# Patient Record
Sex: Female | Born: 1985
Health system: Southern US, Community
[De-identification: ages and names within clinical notes are randomized; demographics above are authoritative.]

## PROBLEM LIST (undated history)

## (undated) ENCOUNTER — Inpatient Hospital Stay (HOSPITAL_COMMUNITY): Payer: Self-pay

## (undated) DIAGNOSIS — J189 Pneumonia, unspecified organism: Secondary | ICD-10-CM

## (undated) DIAGNOSIS — G43909 Migraine, unspecified, not intractable, without status migrainosus: Secondary | ICD-10-CM

## (undated) DIAGNOSIS — B2790 Infectious mononucleosis, unspecified without complication: Secondary | ICD-10-CM

## (undated) DIAGNOSIS — Z8489 Family history of other specified conditions: Secondary | ICD-10-CM

## (undated) DIAGNOSIS — R42 Dizziness and giddiness: Secondary | ICD-10-CM

## (undated) DIAGNOSIS — M4307 Spondylolysis, lumbosacral region: Secondary | ICD-10-CM

## (undated) DIAGNOSIS — Z8619 Personal history of other infectious and parasitic diseases: Secondary | ICD-10-CM

## (undated) DIAGNOSIS — M3500 Sicca syndrome, unspecified: Secondary | ICD-10-CM

## (undated) DIAGNOSIS — K122 Cellulitis and abscess of mouth: Secondary | ICD-10-CM

## (undated) DIAGNOSIS — Z8659 Personal history of other mental and behavioral disorders: Secondary | ICD-10-CM

## (undated) DIAGNOSIS — E041 Nontoxic single thyroid nodule: Principal | ICD-10-CM

## (undated) DIAGNOSIS — S32009A Unspecified fracture of unspecified lumbar vertebra, initial encounter for closed fracture: Secondary | ICD-10-CM

## (undated) HISTORY — DX: Dizziness and giddiness: R42

## (undated) HISTORY — DX: Nontoxic single thyroid nodule: E04.1

## (undated) HISTORY — DX: Migraine, unspecified, not intractable, without status migrainosus: G43.909

## (undated) HISTORY — DX: Sicca syndrome, unspecified: M35.00

## (undated) HISTORY — DX: Personal history of other mental and behavioral disorders: Z86.59

## (undated) HISTORY — DX: Spondylolysis, lumbosacral region: M43.07

## (undated) HISTORY — DX: Personal history of other infectious and parasitic diseases: Z86.19

---
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
## 2006-01-13 ENCOUNTER — Encounter: Admission: RE | Admit: 2006-01-13 | Discharge: 2006-01-13 | Payer: Self-pay | Admitting: Sports Medicine

## 2006-03-30 HISTORY — PX: WISDOM TOOTH EXTRACTION: SHX21

## 2006-10-10 ENCOUNTER — Emergency Department (HOSPITAL_COMMUNITY): Admission: EM | Admit: 2006-10-10 | Discharge: 2006-10-10 | Payer: Self-pay | Admitting: Emergency Medicine

## 2008-04-07 ENCOUNTER — Emergency Department (HOSPITAL_COMMUNITY): Admission: EM | Admit: 2008-04-07 | Discharge: 2008-04-07 | Payer: Self-pay | Admitting: Emergency Medicine

## 2010-04-20 ENCOUNTER — Encounter: Payer: Self-pay | Admitting: Sports Medicine

## 2010-07-14 LAB — URINE MICROSCOPIC-ADD ON

## 2010-07-14 LAB — URINALYSIS, ROUTINE W REFLEX MICROSCOPIC
Bilirubin Urine: NEGATIVE
Glucose, UA: NEGATIVE mg/dL
Ketones, ur: >80 mg/dL — AB
Nitrite: POSITIVE — AB
Protein, ur: NEGATIVE mg/dL
Specific Gravity, Urine: 1.018 (ref 1.005–1.030)
Urobilinogen, UA: 0.2 mg/dL (ref 0.0–1.0)
pH: 5.5 (ref 5.0–8.0)

## 2010-07-14 LAB — URINE CULTURE: Colony Count: >=100000

## 2010-07-14 LAB — PREGNANCY, URINE: Preg Test, Ur: NEGATIVE

## 2011-01-13 LAB — DIFFERENTIAL
Basophils Absolute: 0
Basophils Relative: 0
Eosinophils Absolute: 0
Eosinophils Relative: 0
Lymphocytes Relative: 18
Lymphs Abs: 1.1
Monocytes Absolute: 0.6
Monocytes Relative: 9
Neutro Abs: 4.4
Neutrophils Relative %: 72

## 2011-01-13 LAB — CBC
HCT: 39.3
Hemoglobin: 13.8
MCHC: 35.3
MCV: 91.8
Platelets: 196
RBC: 4.28
RDW: 12.1
WBC: 6.1

## 2011-12-09 ENCOUNTER — Ambulatory Visit (INDEPENDENT_AMBULATORY_CARE_PROVIDER_SITE_OTHER): Payer: Self-pay | Admitting: Family Medicine

## 2011-12-09 ENCOUNTER — Encounter: Payer: Self-pay | Admitting: Family Medicine

## 2011-12-09 VITALS — BP 140/95 | HR 98 | Ht 68.0 in | Wt 135.0 lb

## 2011-12-09 DIAGNOSIS — F329 Major depressive disorder, single episode, unspecified: Secondary | ICD-10-CM | POA: Insufficient documentation

## 2011-12-09 DIAGNOSIS — R5383 Other fatigue: Secondary | ICD-10-CM

## 2011-12-09 DIAGNOSIS — F419 Anxiety disorder, unspecified: Secondary | ICD-10-CM

## 2011-12-09 DIAGNOSIS — F411 Generalized anxiety disorder: Secondary | ICD-10-CM

## 2011-12-09 NOTE — Progress Notes (Signed)
  Subjective:    Patient ID: Samantha Norman, female    DOB: 19-Aug-1985, 26 y.o.   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  In the past 2 weeks, has been increasingly anxious.  She attributes most of it with family conflict.  She moved back near her family in [place] in May after being away for school.  Also in May, broke up with a long term boyfriend- states being single is new for her.  In the past week, has been eating very little- she estimates 500 calories per day due to poor appetite.  Still drinking, good urine output.  Reports no trouble sleeping, energy enough to continue with school responsibilities but becomes very fatigued when she gets home.  Also endorses feelings of feeling short of breath at times.  Attributed this to poor conditioning and did intense aerobic exercise routine for a while which she had no trouble with performing, but still feels tired at times when doing simple activities such as walking up a flight of stairs.  Denies  Insomnia.  School performance has not been affected.     Review of Systems Denies SI, history of substance abuse, eating disorder, physical abuse.  I have reviewed patient's  PMH, FH, and Social history and Medications as related to this visit. No significant family history of diagnosed substance abuse or psychiatric illness    Objective:   Physical Exam GEN: Alert & Oriented, No acute distress, thin CV:  Regular Rate & Rhythm, no murmur Respiratory:  Normal work of breathing, CTAB Abd:  + BS, soft, no tenderness to palpation Ext: no pre-tibial edema Psych: anxious appearing, easily blushes, decreased eye contact.  Linear thought processes.   PHQ-9: 7,  Highest for  for poor appetite and feeling down MDQ: 1, irritability GAD-7:  7: highest for feeling nervous  or on edge      Assessment & Plan:

## 2011-12-09 NOTE — Assessment & Plan Note (Addendum)
GAD without panic attacks. Standardized questionnaire (GAD, PHQ) appears to be less severe than how she presents today.   Will check CBC and TSH to evaluate symptoms of dyspnea and fatigue, I expect to be normal.    Discussed combination of therapy and medications as beneficial and gave her options for how she would like to proceed.  She opts for therapy as she feels adding coping skills to deal with the stressors in her life would be helpful.  I agreed and also encouraged this.  We discussed setting a goal for continuing daily exercise and to bring lunch and eat some every day.  Invited her to return if she has further questions or decided would like medication therapy.

## 2011-12-09 NOTE — Patient Instructions (Addendum)
Call Dr. Pascal Lux to schedule an appointment for therapy.  Use daily exercise as stress relief  Your goal is to bring a PBJ daily to work and take at least a few bites for mid day energy  Please return to discuss any concerns!

## 2011-12-10 LAB — POCT HEMOGLOBIN: Hemoglobin: 15 g/dL (ref 12.2–16.2)

## 2011-12-10 LAB — TSH: TSH: 1.418 u[IU]/mL (ref 0.350–4.500)

## 2011-12-10 NOTE — Addendum Note (Signed)
Addended by: [place], Lovena Kluck on: 12/10/2011 05:16 PM   Modules accepted: Orders

## 2011-12-10 NOTE — Addendum Note (Signed)
Addended by: Macy Mis on: 12/10/2011 11:11 AM   Modules accepted: Orders

## 2011-12-11 ENCOUNTER — Telehealth: Payer: Self-pay | Admitting: Psychology

## 2011-12-11 NOTE — Telephone Encounter (Signed)
Samantha Norman called to request an appointment.  She needs afternoons.  First available that matched hers and my schedule was:  10/24 at 4:00.  Scheduled for then.  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  She is not concerned about anyone that might see her coming into my office.  Will discuss further at her appointment.

## 2011-12-22 ENCOUNTER — Ambulatory Visit (INDEPENDENT_AMBULATORY_CARE_PROVIDER_SITE_OTHER): Payer: PRIVATE HEALTH INSURANCE | Admitting: Psychology

## 2011-12-22 DIAGNOSIS — F4322 Adjustment disorder with anxiety: Secondary | ICD-10-CM

## 2011-12-22 NOTE — Patient Instructions (Addendum)
The main thing we discussed today (other than background) was limits and boundaries with regards to relationships.  Your homework is to make a list of ground rules for healthy relationships.  I would specifically consider the relationship with your mother as you are creating this list.

## 2011-12-23 ENCOUNTER — Encounter: Payer: Self-pay | Admitting: Psychology

## 2011-12-23 NOTE — Progress Notes (Signed)
Samantha Norman presented for an initial psychological assessment.  A client information sheet detailing the Behavioral Medicine Service was provided.  The patient voiced an understanding of what was detailed on this sheet including the issue of confidentiality and the limits thereof.  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    Presenting Problem: Samantha Norman presents with anxiety and "stress" mostly around family issues.  She is distressed to the point of her school/work function being affected.  She has a decreased appetite and increased fatigue.  She does not note sleep disturbance.  The thing that bothers her most might be difficulty with attention and concentration.    Relevant Medical History: Reviewed medical record.  She has no reported medical problems.  Relevant Psychiatric / Psychological History: Denied previous treatment of any kind.  Family History: Parents were married and had Samantha Norman (25) and her younger brother Samantha Norman, 8).  Samantha Norman is a Samantha Norman at [employer].  Parents divorced when Samantha Norman was 68.  She was a "daddy's girl" who had relationship issues with her mother, especially in her adolescent years.  The divorce did not help these issues.  Dad remarried.  Currently works in [place] but his wife lives in [place].  He splits his time.  He does not engage with Samantha Norman unless prompted by her.  He is not current on her life and does not seem to understand or care that this matters to [place].  Yoshi's mom lives in [place] and works from home.  She wants to talk to [place] everyday and would like to move with [place] if / when she changes location after graduation.    Did not assess presence of mental health issues in the family.  History of Abuse: Did not assess formally.  No reported history.  Education /  Occupation: Graduated HS in [employer].  [employer] for [employer].  In pharmacy school due to graduate with her doctorate this May. Wants a pharm residency.  Thinking about this is stressful.  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    Substance Use: Denies use of substances with the exception of social drinking one to two times a month.  When function decreased, started to drink coffee in the morning.  This boosts her mood.  Will also drink in the afternoon if she is having trouble concentrating.  If already feeling anxious, tends to make things worse.    Other: Ended six year relationship in May with Samantha Norman.  Started dating him shortly after parents' separation.  Focusing on him was a good way to not focus on her parents.  He was controlling and often put her down.  She feels good about ending the relationship.  She knows it was the right thing to do.  Her growth and development through clinical rotations was what helped give her the confidence.  Being single and not having him as a "barrier" to her dysfunctional family dynamics has proved tough for her.

## 2011-12-23 NOTE — Assessment & Plan Note (Signed)
Samantha Norman is neatly groomed and appropriately dressed.  She is cooperative and attentive.  Speech is normal in tone, rate and rhythm.  Mood is reported as "stressed" with an appropriate affect.  She blushes regularly and occasionally has trouble with eye contact but otherwise does not display significant signs of anxiety.  Thought process is logical and goal directed.  No evidence of suicidal or homicidal ideation.  Able to maintain train of thought and concentrate on the questions.  Judgment and insight are average to above.    I don't think she meets criteria for major depression or generalized anxiety disorder.  I think the closest thing in an adjustment disorder with anxious features (and maybe some depressive symptoms as well).  The big precipitants appear to be the break-up with her boyfriend (that she thinks really helped cushion her from her family dynamics) and the move to [place] where she has more contact with her mother.    Based on what she told me, the family dynamics are significantly dysfunctional with her and her brother potentially operating as the most mature and healthy individuals.  I suspect there will be a certain amount of grief from the growing realization that she is not likely to get what she needs from her parents.  There may also be some relief from understanding that it is not her fault.  This understanding may take time.  I suspect it will be important for her to do a piece of work and then take a break.  Mother-daughter issues tend to be chronic and she will need support in managing them over time.  For the time being, we decided to focus on a better understanding of what to expect in healthy, well-fucntioning relationships.    See patient instructions for further details.

## 2012-01-04 ENCOUNTER — Ambulatory Visit (INDEPENDENT_AMBULATORY_CARE_PROVIDER_SITE_OTHER): Payer: PRIVATE HEALTH INSURANCE | Admitting: Psychology

## 2012-01-04 DIAGNOSIS — F4322 Adjustment disorder with anxiety: Secondary | ICD-10-CM

## 2012-01-05 NOTE — Assessment & Plan Note (Addendum)
Mood is reported as sad and anxious.  Affect appears consistent.  Sleep is disturbed with about 5 1/2 to 6 1/2  hours per night (bed around 10:00 or 11:00 and up around 4:30).  This is a decrease and she reports not missing the sleep.  Appetite remains decreased.  She will eat when with others but not by herself.  Did not have time to explore this further.  Does not sound like she is purposefully restricting.    Despite her report of sad and anxious mood, she had a very busy weekend full of social activities.  She also reports she did almost twice her normal work today and doesn't quite understand how she was able to do it so quickly.    Denied frank suicidal ideation but it sounds like there might be some passive thoughts that are disturbing to her.  She states safety is not an issue.  I am concerned about distress tolerance.  Worked a lot with catastrophic thinking and tolerance of emotional distress.  Education about the difference between thoughts and feelings and the coping statement:  Just because it feels bad doesn't make it so.  Her interactions with her mother feel bad and therefore she thinks she [place]) is bad (worthless).  Filled out portion of thought record and asked her to use it for coping.  Other coping we identified included accepting invitations for social events.  Despite not wanting to go out with friends this past weekend, she did and she felt better.    Her report today, especially the sleep and continued eating issues, make me think more about the role of medicine.  Will continue to monitor.

## 2012-01-05 NOTE — Progress Notes (Signed)
Samantha Norman presented for follow-up.  She reports a difficult week with two particular bad times on Thursday and Sunday.  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  She stated that it wasn't about the workload but more about feeling not worthy of continuing in the program.  She spoke with Dr. Raymondo Band about it and was reassured somewhat.  Yesterday (Sunday), she had an interview with [employer] that she was dreading.  She went to the interview and started feeling really anxious.  After, she was scheduled to have dinner with her mother.  At dinner, she discussed the process for getting her mother's name removed from her bank account.  Her mother became tearful and Samantha Norman reports it was "awful."  Her mother requested to go back to [facility] house to "see the dog" and the conversation continued.  Her mother said that she plans to move to [place] - something she told Samantha Norman's brother about two weeks ago.  Samantha Norman is confused by this information (and how / when it was delivered) in multiple ways.  Samantha Norman is feeling guilty about her dog.  She is at the work about 12 hours a day and she thinks her dog is not getting enough time and attention.  She expects her work schedule to be at least this time consuming for the next two months.  She is reluctant to hire dog-care help due to cost.  The dog does bring some comfort and joy.  She thinks she would feel very sad if she were to find the dog another home but does not know for sure if that is the case.

## 2012-01-12 ENCOUNTER — Ambulatory Visit (INDEPENDENT_AMBULATORY_CARE_PROVIDER_SITE_OTHER): Payer: PRIVATE HEALTH INSURANCE | Admitting: Psychology

## 2012-01-12 DIAGNOSIS — F4322 Adjustment disorder with anxiety: Secondary | ICD-10-CM

## 2012-01-12 NOTE — Patient Instructions (Addendum)
Please schedule a follow-up for:  October 23rd at 4:00. We talked about a couple of options regarding your relationship with your mom.  Withdrawing from the relationship is an option but may not be the best starting point.  Crafting a healthier relationship with her (if possible) would likely be better in the long-term. Please consider both the pros and cons of truth-telling with her.  This means that when you get that angst about what to share - you opt for the truth.  This is not the same as telling her everything.   Also continue with your thought record.  Nice job this past week.

## 2012-01-13 NOTE — Progress Notes (Signed)
Samantha Norman presents for follow-up.  She wants to discuss how she can navigate conversations with her mother.  She also wanted to state that she thinks she has learned how to better separate out her feelings of worthlessness related to her family from her school / professional life.  She thinks she was letting these bleed together and she is working on separating these things.    We did a brief review of thoughts and feelings and she shared her one scenario from her homework.  Her thought record detailed strong anxiety about a Engineer, manufacturing.  She identified the thoughts fueling the anxiety (I am not smart enough, he is going to pick this apart) and challenged them with more realistic thoughts.  This brought her anxiety down from about a 6 to very minimal.  Detailed more conversations with her mother and the dysfunction inherent in their relationship.  Discussed options for moving forward.

## 2012-01-13 NOTE — Assessment & Plan Note (Addendum)
Her assessment that she did her homework "wrong" was interesting especially because she did it EXACTLY like it is designed to be done.  Will be watchful for a pattern here.  The tool was effective and if used steadily, should be a nice coping resource for her to have long-term.  Continues to struggle with assuming responsibility for other people's feelings (i.e. Her mother).  Discussed at length and highlighted potential spots for limits.  She is starting to recognize them herself.    She currently is keeping several things from her mother that make the relationship more tricky.  Discussed options of disengaging from her all together (seemingly attractive in the short-term but not likely a good long-term strategy).  The other option is to give her mother an opportunity for a more authentic relationship.  We discussed truth telling coupled with boundary setting.  She seemed interested in this approach and understandably wary.  Also discussed the energy that this kind of work takes and the idea that she gets to decide whether now is the time to engage.    See patient instructions for further plan.

## 2012-01-20 ENCOUNTER — Ambulatory Visit (INDEPENDENT_AMBULATORY_CARE_PROVIDER_SITE_OTHER): Payer: PRIVATE HEALTH INSURANCE | Admitting: Psychology

## 2012-01-20 DIAGNOSIS — F329 Major depressive disorder, single episode, unspecified: Secondary | ICD-10-CM

## 2012-01-20 NOTE — Patient Instructions (Addendum)
Please schedule a follow-up for: October 29th at 4:00. The basic assumption we discussed today that you need to repeat until it starts to stick is:  I am good enough.  Your mind right now is telling you all kinds of untruths about your worth.  Your mind is powerful which means you can use it to get to a better place. You identified eating more as a way to ensure that your thinking gets better.  You agreed to eat at least two meals a day and aim for 1500 calories.  A protein shake might be a way to get in additional calories.  Use your thought record around eating to address faulty cognitions. Taking a break from relating to your mother right now seems like a healthy approach to manage how you are feeling.  I am so sorry she was unable to give you what you needed. Refrain from making any major decisions right now. BE GENTLE WITH YOURSELF.  Treat yourself the way you would treat a friend who is hurting that you love very, very much.

## 2012-01-21 ENCOUNTER — Encounter: Payer: Self-pay | Admitting: Psychology

## 2012-01-21 NOTE — Progress Notes (Signed)
Samantha Norman presents for follow-up.  She reports it has been a tough week.  In addition, she just left a supervisor's office.  She had been getting frustrated and "snapped" saying something she thinks was not professional.    She had dinner with her mother again and although she wasn't really intending to, she ending up doing some truth telling.  She reported that this did not go well.  She kept a thought record on the situation and her feelings and we reviewed this.    She reported she continues to lose weight.  She thinks she has lost about 15 pounds since September.  She is 125 pounds and is 5'7".  She has looked up her BMI and she is still within the normal range but close to being underweight.  She reports her weight loss is a combination of not feeling like eating and feeling like not eating is a way to feel in control.  She does not think she needs to lose weight.  She does believe that her lack of nutrition is contributing to her distorted thinking and emotional reactivity.    Sleep remains disturbed.  Feels like she hasn't slept well in a long time.  Sleep requirement is around 7-8 and she is getting significantly less.  She awakens frequently during the night and then is usually up around 4:30 for good.    Denies active suicidal ideation, intent or plan.  Passive thoughts are present.  No history of suicide attempts.

## 2012-01-21 NOTE — Assessment & Plan Note (Signed)
Original diagnosis was adjustment disorder.  Symptoms are more consistent with major depressive disorder at this point.  She has loss of appetite with significant weight loss, early morning awakenings, feelings of guilt and worthlessness, impaired concentration, sad mood and passive suicidal ideation.   This would be the first episode for her - no prior treatment history.  No substance abuse issues.  No clear signs of mania or hypomania.  She continues to do fine at work but from her report, she is likely functioning at a level lower than her baseline.  TSH normal in 11/2011.  At this point, medication might be helpful as an aid to treatment.  Will discuss with [name].  Eating issues are concerning because in addition of the loss of appetite, Victory recognizes power that comes from actively restricting.  Opted to give her as much power around this issue as possible.  See patient instructions for what she was willing to do with regards to eating.    Of note, we brainstormed how to handle the situation with her supervisor during our session.  She was able to enact the plan immediately.  She was concerned she would think about it too much if she did not resolve it.  It was a nice opportunity to work at the point of performance rather than a historical report.  Also of note, based on Karthika's report of her mother's behavior, I do not think her mother will be able to give Amnah what she needs (and deserves).  The communication and relationship dynamic sounds very dysfunctional and will likely prove hard for Neda to manage given her current state.  Jaquasia thought that "taking a break" from her bother was a reasonable and healthy approach to taking care of herself.  I agree.  See patient instructions for further plan.

## 2012-01-26 ENCOUNTER — Ambulatory Visit (INDEPENDENT_AMBULATORY_CARE_PROVIDER_SITE_OTHER): Payer: PRIVATE HEALTH INSURANCE | Admitting: Psychology

## 2012-01-26 DIAGNOSIS — F329 Major depressive disorder, single episode, unspecified: Secondary | ICD-10-CM

## 2012-01-26 NOTE — Progress Notes (Deleted)
Psychiatric Assessment Adult  Patient Identification:  Samantha Norman Date of Evaluation:  01/26/2012 Chief Complaint: *** History of Chief Complaint:  No chief complaint on file.   HPI Review of Systems Physical Exam  Depressive Symptoms: {DEPRESSION SYMPTOMS:20000}  (Hypo) Manic Symptoms:   Elevated Mood:  {BHH YES OR NO:22294} Irritable Mood:  {BHH YES OR NO:22294} Grandiosity:  {BHH YES OR NO:22294} Distractibility:  {BHH YES OR NO:22294} Labiality of Mood:  {BHH YES OR NO:22294} Delusions:  {BHH YES OR NO:22294} Hallucinations:  {BHH YES OR NO:22294} Impulsivity:  {BHH YES OR NO:22294} Sexually Inappropriate Behavior:  {BHH YES OR NO:22294} Financial Extravagance:  {BHH YES OR NO:22294} Flight of Ideas:  {BHH YES OR NO:22294}  Anxiety Symptoms: Excessive Worry:  {BHH YES OR NO:22294} Panic Symptoms:  {BHH YES OR NO:22294} Agoraphobia:  {BHH YES OR NO:22294} Obsessive Compulsive: {BHH YES OR NO:22294}  Symptoms: {Obsessive Compulsive Symptoms:22671} Specific Phobias:  {BHH YES OR NO:22294} Social Anxiety:  {BHH YES OR NO:22294}  Psychotic Symptoms:  Hallucinations: {BHH YES OR NO:22294} {Hallucinations:22672} Delusions:  {BHH YES OR NO:22294} Paranoia:  {BHH YES OR NO:22294}   Ideas of Reference:  {BHH YES OR NO:22294}  PTSD Symptoms: Ever had a traumatic exposure:  {BHH YES OR NO:22294} Had a traumatic exposure in the last month:  {BHH YES OR NO:22294} Re-experiencing: {BHH YES OR NO:22294} {Re-experiencing:22673} Hypervigilance:  {BHH YES OR NO:22294} Hyperarousal: {BHH YES OR NO:22294} {Hyperarousal:22674} Avoidance: {BHH YES OR NO:22294} {Avoidance:22675}  Traumatic Brain Injury: {BHH YES OR NO:22294} {Traumatic Brain Injury:22676}  Past Psychiatric History: Diagnosis: ***  Hospitalizations: ***  Outpatient Care: ***  Substance Abuse Care: ***  Self-Mutilation: ***  Suicidal Attempts: ***  Violent Behaviors: ***   Past Medical History:  No past  medical history on file. History of Loss of Consciousness:  {BHH YES OR NO:22294} Seizure History:  {BHH YES OR NO:22294} Cardiac History:  {BHH YES OR NO:22294} Allergies:  No Known Allergies Current Medications:  Current Outpatient Prescriptions  Medication Sig Dispense Refill  . norethindrone-ethinyl estradiol 1/35 (NECON 1/35, 28,) tablet Take 1 tablet by mouth daily.        Previous Psychotropic Medications:  Medication Dose   ***  ***                     Substance Abuse History in the last 12 months: Substance Age of 1st Use Last Use Amount Specific Type  Nicotine  ***  ***  ***  ***  Alcohol  ***  ***  ***  ***  Cannabis  ***  ***  ***  ***  Opiates  ***  ***  ***  ***  Cocaine  ***  ***  ***  ***  Methamphetamines  ***  ***  ***  ***  LSD  ***  ***  ***  ***  Ecstasy  ***   ***  ***  ***  Benzodiazepines  ***  ***  ***  ***  Caffeine  ***  ***  ***  ***  Inhalants  ***  ***  ***  ***  Others:                          Medical Consequences of Substance Abuse: ***  Legal Consequences of Substance Abuse: ***  Family Consequences of Substance Abuse: ***  Blackouts:  {BHH YES OR NO:22294} DT's:  {BHH YES OR NO:22294} Withdrawal Symptoms:  {BHH YES OR NO:22294} {Withdrawal Symptoms:22677}  Social History: Current Place  of Residence: *** Place of Birth: *** Family Members: *** Marital Status:  {Marital Status:22678} Children: ***  Sons: ***  Daughters: *** Relationships: *** Education:  {Education:22679} Educational Problems/Performance: *** Religious Beliefs/Practices: *** History of Abuse: {Desc; abuse:16542} Occupational Experiences; Military History:  {Military History:22680} Legal History: *** Hobbies/Interests: ***  Family History:  No family history on file.  Mental Status Examination/Evaluation: Objective:  Appearance: {Appearance:22683}  Eye Contact::  {BHH EYE CONTACT:22684}  Speech:  {Speech:22685}  Volume:  {Volume (PAA):22686}    Mood:  ***  Affect:  {Affect (PAA):22687}  Thought Process:  {Thought Process (PAA):22688}  Orientation:  {BHH ORIENTATION (PAA):22689}  Thought Content:  {Thought Content:22690}  Suicidal Thoughts:  {ST/HT (PAA):22692}  Homicidal Thoughts:  {ST/HT (PAA):22692}  Judgement:  {Judgement (PAA):22694}  Insight:  {Insight (PAA):22695}  Psychomotor Activity:  {Psychomotor (PAA):22696}  Akathisia:  {BHH YES OR NO:22294}  Handed:  {Handed:22697}  AIMS (if indicated):  ***  Assets:  {Assets (PAA):22698}    Laboratory/X-Ray Psychological Evaluation(s)   ***  ***   Assessment:  {axis diagnosis:3049000}  AXIS I {psych axis 1:31909}  AXIS II {psych axis 2:31910}  AXIS III No past medical history on file.   AXIS IV {psych axis iv:31915}  AXIS V {psych axis v score:31919}   Treatment Plan/Recommendations:  Plan of Care: ***  Laboratory:  {Laboratory:22682}  Psychotherapy: ***  Medications: ***  Routine PRN Medications:  {BHH YES OR RU:04540}  Consultations: ***  Safety Concerns:  ***  OtherSpero Geralds, PsyD 10/29/20135:06 PM

## 2012-01-26 NOTE — Patient Instructions (Addendum)
Please schedule a follow-up for 12:00.  There will not be a 12:00 slot in the computer - they will have to put you in at a different time.   You agree that eating more regularly will help your brain function more properly.  There are lots of reasons you are not eating well now.  Taking the thinking out of it might be a good stategy.  Consider setting an alarm as a reminder.  Use a thought record to challenge the unhealthy, inaccurate thoughts you have around eating right now. We talked a lot about medicine.  Depressive cognitions are part of the deal and you have a bunch of really good ones.  Medicine would likely help clear up your thinking so that you could get to feeling more yourself faster.  This would be a good thing.

## 2012-01-26 NOTE — Assessment & Plan Note (Signed)
Report of mood is variable - more positive today but would say more than half the days are low mood.  Thoughts are consistent with depression.  Passive suicidal ideation is present.  No active SI.  Eating and sleep remain disturbed.  Attempted to stay rooted in motivational interviewing.  Did recommend medication as a way to clear up thoughts that would hopefully aid eating behavior as this is likely contributing to the distorted thinking.  Also looked to see what she could recommit to with regards to food intake.  See patient instructions for further plan.

## 2012-01-26 NOTE — Progress Notes (Signed)
Samantha Norman presents for follow-up.  The majority of the meeting was spent on discussing symptomatology and the pros and cons of initiating medication.  She is ambivalent.  Discussed thoughts behind this.  Weight remains an issue.  She was unable to follow through on her identified plan from last week.  She doesn't feel like eating but in addition to that, she has several faulty cognitions around this issue.  Had another meeting with her mom (mom initiated).  [place] has determined that it is best to steer clear for now.

## 2012-01-27 ENCOUNTER — Other Ambulatory Visit: Payer: Self-pay | Admitting: Family Medicine

## 2012-01-27 MED ORDER — SERTRALINE HCL 50 MG PO TABS: 50.0000 mg | ORAL_TABLET | Freq: Every day | ORAL | Status: DC

## 2012-01-27 NOTE — Telephone Encounter (Signed)
I discussed with Dr Pascal Lux. On the basis of Dr Briscoe's assessment I am starting Zoloft with follow up already scheduled with Dr Gwendolyn Grant next week. If this decreases appetite, Mirtazapine may be better due to her current anorexia. Hand written Rx since it didn't print.

## 2012-02-01 ENCOUNTER — Ambulatory Visit (INDEPENDENT_AMBULATORY_CARE_PROVIDER_SITE_OTHER): Payer: PRIVATE HEALTH INSURANCE | Admitting: Family Medicine

## 2012-02-01 ENCOUNTER — Encounter: Payer: Self-pay | Admitting: Family Medicine

## 2012-02-01 VITALS — BP 129/90 | HR 85 | Ht 68.0 in | Wt 122.0 lb

## 2012-02-01 DIAGNOSIS — F329 Major depressive disorder, single episode, unspecified: Secondary | ICD-10-CM

## 2012-02-01 NOTE — Patient Instructions (Addendum)
It was good to meet you today, thanks for coming in and talking with me.  As we discussed, I would continue with the Zoloft for the next couple of weeks to see how you do as long as the nausea and anxiety do not significantly worsen.    If you are having anxiety issues or feeling overwhelmed and cannot reach [facility] or Dr. Pascal Lux, you can also call me.  My number is [phone]. Come back and see either me or Dr. Earnest Bailey in about 10 days.

## 2012-02-02 ENCOUNTER — Encounter: Payer: Self-pay | Admitting: Family Medicine

## 2012-02-02 ENCOUNTER — Ambulatory Visit (INDEPENDENT_AMBULATORY_CARE_PROVIDER_SITE_OTHER): Payer: PRIVATE HEALTH INSURANCE | Admitting: Psychology

## 2012-02-02 DIAGNOSIS — F329 Major depressive disorder, single episode, unspecified: Secondary | ICD-10-CM

## 2012-02-02 NOTE — Assessment & Plan Note (Addendum)
Sounds like significant mood lability remains.  She is composed with me today.  Evident cognitive errors including all-or-none thinking.  Function remains reasonable although her work function is not as high as it normally is.  She is passing rotations however.  The structure work provides is likely helpful.  I think she has the potential to deteriorate further if not working.  She agreed with this.  Mood is reported as "better."  No evidence of SI / HI.    See patient instructions for further plan.  Also gave her a bibliotherapy resource on DBT.  Asked her to read the first chapter which is about distress tolerance through distraction and engaging in pleasurable activities.  There is also a piece in there about radical acceptance.

## 2012-02-02 NOTE — Patient Instructions (Addendum)
Please schedule a follow-up for: November 12th at 4:00. We talked about sleep, eating, structure and coping.   With regards to sleep - you agreed to practice sleep hygiene principles by getting out of bed when you are unable to sleep and reading in the other room until you feel sleepy.   With regards to eating - we talked about power and how you feel as you are losing weight.  You agreed to try external cues for eating (times of day) rather than  "wanting to eat" until your thinking gets a little clearer around this issue.  I am concerned about the direction you are heading and where you might end up.   Structure is good for people that are struggling. Work on a Environmental health practitioner for December.  Creating a calendar with activities for each day (not the entire day) is a good starting point.  A mix of pleasure stuff (even if it doesn't sound pleasurable) and productive stuff can be helpful. Coping.  You have the tools you need in order to manage your emotional distress.  Relying on others to rescue you might feel good in the short-term but it robs you of the opportunity to rely on yourself.  Social support is good.  Knowing you can cope is really good.  Thought record.  Thought record.  Thought record.  Feel free to email me your thought record if you think accountability would be helpful.

## 2012-02-02 NOTE — Progress Notes (Signed)
  Subjective:    Patient ID: Samantha Norman, female    DOB: 1985-05-06, 26 y.o.   MRN: [id number]  HPI  1.  FU for depression and adjustment disorder:  26 yo F with multiple recent life stressors including ending relationship with long-term boyfriend (6 years) and recently cutting herself off from her family who presents today for follow-up after starting anti-depressant medications.  Samantha Norman has been followed by both Dr. Raymondo Band -- her supervising pharmacist -- and Dr. Pascal Lux for adjustment disorder which seems to have become her first episode of major depression.  Main symptoms have been decreased eating and decreased sleep.  States she has not felt hungry which is main reason for not eating.  Able to fall asleep but awakens early (around 3:30 AM with difficulty falling back asleep).  She was recommended to start on Zoloft on Wednesday of last week.  She states this caught her by surprise as she had wanted to try and work through things on her own without medications.  However she started the Zoloft that day.  Began experiencing increased anxiety and nausea since starting Zoloft.  Had "major episode" of crying and anxiety on Thursday and again on Saturday. Has been doing better yesterday and today from mood perspective, but has had increasing nausea without any overt vomiting. This also now limiting her po food intake.  Good fluid intake on daily basis.  Able to eat a protein bar today, but nothing else.    Not much support system.  She admits that Drs. Pascal Lux and [place] and now her main supports.  Feels she is a "burden" on them as well as a burden on her long-term friends.  Feelings of being burden and lack of any real support since loss of boyfriend and no longer seeing her family seems to be another major stressor for her.    Denies any manic/hypomanic symptoms or any history of these.  No suicidal ideations.    Review of Systems See HPI above for review of systems.       Objective:   Physical  Exam Gen:  Alert, cooperative patient who appears stated age in no acute distress.  Well-dressed, not disheveled appearing.  Vital signs reviewed. Psych:  Depressed appearing at times, but also able to smile.  Linear and coherent thought process.  Not anxious appearing.          Assessment & Plan:

## 2012-02-02 NOTE — Assessment & Plan Note (Addendum)
Long discussion about where to go from here.   She reiterated that she wished she had been able to try getting through this episode without medications, but after much discussion and thinking, she is willing to try continuing the Sertraline for now. Less excited about switching to something to help with sleep/weight gain like Mirtazapine -- she feels that she did not have trouble sleeping or eating prior to this episode and that her depressive symptoms have triggered the lack of sleep/appetite and not vice versa.  Also feels that once she works through these symptoms her eating/sleeping with revert to what is normal for her.   Does admit to realization that decreased food/energy intake can limit cognitive decision making.   She seems to have had some worsening anxiety with initiation of Zoloft but this seems to be resolving.  She would like to continue at 25 mg Zoloft (dose cut in half after speaking with Dr. Raymondo Band last week) unless anxiety returns or nausea limits usage.   Also will work on increasing food intake with supplementations such as protein shakes/bars.   She has follow up with Dr. Pascal Lux tomorrow. FU with me or Dr. Earnest Bailey if she is more comfortable with female in about 10 days, which would be when she should start to notice effects of Sertraline and if it's really helping.  Provided her my cellphone number as lack of support seems to be another major stressor on her and she feels she is burdening Drs. Raymondo Band and Pascal Lux.

## 2012-02-02 NOTE — Progress Notes (Signed)
Samantha Norman presents for follow-up.  She had a really tough Thursday where she got a Pass on a rotation and had thoughts about quitting school.  Both Dr. Raymondo Band and I were out of the office and this allowed her to feel even more overwhelmed.  She eventually calmed down later that night.  She remains ambivalent about taking medication but says she is committed to it.    Continues to struggle with PO intake with multiple factors involved.  She is having nausea that she thinks is related to the medication.  She continues with ambivalence over how big an issue this is.  She says she is "shocked" that she has lost that much weight.  She does not want to return to her previous weight but thinks that the lack of intake is compromising her ability to think clearly.  Sleep remains an issue.  Last night she was up at 1:30 and ready to go.  Stayed in bed and eventually fell back asleep late morning.    She wonders whether she should take the month of November off of rotations to try to get to a better place.  Discussed.

## 2012-02-09 ENCOUNTER — Ambulatory Visit (INDEPENDENT_AMBULATORY_CARE_PROVIDER_SITE_OTHER): Payer: PRIVATE HEALTH INSURANCE | Admitting: Psychology

## 2012-02-09 DIAGNOSIS — F329 Major depressive disorder, single episode, unspecified: Secondary | ICD-10-CM

## 2012-02-09 NOTE — Progress Notes (Signed)
Samantha Norman presents for follow-up.  She reports she is doing better with her eating but continues to have pains and nausea when she eats.    Sleep remains disrupted.  She reports she is ready to fall asleep around 6:30 and did go to bed twice this last week.  She tossed and turned but got closer to 8 hours of sleep.  If she pushes through this 6:30 fatigue and goes to bed at a more normal time (10:00), she is wide awake.  She is using some sleep hygiene strategies.  Focused on relationship issues with all three members of her family.  Worked through a text her dad sent her, an email her mom sent her and some conversations with her brother.

## 2012-02-09 NOTE — Patient Instructions (Addendum)
Please schedule a follow-up for:  November 18th at 4:00. Please discuss both stomach and sleep issues with Dr. Earnest Bailey.  I think your stomach pain / nausea could be coming from anxiety / stress (about eating and other things) as well as the result of not eating for so long and restarting.  She may have some thoughts as to how to best manage. Having a plan for Thanksgiving will be important.  I rarely give hard and fast advice.  Reconnecting with your mom ON Thanksgiving sounds like a really bad idea. Continue with sleep hygiene and THOUGHT RECORD - even after the fact can be useful. Boundaries with brother - we talked about what could be helpful to hear from him and what is decidedly not.  You may be interested to hear what your mother is saying to him.  Just because you are, doesn't mean it is healthy.

## 2012-02-09 NOTE — Assessment & Plan Note (Signed)
Overall, it seems like she is having more emotional control.  She thinks the eating has helped clear up her thinking (less foggy) but she thinks the thoughts remain negative.  Feelings of guilt and confusion about boundaries remain.  The thought record is an excellent tool for checking these thoughts and training her brain to not let negative, hurtful messages take root.  Discussed barriers to using this.  Therapy is helpful in helping to manage this as well but applying the skill outside of the therapy environment is ultimately most useful.    Taking 50 mg of Zoloft.  Tolerance remains an issue.  Safety is not currently an issue.  Efficacy is questionable.  Hard to say if the medicine is helping with what appears to be better emotional control or the increased caloric intake.    She got through half-the chapter in the DBT book and will bring next visit. Would be nice to review some skills but she has much to say in the therapy appointment.  See patient instructions for further information.

## 2012-02-12 ENCOUNTER — Encounter: Payer: Self-pay | Admitting: Family Medicine

## 2012-02-12 ENCOUNTER — Ambulatory Visit (INDEPENDENT_AMBULATORY_CARE_PROVIDER_SITE_OTHER): Payer: PRIVATE HEALTH INSURANCE | Admitting: Family Medicine

## 2012-02-12 VITALS — BP 126/88 | HR 76 | Ht 68.0 in | Wt 123.6 lb

## 2012-02-12 DIAGNOSIS — F329 Major depressive disorder, single episode, unspecified: Secondary | ICD-10-CM

## 2012-02-12 MED ORDER — TRAZODONE HCL 50 MG PO TABS: 25.0000 mg | ORAL_TABLET | Freq: Every day | ORAL | Status: DC

## 2012-02-12 MED ORDER — SERTRALINE HCL 100 MG PO TABS: 100.0000 mg | ORAL_TABLET | Freq: Every day | ORAL | Status: DC

## 2012-02-12 NOTE — Assessment & Plan Note (Signed)
On sertraline for 2 weeks, no significant change in symptoms.  Will increase to 100 mg daily.  Also trazodone 25-50 mg qhs prn sleep.  Patient will continue to follow-up with psychologist- next appt in 3 days.  Follow-up for medication management in 2-3 weeks.

## 2012-02-12 NOTE — Progress Notes (Signed)
  Subjective:    Patient ID: Samantha Norman, female    DOB: Apr 26, 1985, 26 y.o.   MRN: [id number]  HPI 26 yo with depression and anxiety here for follow-up.  Was started on sertraline about 2 weeks ago.  Initially has nausea but now resolved.  Continues to have some nausea but only after eating- does not seem to be related to medicine.  Has been in ongoing therapy- feels it has been helpful, but overall feels symptoms may be worsening.  Reports weight loss, loss of appetite, apathy, denies sadness.   Difficulty with sleep onset as well as duration due to worries.  Continued difficulty with nausea and abdominal discomfort with eating   Able to drink coffee without problems.  Patient acknowledges symptoms may not be physiologic.  Has also spoken casually  with nutritionist.  Review of Systems See HPI    Objective:   Physical Exam GENl NAD Psych: anxious appearing.       Assessment & Plan:

## 2012-02-12 NOTE — Patient Instructions (Addendum)
Increase sertraline from 50 mg daily to 100 mg  Use trazodone one half to one tablet nightly for sleep  Follow-up in 2-3 weeks

## 2012-02-15 ENCOUNTER — Ambulatory Visit (INDEPENDENT_AMBULATORY_CARE_PROVIDER_SITE_OTHER): Payer: PRIVATE HEALTH INSURANCE | Admitting: Psychology

## 2012-02-15 DIAGNOSIS — F329 Major depressive disorder, single episode, unspecified: Secondary | ICD-10-CM

## 2012-02-16 NOTE — Assessment & Plan Note (Addendum)
She reports a blunted affect - not reacting emotionally to things that she normally would.  Objectively, she displays a normal range that includes smiling and laughing.  She is not tearful.   Speech is a little accelerated (she says secondary to caffeine).  Normal otherwise.  Thoughts today are very reasonable.  Her assessment of how she handled the experience with her mother seems very healthy and well-boundaried.  I am not sure what the sleep issues are about.  She denies difficulty prior to this mood episode.  We have reviewed sleep hygiene and she is taking a medication to promote sleep.  Will stay the course, try to minimize further difficulty and hope for improvement with pharmacotherapy.  Caffeine may be playing a role but I don't have a strong flavor for this.  With regards to mood, it is hard to say.  Her description sounds a little like the "flatness" some people report on antidepressants.  On the other hand, it does not seem to be interfering with her function and it is a step away from the mood lability that was having a negative impact.  Additionally, she has just been on 50 mg of Zoloft - this apathy may be a function of her depression which has yet to be adequately treated.  Discussed options.  She thinks staying the coruse in this regard is reasonable as well.  Will follow in one week or as needed.  Reviewed thought record and specifically how to address apathy.  Digging deeper to get at more core thoughts or deciding whether thoughts are helpful or not were two things discussed.

## 2012-02-16 NOTE — Progress Notes (Signed)
Samantha Norman presents for follow-up.  She recently had an increase in Zoloft to 100 mg.  Also has taken Trazodone 50 mg on a few occasions.  Sleep remains poor despite Ambien.  Her bed times seem somewhat erratic.  Middle of the night awakening (e.g. 1:45) is still very typical.  Drinking more coffee to compensate.    She has reports on interactions with her mother and step-mother.  She feels relieved / slightly happy with the outcome and believes that she handled both situations well.  She has an especially positive view of the boundaries she set with her mother.  Also reviewed her thought record around a work incident where she didn't feel like being at work or doing work and wanted to go home.  She ended up staying.    She notes "not caring" much about anything.  She hasn't gotten excited or upset about things that she normally would.  This bothers her largely because "it isn't [her]" and because she is worried about what impact the "lack of caring" will have on her education.  So far, she has yet to receive any negative feedback and continues to perform well.  She is planning on spending her month off of school doing projects around the [facility].

## 2012-02-23 ENCOUNTER — Ambulatory Visit (INDEPENDENT_AMBULATORY_CARE_PROVIDER_SITE_OTHER): Payer: PRIVATE HEALTH INSURANCE | Admitting: Psychology

## 2012-02-23 DIAGNOSIS — F329 Major depressive disorder, single episode, unspecified: Secondary | ICD-10-CM

## 2012-02-24 NOTE — Assessment & Plan Note (Signed)
Report of mood is euthymic.  Affect is consistent.  Thoughts are clear and goal directed although she reports feeling "drugged" which she thinks might be related to fatigue and lack of nutrition.  She thinks that overall, her mood has improved and objectively, she seems more stable right now.  This is a very tough time of year, especially for people with family issues.  Coupled with this, it is her first year without a significant other.  Many changes and from my point of view, she seems to be weathering them reasonably well.    The biggest issue presently remains the eating.  Discussed helpful (structure) and not helpful (peers trying to get her to eat) things she has noticed.  I encouraged her to use her food record to remind herself the reasons she might not be eating (fear of gaining weight, control/power, I don't deserve to eat) and to challenge them with a healthier statement such as:  I need to treat my body with kindness and respect if I want it to work well for me.  Having control over my eating means making good choices.  I can be powerful in ways that are not damaging to my mind and body.    Will follow next week.  She plans to follow up with Dr. Earnest Bailey as well.

## 2012-02-24 NOTE — Progress Notes (Signed)
Samantha Norman presented for follow-up.  She reports no other contact with her mother but a situation with her father in which she was struggling.  He texted her information that she thought was best conveyed by a phone call or in person.  Discussed.  Reports no significantly bothersome depressed mood over the past week.  Some irritability.  Reports no bothersome periods of anxiety, worry or fear.  Has noticed feelings of happiness and enjoyment.  Continues to be troubled by the absence of emotion in response to complicated family issues.  For instance - why doesn't she feel more sad or guilty about x,y or z.  It doesn't feel like her to her although she acknowledges that it is nice not to have these strong, negative emotions.    Eating and sleeping remain issues.  Her sleep pattern continues to be erratic.  She reports falling asleep one night at 8:00 and getting up at 11:30 and then going back to sleep until 6:00 a.m.  Other nights she is getting only 3-4 hours of sleep.  She stopped drinking coffee all together.  Discussed eating issues at length.  She reports she does better around other people because their structured eating times and the social pressure to eat means that she does indeed eat.  Issues present include the control / power she feels (she talked about how this relates to her mother as well) and to a lesser degree, this sense that she doesn't deserve to feed her body.  She thinks that the headache she has been having recently might be related to a cumulative effect of lack of enough food and sleep issues.

## 2012-03-01 ENCOUNTER — Encounter: Payer: Self-pay | Admitting: Family Medicine

## 2012-03-01 ENCOUNTER — Ambulatory Visit (INDEPENDENT_AMBULATORY_CARE_PROVIDER_SITE_OTHER): Payer: PRIVATE HEALTH INSURANCE | Admitting: Family Medicine

## 2012-03-01 VITALS — BP 120/82 | HR 65 | Temp 98.1°F | Ht 68.5 in | Wt 123.0 lb

## 2012-03-01 DIAGNOSIS — F329 Major depressive disorder, single episode, unspecified: Secondary | ICD-10-CM

## 2012-03-01 MED ORDER — NORETHINDRONE-ETH ESTRADIOL 1-35 MG-MCG PO TABS: 1.0000 | ORAL_TABLET | Freq: Every day | ORAL | Status: DC

## 2012-03-01 MED ORDER — SERTRALINE HCL 100 MG PO TABS: 100.0000 mg | ORAL_TABLET | Freq: Every day | ORAL | Status: DC

## 2012-03-01 MED ORDER — TRAZODONE HCL 50 MG PO TABS: 50.0000 mg | ORAL_TABLET | Freq: Every day | ORAL | Status: DC

## 2012-03-01 NOTE — Patient Instructions (Addendum)
Im glad you're doing better  Follow-up in 4-6 weeks and we'll reassess need to increase sertraline.  If you feel like we should address this sooner or have new concerns, please schedule appointment sooner.

## 2012-03-01 NOTE — Progress Notes (Signed)
  Subjective:    Patient ID: Samantha Norman, female    DOB: April 08, 1985, 26 y.o.   MRN: [id number]  HPI 26 yo here for follow-up of depression  At last appointment increaed sertraline from 50 to 100 mg and started trazodone for insomnia.  Reports tolerating sertraline well, no further GI upset.  Doing well on trazodone 50 mg- helps her sleep and feel rested but not groggy in the morning.  In the past week has been having headaches.  Not sure if related to medicine.  Has quit coffee 4-5 days prior to headaches starting.   Overall eating improved with resolution of pain.    Review of Systems See HPI    Objective:   Physical Exam GEN: NAD, well groomed Psych: alert, oriented, affect, flat but somewhat brighter.       Assessment & Plan:

## 2012-03-01 NOTE — Assessment & Plan Note (Signed)
Will continue same dose of sertraline and trazodone. Affects improved.  It is possible trazodone is causing headaches but also may be due to changes in diet as well.  Will continue to track symptoms, as she feel sthat trazodone overall is helping quality of life.  Will follow-up in 4-6 weeks to reassess need to increase sertraline, trazodone effects.  Invited her to follow-up sooner if would like to discuss changes sooner.  Encouraged by her improved eating habits,  Will continue to monitor weight.  Discussed duration of treatment goal at least 6-12 months, will reassess closer to that time.

## 2012-03-02 ENCOUNTER — Other Ambulatory Visit: Payer: Self-pay | Admitting: Family Medicine

## 2012-03-02 MED ORDER — NORETHINDRONE-ETH ESTRADIOL 1-35 MG-MCG PO TABS: 1.0000 | ORAL_TABLET | Freq: Every day | ORAL | Status: DC

## 2012-03-03 ENCOUNTER — Ambulatory Visit (INDEPENDENT_AMBULATORY_CARE_PROVIDER_SITE_OTHER): Payer: PRIVATE HEALTH INSURANCE | Admitting: Psychology

## 2012-03-03 DIAGNOSIS — F329 Major depressive disorder, single episode, unspecified: Secondary | ICD-10-CM
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
## 2012-03-03 NOTE — Assessment & Plan Note (Signed)
Mood is reported as sad.  Not sure what this is about.  Might be hormonal fluctuations although denies PMS symptoms other than irritability in the past.  Her affect had a normal range.  I am impressed with the progress she has made regarding her family.  It seems to me that her thoughts and feelings regarding these relationships are healthy and this is saying something given the dynamics.  Discussed warning signs that she was not managing this as well any longer.  Her sharing her experiences with me today shed some light on several things including, possibly, her eating issues.  Will continue to process this as it will surely affect relationships moving forward.    Follow-up on 12/18 at 4:00.  She heads to [place] next week for a [employer].

## 2012-03-16 ENCOUNTER — Ambulatory Visit (INDEPENDENT_AMBULATORY_CARE_PROVIDER_SITE_OTHER): Payer: PRIVATE HEALTH INSURANCE | Admitting: Psychology

## 2012-03-16 DIAGNOSIS — F329 Major depressive disorder, single episode, unspecified: Secondary | ICD-10-CM

## 2012-03-17 NOTE — Progress Notes (Signed)
Samantha Norman presents for follow-up.  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  She has had some tearful spells and some isolated periods of 12 hour sleep.  This is a switch from the insomnia she was experiencing earlier.  There does not seem to be any clear pattern.  Work function remains good.  Saw her mom and brother and she managed the situation.

## 2012-03-17 NOTE — Assessment & Plan Note (Signed)
Mood is not depressed.  Affect is within normal limits.  Some anxiety that is situationally specific.  Function remains good.    Discussed internalizing voices that are helpful to her (Dr. Macky Lower and mine) especially over the holidays when this support is not readily available.  Also discussed use of thought record.    In terms of transient crying spells and mild to moderate but brief shifts in mood / affect, talked about noticing without judging or over interpreting.  Variation is a normal part of the emotional experience.  RTC on December 27th at 10:00.

## 2012-03-28 ENCOUNTER — Ambulatory Visit (INDEPENDENT_AMBULATORY_CARE_PROVIDER_SITE_OTHER): Payer: PRIVATE HEALTH INSURANCE | Admitting: Psychology

## 2012-03-28 DIAGNOSIS — F329 Major depressive disorder, single episode, unspecified: Secondary | ICD-10-CM

## 2012-03-28 NOTE — Assessment & Plan Note (Signed)
Mood is reported as stable - euthymic.  Affect is consistent today.  She is anxious but feels this is appropriate to the situation and in keeping with her personality.  Historically, high anxiety has disrupted sleep and appetite.  She feels good about the work she has done regarding the family relationships.  She has identified romantic relationships as being a major issue for her.  They are not a priority for her right now.  Discussed how therapy might be useful in the future.  Also discussed ways to manage men's interest in her in the meantime.  When she sets appropriate (kind) limits, she still ends up feeling ashamed.  Worked on this today.  I think her approach to this represents mature, rational thinking.    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  Discussed ways for her to incorporate something else that might also help manage stress.  She has done yoga in the past but didn't like it because she is not very flexible and she got frustrated.  Discussed the concept of acceptance and specifically how it relates to meditation.  Perfect ground for her to work through some of the critical voices in her head.  Also discussed other possibilities - pottery, cooking.  She is going to look into it and report back.

## 2012-03-28 NOTE — Progress Notes (Signed)
Samantha Norman presents for follow-up.  The holiday was tricky but she managed family dynamics well.  Reading the book called Quiet and learning that not only is there nothing wrong with her personality, it offers up strengths.  Very stressed about pharm residency applications.  Recognizes that her peers are in a similar place (although she suspects she takes it up a notch).  Sleep and eating continue to be affected.  Feeling exhausted.  Working to not take naps in the evening.  Discussed relationships as well.

## 2012-03-28 NOTE — Progress Notes (Deleted)
Psychiatric Assessment Adult  Patient Identification:  Samantha Norman Date of Evaluation:  03/28/2012 Chief Complaint: *** History of Chief Complaint:  No chief complaint on file.   HPI Review of Systems Physical Exam  Depressive Symptoms: {DEPRESSION SYMPTOMS:20000}  (Hypo) Manic Symptoms:   Elevated Mood:  {BHH YES OR NO:22294} Irritable Mood:  {BHH YES OR NO:22294} Grandiosity:  {BHH YES OR NO:22294} Distractibility:  {BHH YES OR NO:22294} Labiality of Mood:  {BHH YES OR NO:22294} Delusions:  {BHH YES OR NO:22294} Hallucinations:  {BHH YES OR NO:22294} Impulsivity:  {BHH YES OR NO:22294} Sexually Inappropriate Behavior:  {BHH YES OR NO:22294} Financial Extravagance:  {BHH YES OR NO:22294} Flight of Ideas:  {BHH YES OR NO:22294}  Anxiety Symptoms: Excessive Worry:  {BHH YES OR NO:22294} Panic Symptoms:  {BHH YES OR NO:22294} Agoraphobia:  {BHH YES OR NO:22294} Obsessive Compulsive: {BHH YES OR NO:22294}  Symptoms: {Obsessive Compulsive Symptoms:22671} Specific Phobias:  {BHH YES OR NO:22294} Social Anxiety:  {BHH YES OR NO:22294}  Psychotic Symptoms:  Hallucinations: {BHH YES OR NO:22294} {Hallucinations:22672} Delusions:  {BHH YES OR NO:22294} Paranoia:  {BHH YES OR NO:22294}   Ideas of Reference:  {BHH YES OR NO:22294}  PTSD Symptoms: Ever had a traumatic exposure:  {BHH YES OR NO:22294} Had a traumatic exposure in the last month:  {BHH YES OR NO:22294} Re-experiencing: {BHH YES OR NO:22294} {Re-experiencing:22673} Hypervigilance:  {BHH YES OR NO:22294} Hyperarousal: {BHH YES OR NO:22294} {Hyperarousal:22674} Avoidance: {BHH YES OR NO:22294} {Avoidance:22675}  Traumatic Brain Injury: {BHH YES OR NO:22294} {Traumatic Brain Injury:22676}  Past Psychiatric History: Diagnosis: ***  Hospitalizations: ***  Outpatient Care: ***  Substance Abuse Care: ***  Self-Mutilation: ***  Suicidal Attempts: ***  Violent Behaviors: ***   Past Medical History:  No past  medical history on file. History of Loss of Consciousness:  {BHH YES OR NO:22294} Seizure History:  {BHH YES OR NO:22294} Cardiac History:  {BHH YES OR NO:22294} Allergies:  No Known Allergies Current Medications:  Current Outpatient Prescriptions  Medication Sig Dispense Refill  . norethindrone-ethinyl estradiol 1/35 (NECON 1/35, 28,) tablet Take 1 tablet by mouth daily.  3 Package  4  . sertraline (ZOLOFT) 100 MG tablet Take 1 tablet (100 mg total) by mouth daily.  30 tablet  6  . traZODone (DESYREL) 50 MG tablet Take 1 tablet (50 mg total) by mouth at bedtime.  30 tablet  6    Previous Psychotropic Medications:  Medication Dose   ***  ***                     Substance Abuse History in the last 12 months: Substance Age of 1st Use Last Use Amount Specific Type  Nicotine  ***  ***  ***  ***  Alcohol  ***  ***  ***  ***  Cannabis  ***  ***  ***  ***  Opiates  ***  ***  ***  ***  Cocaine  ***  ***  ***  ***  Methamphetamines  ***  ***  ***  ***  LSD  ***  ***  ***  ***  Ecstasy  ***   ***  ***  ***  Benzodiazepines  ***  ***  ***  ***  Caffeine  ***  ***  ***  ***  Inhalants  ***  ***  ***  ***  Others:                          Medical Consequences of  Substance Abuse: ***  Legal Consequences of Substance Abuse: ***  Family Consequences of Substance Abuse: ***  Blackouts:  {BHH YES OR NO:22294} DT's:  {BHH YES OR NO:22294} Withdrawal Symptoms:  {BHH YES OR NO:22294} {Withdrawal Symptoms:22677}  Social History: Current Place of Residence: *** Place of Birth: *** Family Members: *** Marital Status:  {Marital Status:22678} Children: ***  Sons: ***  Daughters: *** Relationships: *** Education:  {Education:22679} Educational Problems/Performance: *** Religious Beliefs/Practices: *** History of Abuse: {Desc; abuse:16542} Occupational Experiences; Military History:  {Military History:22680} Legal History: *** Hobbies/Interests: ***  Family History:  No  family history on file.  Mental Status Examination/Evaluation: Objective:  Appearance: {Appearance:22683}  Eye Contact::  {BHH EYE CONTACT:22684}  Speech:  {Speech:22685}  Volume:  {Volume (PAA):22686}  Mood:  ***  Affect:  {Affect (PAA):22687}  Thought Process:  {Thought Process (PAA):22688}  Orientation:  {BHH ORIENTATION (PAA):22689}  Thought Content:  {Thought Content:22690}  Suicidal Thoughts:  {ST/HT (PAA):22692}  Homicidal Thoughts:  {ST/HT (PAA):22692}  Judgement:  {Judgement (PAA):22694}  Insight:  {Insight (PAA):22695}  Psychomotor Activity:  {Psychomotor (PAA):22696}  Akathisia:  {BHH YES OR NO:22294}  Handed:  {Handed:22697}  AIMS (if indicated):  ***  Assets:  {Assets (PAA):22698}    Laboratory/X-Ray Psychological Evaluation(s)   ***  ***   Assessment:  {axis diagnosis:3049000}  AXIS I {psych axis 1:31909}  AXIS II {psych axis 2:31910}  AXIS III No past medical history on file.   AXIS IV {psych axis iv:31915}  AXIS V {psych axis v score:31919}   Treatment Plan/Recommendations:  Plan of Care: ***  Laboratory:  {Laboratory:22682}  Psychotherapy: ***  Medications: ***  Routine PRN Medications:  {BHH YES OR ZO:10960}  Consultations: ***  Safety Concerns:  ***  Other:      Spero Geralds, PsyD 12/30/20132:40 PM

## 2012-03-30 HISTORY — PX: OTHER SURGICAL HISTORY: SHX169

## 2012-03-31 ENCOUNTER — Ambulatory Visit (INDEPENDENT_AMBULATORY_CARE_PROVIDER_SITE_OTHER): Payer: PRIVATE HEALTH INSURANCE | Admitting: Family Medicine

## 2012-03-31 ENCOUNTER — Encounter: Payer: Self-pay | Admitting: Family Medicine

## 2012-03-31 VITALS — BP 129/88 | HR 116 | Temp 97.4°F | Ht 68.0 in | Wt 124.0 lb

## 2012-03-31 DIAGNOSIS — R319 Hematuria, unspecified: Secondary | ICD-10-CM

## 2012-03-31 DIAGNOSIS — R300 Dysuria: Secondary | ICD-10-CM

## 2012-03-31 LAB — POCT URINALYSIS DIPSTICK
Bilirubin, UA: NEGATIVE
Glucose, UA: NEGATIVE
Leukocytes, UA: NEGATIVE
Nitrite, UA: NEGATIVE
Spec Grav, UA: 1.02
Urobilinogen, UA: 1
pH, UA: 6.5

## 2012-03-31 LAB — POCT UA - MICROSCOPIC ONLY

## 2012-03-31 MED ORDER — CEPHALEXIN 500 MG PO CAPS: 500.0000 mg | ORAL_CAPSULE | Freq: Three times a day (TID) | ORAL | Status: DC

## 2012-03-31 MED ORDER — CEPHALEXIN 500 MG PO CAPS: 500.0000 mg | ORAL_CAPSULE | Freq: Two times a day (BID) | ORAL | Status: DC

## 2012-03-31 NOTE — Patient Instructions (Addendum)
I sent in the Keflex for you.  We'll send the urine for a culture.    Have a recheck in about 1 week.

## 2012-04-01 ENCOUNTER — Encounter: Payer: Self-pay | Admitting: Family Medicine

## 2012-04-01 DIAGNOSIS — R319 Hematuria, unspecified: Secondary | ICD-10-CM | POA: Insufficient documentation

## 2012-04-01 NOTE — Assessment & Plan Note (Signed)
Negative nitrite/leuks. Positive Hgb with 10-20 RBCs noted.  Trace ketones Possibly dehydration versus UTI versus nephrolithiasis, although pain not major complaint.   Treat with Keflex for presumptive UTI.  Needs test of cure next week to ensure resolution of hematuria.

## 2012-04-01 NOTE — Progress Notes (Signed)
  Subjective:    Patient ID: Samantha Norman, female    DOB: 16-Sep-1985, 27 y.o.   MRN: [id number]  HPI  1.  Dark colored urine:  27 yo F in usual state of health until this AM when she noted dark urine.  Endorses decreased PO fluid intake for past several days including yesterday.  Has only had 1 further episode of urinary voiding today, which was also dark.  Little po fluid intake today.  Denies any myalgias, malaise, fevers, chills, dysuria, urinary hesitancy/urgency.  No abdominal pain.  Does endorse some chronic back pain not acutely worsened since she noted urinary darkness.    Review of Systems See HPI above for review of systems.       Objective:   Physical Exam Gen:  Alert, cooperative patient who appears stated age in no acute distress.  Vital signs reviewed.        Assessment & Plan:

## 2012-04-07 ENCOUNTER — Ambulatory Visit (INDEPENDENT_AMBULATORY_CARE_PROVIDER_SITE_OTHER): Payer: PRIVATE HEALTH INSURANCE | Admitting: Family Medicine

## 2012-04-07 ENCOUNTER — Encounter: Payer: Self-pay | Admitting: Family Medicine

## 2012-04-07 VITALS — BP 129/93 | HR 86 | Temp 97.6°F | Ht 68.6 in | Wt 124.2 lb

## 2012-04-07 DIAGNOSIS — R5383 Other fatigue: Secondary | ICD-10-CM

## 2012-04-07 DIAGNOSIS — R5381 Other malaise: Secondary | ICD-10-CM

## 2012-04-07 DIAGNOSIS — F329 Major depressive disorder, single episode, unspecified: Secondary | ICD-10-CM

## 2012-04-07 DIAGNOSIS — R319 Hematuria, unspecified: Secondary | ICD-10-CM

## 2012-04-07 DIAGNOSIS — R202 Paresthesia of skin: Secondary | ICD-10-CM

## 2012-04-07 DIAGNOSIS — R209 Unspecified disturbances of skin sensation: Secondary | ICD-10-CM

## 2012-04-07 DIAGNOSIS — R42 Dizziness and giddiness: Secondary | ICD-10-CM

## 2012-04-07 LAB — COMPREHENSIVE METABOLIC PANEL
ALT: 20 U/L (ref 0–35)
AST: 17 U/L (ref 0–37)
Albumin: 5.1 g/dL (ref 3.5–5.2)
Alkaline Phosphatase: 25 U/L — ABNORMAL LOW (ref 39–117)
BUN: 14 mg/dL (ref 6–23)
CO2: 28 mEq/L (ref 19–32)
Calcium: 10 mg/dL (ref 8.4–10.5)
Chloride: 102 mEq/L (ref 96–112)
Creat: 0.89 mg/dL (ref 0.50–1.10)
Glucose, Bld: 94 mg/dL (ref 70–99)
Potassium: 4 mEq/L (ref 3.5–5.3)
Sodium: 136 mEq/L (ref 135–145)
Total Bilirubin: 0.8 mg/dL (ref 0.3–1.2)
Total Protein: 7.5 g/dL (ref 6.0–8.3)

## 2012-04-07 LAB — CBC
HCT: 41.7 % (ref 36.0–46.0)
Hemoglobin: 14.8 g/dL (ref 12.0–15.0)
MCH: 32.7 pg (ref 26.0–34.0)
MCHC: 35.5 g/dL (ref 30.0–36.0)
MCV: 92.1 fL (ref 78.0–100.0)
Platelets: 236 10*3/uL (ref 150–400)
RBC: 4.53 MIL/uL (ref 3.87–5.11)
RDW: 12.9 % (ref 11.5–15.5)
WBC: 5.4 10*3/uL (ref 4.0–10.5)

## 2012-04-07 LAB — POCT URINALYSIS DIPSTICK
Bilirubin, UA: NEGATIVE
Glucose, UA: NEGATIVE
Ketones, UA: NEGATIVE
Leukocytes, UA: NEGATIVE
Nitrite, UA: NEGATIVE
Protein, UA: NEGATIVE
Spec Grav, UA: <=1.005
Urobilinogen, UA: 0.2
pH, UA: 7

## 2012-04-07 LAB — POCT UA - MICROSCOPIC ONLY

## 2012-04-07 LAB — VITAMIN B12: Vitamin B-12: 434 pg/mL (ref 211–911)

## 2012-04-07 NOTE — Patient Instructions (Signed)
I will send a urine culture today.   We will also check BMET, CBC, B12.  I'll follow up the results as well.

## 2012-04-08 ENCOUNTER — Ambulatory Visit (INDEPENDENT_AMBULATORY_CARE_PROVIDER_SITE_OTHER): Payer: PRIVATE HEALTH INSURANCE | Admitting: Family Medicine

## 2012-04-08 DIAGNOSIS — R209 Unspecified disturbances of skin sensation: Secondary | ICD-10-CM

## 2012-04-08 DIAGNOSIS — R202 Paresthesia of skin: Secondary | ICD-10-CM

## 2012-04-08 DIAGNOSIS — R1310 Dysphagia, unspecified: Secondary | ICD-10-CM | POA: Insufficient documentation

## 2012-04-08 DIAGNOSIS — R42 Dizziness and giddiness: Secondary | ICD-10-CM | POA: Insufficient documentation

## 2012-04-08 DIAGNOSIS — R5383 Other fatigue: Secondary | ICD-10-CM | POA: Insufficient documentation

## 2012-04-08 NOTE — Assessment & Plan Note (Signed)
Trace hematuria today. Urine culture sent. She is finishing up her Keflex. Not likely significant.

## 2012-04-08 NOTE — Assessment & Plan Note (Signed)
Concerned with what sounds like relapsing and remitting neurological symptoms is a course for multiple sclerosis. Am reassured by the fact that the right side is negative. She's not had any ocular neuritis. However she has had multiple complaints over the past several months showing possible lesions in the states and time. It is hard to ferret out what exactly might be coming from potentially organic disease versus lack of food intake versus depression. Have discussed also with Dr. Sheffield Slider who shares my concerns for multiple sclerosis. Rechecking some labs today. If all this comes back normal we need to make a decision that point for MRI versus referral to neurology for possibly both.

## 2012-04-08 NOTE — Progress Notes (Signed)
  Subjective:    Patient ID: Samantha Norman, female    DOB: Apr 08, 1985, 27 y.o.   MRN: [id number]  HPI  Samantha Norman returns today with multiple concerns. Her immediate concerns are that she has had tingling and numbness in the dorsal aspect of her right foot. She is also complaining of increasing fatigue. Her initial followup appointment was scheduled to be rechecked after she had hematuria noted on urinalysis last week.  #1. Paresthesias and fatigue: These have been in some form present since September/October of last year. The tingling and numbness of the upper foot has been present for about 2 weeks. She has had a period of vertigo when she flew to another part of the country in mid December.  Vertigo lasted about 7-10 days. Resolved on its own.  Cannot remember if she has had similar episodes of paresthesias elsewhere in the past. She does report intermittent periods of diplopia. Often this will start in one eye and then calls to in the other eye as well. This can sometimes last for 1-2 days at a time. She last had her eyes checked and had a prescription change the optometrist in July of last year.  #2. Hematuria: Patient has had no dysuria or polyuria. She is currently on a course of Keflex. No abdominal pain. No nausea vomiting. Has had some mild headaches.  Review of Systems See HPI above for review of systems.       Objective:   Physical Exam Gen:  Alert, cooperative patient who appears stated age in no acute distress.  Vital signs reviewed. HEENT:  [place]/AT.  EOMI, PERRL.  I cannot appreciate cup to disc ratio. MMM, tonsils non-erythematous, non-edematous.  External ears WNL, Bilateral TM's normal without retraction, redness or bulging.  Neck: No thyromegaly Heart: Regular rhythm without murmur Lungs clear throughout Extremities: Warm and dry. No edema noted bilateral lower extremities. Neuro: CN II through XII intact. Mild essential tremor noted bilaterally. I do note DTRs +4 triceps,  brachioradialis, Achilles, patellar and symmetric bilaterally. She has 4-5 beats of clonus bilaterally. Babinski's is downgoing. Lhermitte's sign is negative Psych: Pleasant affect. Not depressed appearing. Linear and coherent thought process.       Assessment & Plan:

## 2012-04-08 NOTE — Assessment & Plan Note (Signed)
Again, with all the stresses in her life, unclear how much of this is related to a possible underlying condition versus simply stress. Did discuss that if she's having trouble swallowing liquid, we should be concerned. For now, we will simply watch this.

## 2012-04-08 NOTE — Assessment & Plan Note (Signed)
Again, possibly unrelated though of course concern is that MS would possibly tie all this together.

## 2012-04-08 NOTE — Patient Instructions (Addendum)
You can call [phone] if you're concerned.    You can take the Trazodone.    Let me know about the referral on Monday.

## 2012-04-08 NOTE — Assessment & Plan Note (Signed)
Do not think her current symptoms are related to her other concerns. I did discuss with her that multiple sclerosis causes paresthesias or changes in sensation that last over time rather than several minutes or even hours. Did discuss that she'll likely be more sensitive to any pains or tingling sensations that she might otherwise have ignored. Unless these things persist for several days at a time she should not worry about these.

## 2012-04-08 NOTE — Assessment & Plan Note (Addendum)
Compounding factor in rest of her presentation. She wondered if some of her symptoms might be secondary to medications. 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 Patient states that eating and depression have been improving.  Has not needed Trazodone for sleep.  As noted above, it is difficult to piece together what might be caused by depression or lack of good food intake.   Continuing to take Zoloft.

## 2012-04-08 NOTE — Assessment & Plan Note (Signed)
Possibly secondary to walking around and flat-footed shoes on hard surfaces all day. Patient's been doing this for a while now. Unclear how much this is related to her vertigo and fatigue.

## 2012-04-08 NOTE — Progress Notes (Signed)
  Subjective:    Patient ID: Samantha Norman, female    DOB: 09/24/1985, 27 y.o.   MRN: [id number]  HPI  #1. Samantha Norman returns today with several more concerns.  She has had some intermittent shoulder pain with tingling in her right arm. She's also had pain bilateral shins. She now is having numbness and tingling on the bottom of her feet. Along with this she had an episode of dysphasia lunch time. She was eating a peanut butter sounds. She states that ever since about 2 days after starting Keflex she noted some dysphagia with solid foods. She is in no dysphasia or odynophagia with liquids. She is somewhat concerned about this and wanted to bring this up today as well. No fevers or chills.  Review of Systems See HPI above for review of systems.       Objective:   Physical Exam  Gen:  Alert, cooperative patient who appears stated age in no acute distress.  Vital signs reviewed. Neck:  Trachea midline. No masses noted. No thyromegaly noted. Heart regular rate and rhythm Extremities: Feet with downgoing Babinski bilaterally. Good sensation bilaterally She does have some tenderness along her pretibial area bilaterally.       Assessment & Plan:

## 2012-04-09 LAB — URINE CULTURE
Colony Count: NO GROWTH
Organism ID, Bacteria: NO GROWTH

## 2012-04-11 ENCOUNTER — Ambulatory Visit (INDEPENDENT_AMBULATORY_CARE_PROVIDER_SITE_OTHER): Payer: PRIVATE HEALTH INSURANCE | Admitting: Psychology

## 2012-04-11 DIAGNOSIS — F329 Major depressive disorder, single episode, unspecified: Secondary | ICD-10-CM

## 2012-04-11 NOTE — Assessment & Plan Note (Signed)
Report of mood is anxious.  Affect is consistent.  Thought process is goal directed.  Evidence of mild rumination.  Focused on respecting her thoughts and feelings - independent of her father's assessment or others - for that matter.  She is ambivalent:  Doesn't want to believe anything is wrong, isn't sure if she would want to know if there is something wrong and feels guilty that she would pursue given the likelihood that this is just "stress."    Used cognitive techniques to get clear on a reasonable approach moving forward.  She voiced several times that she would like to pursue neurology referral.  Supported HER decision to take of of herself.  Whether she schedules or not, wanted her to feel supported and validated in her concern for her health.  Will follow as needed and continue to communicate via email and phone.

## 2012-04-11 NOTE — Progress Notes (Signed)
Samantha Norman presents for follow-up.  She was going to cancel the appointment but after talking with her, she decided to keep it.  She reports that she had doubts about further assessment (via a neurologist) after talking to her father about it.  She has a reported history of them downplaying her medical issues (pneumonia, broken back).  Although her father gave her permission to pursue further assessment it was because she was the "anxious type" and needed the reassurance - not because anything was likely wrong.

## 2012-04-12 ENCOUNTER — Other Ambulatory Visit: Payer: Self-pay | Admitting: Family Medicine

## 2012-04-12 DIAGNOSIS — R42 Dizziness and giddiness: Secondary | ICD-10-CM

## 2012-04-19 DIAGNOSIS — G43009 Migraine without aura, not intractable, without status migrainosus: Secondary | ICD-10-CM | POA: Insufficient documentation

## 2012-05-03 ENCOUNTER — Ambulatory Visit (INDEPENDENT_AMBULATORY_CARE_PROVIDER_SITE_OTHER): Payer: PRIVATE HEALTH INSURANCE | Admitting: Psychology

## 2012-05-03 DIAGNOSIS — F329 Major depressive disorder, single episode, unspecified: Secondary | ICD-10-CM

## 2012-05-03 NOTE — Progress Notes (Signed)
Samantha Norman presents for follow-up.  She gets an MRI tomorrow.  She had another symptom that was possibly suggestive of MS so she thinks this is the right decision.  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  Has resumed weekly contact with her mother with pretty firm boundaries in tact.  Is disappointed with the lack of support she is getting from her dad with regards to her neurological issues but does not think it is terribly out of the ordinary for him.    Has 7 residency interviews and does not want many more.  Has completed two interviews.  Has another one Friday.  Match day is near the end of March.  Discussed a relationship with a peer that is challenging.

## 2012-05-03 NOTE — Assessment & Plan Note (Signed)
Mood is reported as anxious but is tied to two very clear situations.  Affect is within normal limits.  She seems to be handling a very high level of stress reasonably well.  Her closest supports remain Dr. Raymondo Band, me and her health care provider.  These are not likely good long-term supports but are effective in the short-term.  She seems to have a very good sense of appropriate boundaries with her parents - mom especially.  This continues to work well for her and she is not struggling on the other side with guilt.  This is a definite improvement.    Discussed various scenarios with MRI results and how to communicate.  She will keep in touch.  Follow-up February 11th at 9:00.

## 2012-05-10 ENCOUNTER — Ambulatory Visit (INDEPENDENT_AMBULATORY_CARE_PROVIDER_SITE_OTHER): Payer: PRIVATE HEALTH INSURANCE | Admitting: Psychology

## 2012-05-10 DIAGNOSIS — F329 Major depressive disorder, single episode, unspecified: Secondary | ICD-10-CM

## 2012-05-10 NOTE — Assessment & Plan Note (Signed)
Report of mood remains mild to moderately anxious depending on the situation.  Seems to be a pretty chronic state of anxiety that does not appear to be impacting her work function.  No evidence of depressive thoughts.  Almost exclusively anxious based thoughts.  Able to work through this when brought to her attention.  Asked her to consider shifting thinking about stress and her body.  Right now the thoughts are very judgmental.  Wondered whether a more curious mind set would be helpful.  If indeed her symptoms are stress related, it highlights the complex connection between mind and body.  Getting in the habit of noticing without judging and focusing on function (which appears to be good) is a reasonable strategy while remaining on the look out for anything serious.  Follow-up on February 25th at 10:00.  Match day is March 21.  Scheduled an appointment for then as well.

## 2012-05-10 NOTE — Progress Notes (Signed)
Samantha Norman presents for follow-up.  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  She has gotten some positive feedback and is learning a lot about herself and the program.  Relieved about MRI results being negative for MS.  Struggling with the idea that stress could be causing her symptoms - not because she doesn't believe it could be but because she feels like she should be able to manage the stress better.  Discussed a collegial relationship as well and how best to set limits.

## 2012-05-14 ENCOUNTER — Other Ambulatory Visit: Payer: Self-pay

## 2012-05-23 ENCOUNTER — Ambulatory Visit (INDEPENDENT_AMBULATORY_CARE_PROVIDER_SITE_OTHER): Payer: PRIVATE HEALTH INSURANCE | Admitting: Psychology

## 2012-05-23 DIAGNOSIS — F329 Major depressive disorder, single episode, unspecified: Secondary | ICD-10-CM

## 2012-05-23 NOTE — Assessment & Plan Note (Signed)
Mood is reported as anxious.  Affect is similar.  She talks quickly and has a lot to say.  Benefits from processing but tends to ruminate.  Thoughts remain more positive than initial presentation when depression was more evident.  This sounds much more anxious and reasonably appropriate to the situation.  She managed a very stressful interview / travel schedule.    Continues with catastrophic thinking that leads to a desire to flush (e.g. 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  Challenged that thinking.  May need to concretize this a bit more:  Be still could be a nice reminder to steady herself for a time before making any kind of major decision.  A refresher on fight or flight might be good as well.    Looked at using SBI technique with dad to challenge herself with family relationships (when she has time and energy).  Also talked about giving unhealthy people too much power in her life.  Will do prep for an upcoming interview tomorrow.

## 2012-05-23 NOTE — Progress Notes (Signed)
Casara presents for follow-up.  She has an appointment scheduled tomorrow but wanted one today as well to process some things she has experienced recently.  Reviewed her Pharm interviews and travel woes getting there this past week.  Discussed upcoming [facility] interview and some family relationship dynamics.

## 2012-05-24 ENCOUNTER — Ambulatory Visit (INDEPENDENT_AMBULATORY_CARE_PROVIDER_SITE_OTHER): Payer: PRIVATE HEALTH INSURANCE | Admitting: Psychology

## 2012-05-24 DIAGNOSIS — F329 Major depressive disorder, single episode, unspecified: Secondary | ICD-10-CM

## 2012-05-24 NOTE — Progress Notes (Signed)
Samantha Norman presents for follow-up. She reports she is in a low place.  She went to bed around 6:00 last night and with the exception of a brief period of wakefulness around 10:30, she slept until morning.  Thinks she is emotionally drained.    Questioning pharmacy residency process, pharmacy in general and feeling very alone.  Recognizes she has support in Merchandiser, retail, physician and me but is thinking down the road to a time where she leaves or this is no longer available.    Discussed pharm interview tomorrow and strategies to manage it.

## 2012-05-24 NOTE — Assessment & Plan Note (Signed)
Report of mood is depressed.  Affect was consistent initially and thoughts were more negative than yesterday.  Clarified and she is not feeling anxious as much as sad.  Discussed strategies to deal with feeling alone:  Name it.  [place] it.  Cope with it.  Reviewed coping mechanisms.    With regards to questions about career, recommended NO MAJOR DECISIONS until after the match.  Will revisit then.  Discussed ways to ward off rumination on this issue.  It does help her to voice things out loud so she may continue to do that with her supports.  Her outlook on tomorrow's interview was much more positive than I had anticipated.  There were basically two sticking points that we worked through.  Will follow on 3/10 at 4:00 or sooner if needed.

## 2012-06-02 ENCOUNTER — Telehealth: Payer: Self-pay | Admitting: Family Medicine

## 2012-06-02 MED ORDER — BUSPIRONE HCL 5 MG PO TABS: 5.0000 mg | ORAL_TABLET | Freq: Three times a day (TID) | ORAL | Status: DC | PRN

## 2012-06-02 NOTE — Telephone Encounter (Signed)
Received call on preseptal and will presenting this morning. Patient had spoken to me in passing earlier this week asking to come down off of her Zoloft because she was doing much better from an anxiety standpoint. She feels that counseling has been helping her the most. However today she calls with concerns for about 24 hour increased anxiety. She was unable toforce herself to eat yesterday and has been extremely anxious today. 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 She states will come by and see me tomorrow.  Denies any acute changes or anything would have have sparked her rising anxiety.  Plan is not to stop her Zoloft. She states she has not actually decrease the medication yet. Send in short-term course of BuSpar to see if this helps with her current anxiety. Followup with her tomorrow and see how she's doing.

## 2012-06-06 ENCOUNTER — Ambulatory Visit (INDEPENDENT_AMBULATORY_CARE_PROVIDER_SITE_OTHER): Payer: PRIVATE HEALTH INSURANCE | Admitting: Psychology

## 2012-06-06 DIAGNOSIS — F329 Major depressive disorder, single episode, unspecified: Secondary | ICD-10-CM

## 2012-06-06 NOTE — Assessment & Plan Note (Addendum)
Report of mood is actually more anxious than depressed.  Thought process is more anxious as well.  Would consider adding a diagnosis of GAD.  Need to consider other mood possibilities as well.  Recommended doing some bibliotherapy through "Treatments that Work" series.  There is one on anxiety and worry that she could check out.  Revisited journaling as a way to help empty her head.  She did walk yesterday and that was beneficial but she fell recently on the ice and is sore - this is a barrier.    She decided to not fill Buspar yet.  Reiterated her strengths and her ability to tolerate and cope while also acknowledging how challenging it is to sit in an anxious place for this long.    Will see on the 21st - post match but she will likely touch base with me before then.

## 2012-06-06 NOTE — Progress Notes (Signed)
Samantha Norman presents for follow-up.  She has been struggling with an increased level of anxiety for about three days but seems to be coming out the other side of it.  She had a meeting with her dad and his wife that was stressful and found out very recently that her dad was fired from his job.  She does not think this will impact her financially but the emotional toll could be challenging.  The firing affects her mother which has already, in turn, tested Deletha's ability to set reasonable limits.  We revisited at length today.  Also revisited a work situation that seems largely to have resolved.

## 2012-06-17 ENCOUNTER — Ambulatory Visit (INDEPENDENT_AMBULATORY_CARE_PROVIDER_SITE_OTHER): Payer: PRIVATE HEALTH INSURANCE | Admitting: Psychology

## 2012-06-17 DIAGNOSIS — F329 Major depressive disorder, single episode, unspecified: Secondary | ICD-10-CM

## 2012-06-17 NOTE — Progress Notes (Signed)
Samantha Norman presents for follow-up.  She found out about the match this morning.  She matched at [facility] which was her 3rd choice.  She recognizes that in the grand scheme of things, she did incredibly well and yet she struggles with feeling rejected (from [facility]) and with the prospect of moving to [place] where she knows no one.  She is most concerned about leaving the support she has developed here and secondarily (or primarily depending on where she is at the moment), she is worried about the curriculum and specifically the lack of a match with her long term goals.  On a positive note, she really liked the people there and ranked them this highly mostly based on this factor.    Processed her experience for 50+ minutes and developed a plan moving forward.

## 2012-06-17 NOTE — Assessment & Plan Note (Signed)
She expresses feelings of excitement, fear and sadness over the course of our meeting.  All of these feelings seem normal and expected.  Worked on not letting the feelings of sadness and rejection affect her core sense of worthiness which is where she could go and which often leads her to want run away  (i.e. quit school).    Developed a plan that included the rest of the work day and this evening.  The plan allows time for distraction and processing as well as some nurturing.  She will talk to her parents later this afternoon when she feels more settled.  She will call next week to look at scheduling.

## 2012-06-20 ENCOUNTER — Ambulatory Visit (INDEPENDENT_AMBULATORY_CARE_PROVIDER_SITE_OTHER): Payer: PRIVATE HEALTH INSURANCE | Admitting: Psychology

## 2012-06-20 DIAGNOSIS — F329 Major depressive disorder, single episode, unspecified: Secondary | ICD-10-CM

## 2012-06-20 NOTE — Progress Notes (Signed)
Samantha Norman presented this morning and asked for a meeting.  I had a cancellation and was able to squeeze her in.  She reported she spent the weekend crying and although not suicidal, had "lost the will to live."  When she reviewed the weekend, she noted periods of activity and socialization (including helping out a friend in [place] who was in a post pharm residency scramble) and periods of crying.  Focus of the meeting was processing her thoughts and feelings about this big change in her life.

## 2012-06-20 NOTE — Assessment & Plan Note (Signed)
Report of mood is sad and overwhelmed.  Affect is consistent but she is able to laugh and she can get a healthier perspective when talking things through.  Feelings override her cognition leaving her in a less than stable place.  Denies SI.  Function remains reasonable.    Big issues are feeling rejected by [facility] and the loss of support system she has developed here.  She does not have good support outside of [facility] and so moving represents a major loss.  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  We will discuss advantages and disadvantages at her next visit.

## 2012-06-28 ENCOUNTER — Ambulatory Visit (INDEPENDENT_AMBULATORY_CARE_PROVIDER_SITE_OTHER): Payer: 59 | Admitting: Psychology

## 2012-06-28 DIAGNOSIS — F329 Major depressive disorder, single episode, unspecified: Secondary | ICD-10-CM

## 2012-06-28 NOTE — Assessment & Plan Note (Signed)
Sounds like a deterioration in mood with associated symptoms including sleep and appetite.  Did not check in on attention and concentration.  No evidence of SI.  Her function remains good so I would not recommend any medication changes with the possible exception of increasing the Zoloft to see if that captures more of her symptoms.  With all the changes she is facing in the upcoming months, I would hesitate to come off the medicine right now.   Discussed cognitive methods of dealing with the catastrophic thinking.  This is hard for her to do but is possible and in my mind, preferable to trying to find a medicine that manages it.    Discussed sleep hygiene.  She tossed and turned the other day.  She agreed to implement the bed for sleep protocol.    Follow on April 10th at 9:00.

## 2012-06-28 NOTE — Progress Notes (Signed)
Charmine presents for follow up.  Agenda included continued feeling of "rejection" - more on a personal than professional level, the move to [place], medication and family stuff.  She is headed to [place] a weekend from this coming.  Her whole family may be going which she thinks will be okay.  Her mom apparently has been helpful in the past when it comes to apartment hunting and her dad and brother will be a buffer.  She has found an apartment that sounds good.  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  Discussed.  Although her anxiety / stress feels like it has calmed down post-match, she is back to having difficulty with eating and sleeping.  These symptoms feel more depressive in nature to her than anxiety based.

## 2012-06-30 ENCOUNTER — Ambulatory Visit (INDEPENDENT_AMBULATORY_CARE_PROVIDER_SITE_OTHER): Payer: 59 | Admitting: Family Medicine

## 2012-06-30 ENCOUNTER — Encounter: Payer: Self-pay | Admitting: Family Medicine

## 2012-06-30 VITALS — BP 115/84 | HR 78 | Temp 98.0°F | Ht 68.5 in | Wt 127.7 lb

## 2012-06-30 DIAGNOSIS — R51 Headache: Secondary | ICD-10-CM

## 2012-06-30 DIAGNOSIS — M549 Dorsalgia, unspecified: Secondary | ICD-10-CM

## 2012-06-30 DIAGNOSIS — F329 Major depressive disorder, single episode, unspecified: Secondary | ICD-10-CM

## 2012-06-30 NOTE — Progress Notes (Signed)
Subjective:    Samantha Norman is a 27 y.o. female who presents to [facility] today for follow-up:  1.  Depression:  Essentially stable.  Has had some ups and downs, especially relating to Match results as she is having to move to [place] and leave her support system.  Had increased anxiety resolving around Match, but now with predominantly depressive features.  Manifests as decreasing appetite, feelings of guilt, decreased desire for social interaction.  Still taking Zoloft 100 mg.    2.  Back pain:  Left sided.  Present since fall on ice early March (roughly 1 month).  Has history of Right lumbar back pain present since injury which occurred in 2005.  Has chronic pars articularis defect on right side.  Has not really had much pain until this past Wednesday when she spent most of the day on her feet.  Pain is mostly there in back, non-radiating.  No paresthesias.  No motor weakness.  No bladder/bowel incontinence.    3.  Headache:  Present x past 5 days.  Has been taking Tylenol with inconsistent relief.  Decreased sleep for same period of time.  Difficulty falling asleep and staying asleep.  Band-like pain both sides of head and around eyes.  Family history of migraines, no personal history.  Has occurred almost every day at mid-day and present rest of day.  No fevers/chills.  No changes in vision.  Occasionally with some mild nausea.     The following portions of the patient's history were reviewed and updated as appropriate: allergies, current medications, past medical history, family and social history, and problem list. Patient is a nonsmoker.    PMH reviewed.  History reviewed. No pertinent past medical history. History reviewed. No pertinent past surgical history.  Medications reviewed. Current Outpatient Prescriptions  Medication Sig Dispense Refill  . busPIRone (BUSPAR) 5 MG tablet Take 1 tablet (5 mg total) by mouth 3 (three) times daily as needed. For anxiety  30 tablet  0  . cephALEXin  (KEFLEX) 500 MG capsule Take 1 capsule (500 mg total) by mouth 2 (two) times daily.  14 capsule  0  . norethindrone-ethinyl estradiol 1/35 (NECON 1/35, 28,) tablet Take 1 tablet by mouth daily.  3 Package  4  . sertraline (ZOLOFT) 100 MG tablet Take 1 tablet (100 mg total) by mouth daily.  30 tablet  6  . traZODone (DESYREL) 50 MG tablet Take 1 tablet (50 mg total) by mouth at bedtime.  30 tablet  6   No current facility-administered medications for this visit.    ROS as above otherwise neg.  No chest pain, palpitations, SOB, Fever, Chills, Abd pain, N/V/D.   Objective:   Physical Exam BP 115/84  Pulse 78  Temp(Src) 98 F (36.7 C) (Oral)  Ht 5' 8.5" (1.74 m)  Wt 127 lb 11.2 oz (57.924 kg)  BMI 19.13 kg/m2 Gen:  Alert, cooperative patient who appears stated age in no acute distress.  Vital signs reviewed. HEENT: EOMI,  PERRL.  FUndoscopy WNL.  MMM Neck:  No LAD, no fundoscopy.   Cardiac:  Regular rate and rhythm without murmur auscultated.  Good S1/S2. Pulm:  Clear to auscultation bilaterally with good air movement.  No wheezes or rales noted.   Abd:  Soft/nondistended/nontender.  Good bowel sounds throughout all four quadrants.  No masses noted.  Exts: Non edematous BL  LE, warm and well perfused.  Back:  Normal skin, Spine with normal alignment and no deformity.  No tenderness to vertebral  process palpation.  Paraspinous muscles are tender and without spasm BL lumbar region.   Range of motion is full at neck and lumbar sacral regions.  Straight leg raise on Left but not right side is positive for Right back pain.  Not positive for Left back pain.   Neuro:  Sensation and motor function 5/5 bilateral lower extremities.  Patellar and Achilles  DTR's +2 patellar BL    No results found for this or any previous visit (from the past 72 hour(s)).

## 2012-06-30 NOTE — Patient Instructions (Addendum)
Increase Sertraline 150 daily starting tomorrow.   Remember what you and Dr. Pascal Lux talked about regarding sleep hygiene.  I think the back pain is coming from the previous injury and aggravated by the recent fall.  Your headaches SHOULD get better with sleep, if they don't let me know.    Keep a headache journal. Write down when starts, how long, what side, associated symptoms, any meds or improvement.    Check in in 2-3 weeks.

## 2012-07-01 ENCOUNTER — Encounter: Payer: Self-pay | Admitting: Family Medicine

## 2012-07-01 DIAGNOSIS — M549 Dorsalgia, unspecified: Secondary | ICD-10-CM | POA: Insufficient documentation

## 2012-07-01 NOTE — Assessment & Plan Note (Signed)
Likely related to chronic pars defect as well as this new trauma with fall.  Has remained seated for past month during this current rotation.   However now with more standing during rotations so this is likely cause of exacerbation of pain.   Continue OTC analgesics for pain as needed.  FU if worsening. No red flags.

## 2012-07-01 NOTE — Assessment & Plan Note (Addendum)
Likely related to lack of sleep.  No red flags by history or exam.  Question if related to zoloft use.   Will see how she does on increased dose of zoloft.

## 2012-07-01 NOTE — Assessment & Plan Note (Addendum)
Not taking the Trazodone.  Will remove from med list.  After long discussion of pros/cons, decided on increasing Zoloft to 150 mg daily.  Can monitor for any side effects, improvements, adverse effects before heading to [place]. I agree that increasing is likely to help until she can establish a support system down there.   She is to call if she experiences any side effects from increased dose.   Reiterated sleep hygeine. To FU with Dr. Pascal Lux as previously scheduled.

## 2012-07-05 ENCOUNTER — Ambulatory Visit (INDEPENDENT_AMBULATORY_CARE_PROVIDER_SITE_OTHER): Payer: 59 | Admitting: Psychology

## 2012-07-05 DIAGNOSIS — F329 Major depressive disorder, single episode, unspecified: Secondary | ICD-10-CM

## 2012-07-05 NOTE — Assessment & Plan Note (Signed)
Increased dose might be helping mood.  Experiencing some nausea which she is managing with small, frequent meals.  She had already come up with some ideas regarding the trip with her mom and is thinking she will manage fine.  We have a follow-up scheduled for Monday where we can debrief.    Quick consult and will not charge.

## 2012-07-05 NOTE — Progress Notes (Signed)
Brief meeting to discuss change in upcoming weekend plans.  Her dad is not going to [place] with her to look for housing.  This makes the trip with her mother more challenging.  Discussed and problem-solved.  Titrated up on her medication and is tolerating it.  Mood is improved.  Feeling tired though most days.  Function at work is good.  Feeling better about [place].

## 2012-07-07 ENCOUNTER — Ambulatory Visit: Payer: 59 | Admitting: Psychology

## 2012-07-11 ENCOUNTER — Ambulatory Visit (INDEPENDENT_AMBULATORY_CARE_PROVIDER_SITE_OTHER): Payer: 59 | Admitting: Psychology

## 2012-07-11 DIAGNOSIS — F329 Major depressive disorder, single episode, unspecified: Secondary | ICD-10-CM

## 2012-07-11 NOTE — Assessment & Plan Note (Signed)
Overall the weekend was successful but felt very bad.  In addition to the little things that were troublesome, it was confirmation (again) that there is no connection between her and her mom.  She continues to grieve this and will especially when highlighted at times like this one and graduation in a few weeks.  She volunteered that she will have to create her family in her friendships.  It is something we have covered before and I am glad she revisited it during this meeting on her own.  It doesn't erase the grief but it does acknowledge that she needs that kind of connection and there is hope to cultivate it.  I think the match in [place] is really the best thing for her.  We will meet on the 29th at 9:00 a.m.  She is officially done with rotations and will be moving to [place] at the end of May / beginning of June.

## 2012-07-11 NOTE — Progress Notes (Signed)
Samantha Norman presents for follow-up.  She was successful in securing an apartment in [place] this weekend and decided to room with [name].  Other than that, the weekend was challenging.  She had to manage relationship dynamics with her mom and more surprisingly, with her brother.

## 2012-07-18 ENCOUNTER — Encounter: Payer: Self-pay | Admitting: *Deleted

## 2012-07-19 ENCOUNTER — Encounter: Payer: Self-pay | Admitting: Family Medicine

## 2012-07-19 ENCOUNTER — Ambulatory Visit (INDEPENDENT_AMBULATORY_CARE_PROVIDER_SITE_OTHER): Payer: 59 | Admitting: Family Medicine

## 2012-07-19 VITALS — BP 115/80 | HR 74 | Temp 98.1°F | Ht 68.5 in | Wt 126.0 lb

## 2012-07-19 DIAGNOSIS — F329 Major depressive disorder, single episode, unspecified: Secondary | ICD-10-CM

## 2012-07-19 DIAGNOSIS — M549 Dorsalgia, unspecified: Secondary | ICD-10-CM

## 2012-07-19 MED ORDER — VENLAFAXINE HCL 75 MG PO TABS: 75.0000 mg | ORAL_TABLET | Freq: Two times a day (BID) | ORAL | Status: DC

## 2012-07-19 MED ORDER — VENLAFAXINE HCL 37.5 MG PO TABS: 75.0000 mg | ORAL_TABLET | Freq: Two times a day (BID) | ORAL | Status: DC

## 2012-07-19 NOTE — Progress Notes (Signed)
Subjective:    Samantha Norman is a 27 y.o. female who presents to [facility] today for follow-up for depression and back pain:  1.  Depression:  Increased to 150 mg Sertraline and last week began suffering from increased fatigue as well as worsening tremors, mental fogginess, GI upset (constipation) and some mild nausea.  Concurrently, she had visit to [facility] with her mother and brother to scout out new locations to live but this did not go well and affected her mood as week progressed.  Very low on Sep 01, 2022, passive death wish without overt suicidal thoughts or plan.  She decreased the Sertraline on Sunday and has been taking reduced dose of 50 mg since then, with great improvement in all of her symptoms but most noticeably her mood and alertness.  Would like to stop or switch to another medication.    2.  Back pain:  Persists.  She stopped by my office earlier this week and I gave her some stretching exercises which have helped.  Pain worse when walking or standing all day.  Does radiate to buttocks, hamstrings, occasionally below knee with tingling to bottom of her feet.  Mostly on Left side where she had pain from fall back in March.  We had talked about Orthotics at prior visit and she would like to try these as well.     The following portions of the patient's history were reviewed and updated as appropriate: allergies, current medications, past medical history, family and social history, and problem list. Patient is a nonsmoker.    PMH reviewed.  No past medical history on file. No past surgical history on file.  Medications reviewed. Current Outpatient Prescriptions  Medication Sig Dispense Refill  . busPIRone (BUSPAR) 5 MG tablet Take 1 tablet (5 mg total) by mouth 3 (three) times daily as needed. For anxiety  30 tablet  0  . norethindrone-ethinyl estradiol 1/35 (NECON 1/35, 28,) tablet Take 1 tablet by mouth daily.  3 Package  4  . sertraline (ZOLOFT) 100 MG tablet Take 1 tablet (100 mg total)  by mouth daily.  30 tablet  6  . venlafaxine (EFFEXOR) 37.5 MG tablet Take 2 tablets (75 mg total) by mouth 2 (two) times daily.  60 tablet  1   No current facility-administered medications for this visit.    ROS as above otherwise neg.  No chest pain, palpitations, SOB, Fever, Chills, Abd pain, N/V/D.   Objective:   Physical Exam BP 115/80  Pulse 74  Temp(Src) 98.1 F (36.7 C) (Oral)  Ht 5' 8.5" (1.74 m)  Wt 126 lb (57.153 kg)  BMI 18.88 kg/m2 Gen:  Alert, cooperative patient who appears stated age in no acute distress.  Vital signs reviewed. Neuro:  DTR's +2 achilles and patellar.  No resting or intention tremor.  Sensation intact BL LE's.   Psych:  Smiling, conversant, not depressed appearing during much of interview, but did become teary when discussing family. Not anxious appearing.   No results found for this or any previous visit (from the past 72 hour(s)).

## 2012-07-20 NOTE — Patient Instructions (Signed)
Piriformis Syndrome with Rehab Piriformis syndrome is a condition the affects the nervous system in the area of the hip, and is characterized by pain and possibly a loss of feeling in the backside (posterior) thigh that may extend down the entire length of the leg. The symptoms are caused by an increase in pressure on the sciatic nerve by the piriformis muscle, which is on the back of the hip and is responsible for externally rotating the hip. The sciatic nerve and its branches connect to much of the leg. Normally the sciatic nerve runs between the piriformis muscle and other muscles. However, in certain individuals the nerve runs through the muscle, which causes an increase in pressure on the nerve and results in the symptoms of piriformis syndrome. SYMPTOMS   Pain, tingling, numbness, or burning in the back of the thigh that may also extend down the entire leg.  Occasionally, tenderness in the buttock.  Loss of function of the leg.  Pain that worsens when using the piriformis muscle (running, jumping, or stairs).  Pain that increases with prolonged sitting.  Pain that is lessened by laying flat on the back. CAUSES   Piriformis syndrome is the result of an increase in pressure placed on the sciatic nerve. Often times piriformis syndrome is an overuse injury.  Stress placed on the nerve from a sudden increase in the intensity, frequency, or duration of training.  Compensation of other extremity injuries. RISK INCREASES WITH:  Sports that involve the piriformis muscle (running, walking or jumping).  You are born with (congenital) a defect in which the sciatic nerve passes through the muscle. PREVENTION  Warm up and stretch properly before activity.  Allow for adequate recovery between workouts.  Maintain physical fitness:  Strength, flexibility, and endurance.  Cardiovascular fitness. PROGNOSIS  If treated properly, then the symptoms of piriformis syndrome usually resolve in 2  to 6 weeks. RELATED COMPLICATIONS   Persistent and possibly permanent pain and numbness in the lower extremity.  Weakness of the extremity that may progress to disability and inability to compete. TREATMENT  The most effective treatment for piriformis syndrome is rest from any activities that aggravate the symptoms. Ice and pain medication may help reduce pain and inflammation. The use of strengthening and stretching exercises may help reduce pain with activity. These exercises may be performed at home or with a therapist. A referral to a therapist may be given for further evaluation and treatment, such as ultrasound. Corticosteroid injections may be given to reduce inflammation that is causing pressure to be placed on the sciatic nerve. If non-surgical (conservative) treatment is unsuccessful, then surgery may be recommended.  MEDICATION   If pain medication is necessary, then nonsteroidal anti-inflammatory medications, such as aspirin and ibuprofen, or other minor pain relievers, such as acetaminophen, are often recommended.  Do not take pain medication for 7 days before surgery.  Prescription pain relievers may be given if deemed necessary by your caregiver. Use only as directed and only as much as you need.  Corticosteroid injections may be given by your caregiver. These injections should be reserved for the most serious cases, because they may only be given a certain number of times. HEAT AND COLD:   Cold treatment (icing) relieves pain and reduces inflammation. Cold treatment should be applied for 10 to 15 minutes every 2 to 3 hours for inflammation and pain and immediately after any activity that aggravates your symptoms. Use ice packs or massage the area with a piece of ice (ice massage).    Heat treatment may be used prior to performing the stretching and strengthening activities prescribed by your caregiver, physical therapist, or athletic trainer. Use a heat pack or soak the injury in  warm water. SEEK IMMEDIATE MEDICAL CARE IF:  Treatment seems to offer no benefit, or the condition worsens.  Any medications produce adverse side effects. EXERCISES RANGE OF MOTION (ROM) AND STRETCHING EXERCISES - Piriformis Syndrome These exercises may help you when beginning to rehabilitate your injury. Your symptoms may resolve with or without further involvement from your physician, physical therapist or athletic trainer. While completing these exercises, remember:   Restoring tissue flexibility helps normal motion to return to the joints. This allows healthier, less painful movement and activity.  An effective stretch should be held for at least 30 seconds.  A stretch should never be painful. You should only feel a gentle lengthening or release in the stretched tissue. STRETCH - Hip Rotators  Lie on your back on a firm surface. Grasp your right / left knee with your right / left hand and your ankle with your opposite hand.  Keeping your hips and shoulders firmly planted, gently pull your right / left knee and rotate your lower leg toward your opposite shoulder until you feel a stretch in your buttocks.  Hold this stretch for __________ seconds. Repeat this stretch __________ times. Complete this stretch __________ times per day. STRETCH  Iliotibial Band  On the floor or bed, lie on your side so your right / left leg is on top. Bend your knee and grab your ankle.  Slowly bring your knee back so that your thigh is in line with your trunk. Keep your heel at your buttocks and gently arch your back so your head, shoulders and hips line up.  Slowly lower your leg so that your knee approaches the floor/bed until you feel a gentle stretch on the outside of your right / left thigh. If you do not feel a stretch and your knee will not fall farther, place the heel of your opposite foot on top of your knee and pull your thigh down farther.  Hold this stretch for __________ seconds. Repeat  __________ times. Complete __________ times per day. STRENGTHENING EXERCISES - Piriformis Syndrome  These are some of the caregiver again or until your symptoms are resolved. Remember:   Strong muscles with good endurance tolerate stress better.  Do the exercises as initially prescribed by your caregiver. Progress slowly with each exercise, gradually increasing the number of repetitions and weight used under their guidance. STRENGTH - Hip Abductors, Straight Leg Raises Be aware of your form throughout the entire exercise so that you exercise the correct muscles. Sloppy form means that you are not strengthening the correct muscles.  Lie on your side so that your head, shoulders, knee and hip line up. You may bend your lower knee to help maintain your balance. Your right / left leg should be on top.  Roll your hips slightly forward, so that your hips are stacked directly over each other and your right / left knee is facing forward.  Lift your top leg up 4-6 inches, leading with your heel. Be sure that your foot does not drift forward or that your knee does not roll toward the ceiling.  Hold this position for __________ seconds. You should feel the muscles in your outer hip lifting (you may not notice this until your leg begins to tire).  Slowly lower your leg to the starting position. Allow the muscles to   fully relax before beginning the next repetition. Repeat __________ times. Complete this exercise __________ times per day.  STRENGTH - Hip Abductors, Quadriped  On a firm, lightly padded surface, position yourself on your hands and knees. Your hands should be directly below your shoulders and your knees should be directly below your hips.  Keeping your right / left knee bent, lift your leg out to the side. Keep your legs level and in line with your shoulders.  Position yourself on your hands and knees.  Hold for __________ seconds.  Keeping your trunk steady and your hips level, slowly  lower your leg to the starting position. Repeat __________ times. Complete this exercise __________ times per day.  STRENGTH - Hip Abductors, Standing  Tie one end of a rubber exercise band/tubing to a secure surface (table, pole) and tie a loop at the other end.  Place the loop around your right / left ankle. Keeping your ankle with the band directly opposite of the secured end, step away until there is tension in the tube/band.  Hold onto a chair as needed for balance.  Keeping your back upright, your shoulders over your hips, and your toes pointing forward, lift your right / left leg out to your side. Be sure to lift your leg with your hip muscles. Do not "throw" your leg or tip your body to lift your leg.  Slowly and with control, return to the starting position. Repeat exercise __________ times. Complete this exercise __________ times per day.  Document Released: 03/16/2005 Document Revised: 09/15/2011 Document Reviewed: 06/28/2008 [employer] Patient Information 2013 [employer].  

## 2012-07-20 NOTE — Assessment & Plan Note (Signed)
Will provide exercises for back. Sounds like piriformis/sciatica which should improve with physical therapy as well as pain after prolonged standing.  Will refer to sports medicine for evaluation for orthotics.   No red flags.

## 2012-07-20 NOTE — Assessment & Plan Note (Signed)
Long discussion on pros and cons of meds. With 2 major new stresses (new job and moving to new area as well as decreased access to her support group), stopping meds outright doesn't seem like best idea. Switching to Effexor as did not tolerate increased doses of Sertraline.   Will FU with me on Friday in passing to ensure she's doing well with switch in medicine.  She has already started a cross taper on her own for meds.   More formal FU in 2 weeks.  TO continue to see Dr. Pascal Lux.

## 2012-07-26 ENCOUNTER — Ambulatory Visit (INDEPENDENT_AMBULATORY_CARE_PROVIDER_SITE_OTHER): Payer: 59 | Admitting: Psychology

## 2012-07-26 DIAGNOSIS — F329 Major depressive disorder, single episode, unspecified: Secondary | ICD-10-CM

## 2012-07-26 NOTE — Assessment & Plan Note (Signed)
Report of mood is euthymic today.  Affect is consistent.  Thoughts are clear and goal directed.  Work function has remained good.  Home function decreased with a recent return to normal.  This could be related to the change in medication or just another shift in mood and energy especially given the changing landscape that is her work / school / personal life right now.  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  She knows they pass and usually pretty quickly Connecting with people that are good for her  In addition, she asked that I write down some thoughts for her to refer to when she is feeling down or anxious.  She will ask [name] to do the same.  She plans to use these as reminders / points of connection so she doesn't feel the need to call every time.    Will follow next week prior to graduation to see how she is doing.

## 2012-07-26 NOTE — Progress Notes (Signed)
Samantha Norman presents for follow-up.  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  She is excited and sad.  She is mostly sad about leaving this place where she has felt so well supported.  She has very positive thoughts about going to [place] and thinks this was the best possible outcome of the match (at this point).  We spent some time talking about the range of emotions that she will likely be feeling over the next several weeks and how she can manage them.    Samantha Norman learned that in addition to her mother's side, their are mental health issues on her father's side.  Her father told her that her great grandmother likely had bipolar disorder and that an uncle (dad's brother) had been thought to have it as well.  Samantha Norman remembers her father would get bursts of energy throughout her childhood where he was take up a hobby, spend lots of money supporting it and then abruptly stop it.  She remembers him withdrawing - wanting to be by himself on a regular basis when she was growing up.  She thought that was due to him being an introvert but now wonders if this is indicative of mental health issues with him as well.

## 2012-08-01 ENCOUNTER — Ambulatory Visit (INDEPENDENT_AMBULATORY_CARE_PROVIDER_SITE_OTHER): Payer: 59 | Admitting: Psychology

## 2012-08-01 DIAGNOSIS — F329 Major depressive disorder, single episode, unspecified: Secondary | ICD-10-CM

## 2012-08-01 NOTE — Progress Notes (Signed)
Phillip presents for follow-up.  She had a good time back at school and is doing a mix of things this week prior to graduation.  She has tentative plans to move to [place] around the 23rd.  Her biggest complaint is continued physical symptoms including a noticeable (to her and she says to others) tremor and some rigidity when she moves her neck, side and foot (maybe other as well).  She has asked opinions from various people and is on the fence as to whether to follow-up with neurology.    Mood has been tracked for the last 18 days.  Roughly 6 days were outside of "normal" with the majority being a -1 or mildly low mood.  This mood does not get in the way of function and doesn't really feel sad.  She had difficulty describing it - maybe apathetic.  She had one day of -3 when coming off the Zoloft before starting the Effexor.  Anxiety has varied from 0 (no anxiety) to 3 (high level).

## 2012-08-01 NOTE — Assessment & Plan Note (Signed)
Report of mood is euthymic today.  Affect is consistent.  Thoughts are clear and goal directed.  She has less to talk about than session past.  The symptoms that are most disturbing (tremor) may be anxiety based but she says they don't occur when she is feeling anxious.  She was told it could be "sub-conscious anxiety" and it is possibly she is unaware of the anxiety but she has a language for anxiety and seems open to talking about these kinds of issues.  Discussed whether she would pursue neurology.  She thinks it could be a side effect of the Effexor and the pro / con ratio right now is in favor of continuing to take it.  Provided her some thoughts per her request.  Gave her a bibliotherapy resource that I hope will be helpful.  She asks for one appointment next week.  Will follow then.

## 2012-08-03 ENCOUNTER — Ambulatory Visit (INDEPENDENT_AMBULATORY_CARE_PROVIDER_SITE_OTHER): Payer: 59 | Admitting: Sports Medicine

## 2012-08-03 VITALS — BP 100/70 | Ht 68.5 in | Wt 125.0 lb

## 2012-08-03 DIAGNOSIS — R209 Unspecified disturbances of skin sensation: Secondary | ICD-10-CM

## 2012-08-03 DIAGNOSIS — M549 Dorsalgia, unspecified: Secondary | ICD-10-CM

## 2012-08-03 DIAGNOSIS — R202 Paresthesia of skin: Secondary | ICD-10-CM

## 2012-08-03 NOTE — Progress Notes (Signed)
  Subjective:    Patient ID: Samantha Norman, female    DOB: 01-06-1986, 27 y.o.   MRN: [id number]  HPI chief complaint: Low back pain  Patient is a very pleasant 27 year old female who comes in today to discuss possibly getting some orthotics. She is referred by her primary care physician Dr. Gwendolyn Grant. She has a history of chronic intermittent low back pain which began after she suffered an injury back in 2007. She describes an aching discomfort across her low-back with occasional radiating pain into her legs. She is also experiencing weakness diffusely in each of her legs as well as in her arms. She is currently being worked up by Dr. Anne Hahn for possible multiple sclerosis. She's been given some exercises for her lumbar spine by Dr. Gwendolyn Grant which have been helpful. 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 It was felt that orthotics may benefit her. She does get occasional foot pain along the plantar aspect of her feet.  Past medical history and current medications are reviewed. She has a history of major depression No known drug allergies    Review of Systems     Objective:   Physical Exam Well-developed, well-nourished. No acute distress. Awake alert and oriented x3  Lumbar spine: She has limited lumbar flexion do to tight hamstrings. Full extension. No pain with active movement. No tenderness to palpation. No spasm. Neurological exam: Negative straight leg raise bilaterally. She is hyperreflexive at 4/4 at the Achilles and patellar tendons. Strength is 5/5 both lower extremities. Station is intact to light touch grossly. She has a fairly neutral gait and normal arches.       Assessment & Plan:  1. Chronic low back pain  We will try her in a pair of green sports insoles and she will return to the office in 3 weeks for custom orthotics. She has an appointment with Dr. Anne Hahn later this week. At this point  in time I will defer neurological workup to his discretion.

## 2012-08-05 ENCOUNTER — Ambulatory Visit: Payer: 59 | Admitting: Psychology

## 2012-08-09 ENCOUNTER — Ambulatory Visit: Payer: 59 | Admitting: Psychology

## 2012-08-12 ENCOUNTER — Ambulatory Visit (INDEPENDENT_AMBULATORY_CARE_PROVIDER_SITE_OTHER): Payer: 59 | Admitting: Neurology

## 2012-08-12 ENCOUNTER — Encounter: Payer: Self-pay | Admitting: Neurology

## 2012-08-12 VITALS — BP 117/88 | HR 76 | Ht 68.75 in | Wt 127.0 lb

## 2012-08-12 DIAGNOSIS — R42 Dizziness and giddiness: Secondary | ICD-10-CM

## 2012-08-12 DIAGNOSIS — R209 Unspecified disturbances of skin sensation: Secondary | ICD-10-CM

## 2012-08-12 DIAGNOSIS — R202 Paresthesia of skin: Secondary | ICD-10-CM

## 2012-08-12 DIAGNOSIS — G43009 Migraine without aura, not intractable, without status migrainosus: Secondary | ICD-10-CM

## 2012-08-12 NOTE — Progress Notes (Signed)
Reason for visit: Sensory alteration  Samantha Norman is an 27 y.o. female  History of present illness:  Samantha Norman is a 27 year old left-handed white female with a history of anxiety and depression. The patient has had some problems with dizziness and rocking sensations, and she was seen in January 2014 for this. MRI evaluation of the brain was unremarkable at that time, and a brainstem auditory evoked response test was also normal. The patient has developed new symptoms that began in March of 2014. The patient has begun having some numbness in the feet and legs, with some right lower extremity discomfort, no pain on the left. The numbness became prominent 10 days ago. The patient has developed some occasional tingling sensations in the arms and hands. The patient has had some bouts of constipation, and some difficulty fully emptying her bladder. The patient denies any balance issues, but she also has had some tingly sensations going down from the neck into the low back. The patient returns for an evaluation. The patient has not had any true weakness of the extremities.  Past Medical History  Diagnosis Date  . Anxiety and depression   . Dizziness   . Hx of migraine headaches   . Fracture of lumbar spine     Hx. of fracture    History reviewed. No pertinent past surgical history.  Family History  Problem Relation Age of Onset  . Hypertension Mother   . Alzheimer's disease Maternal Uncle   . Cancer - Prostate Paternal Uncle   . Cancer - Prostate Paternal Grandfather     Social history:  reports that she has never smoked. She does not have any smokeless tobacco history on file. She reports that  drinks alcohol. Her drug history is not on file.  Allergies: No Known Allergies  Medications:  Current Outpatient Prescriptions on File Prior to Visit  Medication Sig Dispense Refill  . norethindrone-ethinyl estradiol 1/35 (NECON 1/35, 28,) tablet Take 1 tablet by mouth daily.  3 Package  4  .  venlafaxine (EFFEXOR) 75 MG tablet Take 1 tablet (75 mg total) by mouth 2 (two) times daily.  60 tablet  1   No current facility-administered medications on file prior to visit.    ROS:  Out of a complete 14 system review of symptoms, the patient complains only of the following symptoms, and all other reviewed systems are negative.  Fatigue Constipation Numbness, weakness, tremor  Blood pressure 117/88, pulse 76, height 5' 8.75" (1.746 m), weight 127 lb (57.607 kg).  Physical Exam  General: The patient is alert and cooperative at the time of the examination.  Skin: No significant peripheral edema is noted.   Neurologic Exam  Cranial nerves: Facial symmetry is present. Speech is normal, no aphasia or dysarthria is noted. Extraocular movements are full. Visual fields are full. Pupils are equal, round, and reactive to light. Discs are flat bilaterally.  Motor: The patient has good strength in all 4 extremities.  Coordination: The patient has good finger-nose-finger and heel-to-shin bilaterally.  Gait and station: The patient has a normal gait. Tandem gait is normal. Romberg is negative. No drift is seen.  Reflexes: Deep tendon reflexes are symmetric, but are somewhat brisk at the knees bilaterally. Toes are neutral to downgoing bilaterally.   Assessment/Plan:  One. Sensory alteration, bilateral lower extremities  The patient has a relatively normal clinical examination, but she indicates that she is having subjective sensory changes. The patient will be set up for MRI evaluation of the  cervical and thoracic spine with and without gadolinium. Blood work will be done today. The patient will followup if needed. The patient will be moving to [place], [place] and in June of 2014.   Marlan Palau MD 08/14/2012 6:40 PM  [facility] [address] [place], [place] [address]  Phone [phone] Fax [phone]

## 2012-08-15 ENCOUNTER — Telehealth: Payer: Self-pay | Admitting: Neurology

## 2012-08-15 LAB — ENA+DNA/DS+SJORGEN'S
ENA SSA (RO) Ab: <0.2 AI (ref 0.0–0.9)
ENA SSB (LA) Ab: 2.4 AI — ABNORMAL HIGH (ref 0.0–0.9)

## 2012-08-15 LAB — TSH: TSH: 2.51 u[IU]/mL (ref 0.450–4.500)

## 2012-08-15 LAB — HIV ANTIBODY (ROUTINE TESTING W REFLEX): HIV 1/O/2 Abs-Index Value: <1 (ref <1.00)

## 2012-08-15 LAB — RPR: RPR: NONREACTIVE

## 2012-08-15 NOTE — Telephone Encounter (Signed)
I called patient. The blood work is relatively unremarkable. Minimally positive ANA. The patient will be getting MRI studies of the spine.

## 2012-08-18 ENCOUNTER — Ambulatory Visit (INDEPENDENT_AMBULATORY_CARE_PROVIDER_SITE_OTHER): Payer: 59

## 2012-08-18 ENCOUNTER — Ambulatory Visit (INDEPENDENT_AMBULATORY_CARE_PROVIDER_SITE_OTHER): Payer: 59 | Admitting: Psychology

## 2012-08-18 DIAGNOSIS — F329 Major depressive disorder, single episode, unspecified: Secondary | ICD-10-CM

## 2012-08-18 DIAGNOSIS — R209 Unspecified disturbances of skin sensation: Secondary | ICD-10-CM

## 2012-08-18 MED ORDER — GADOPENTETATE DIMEGLUMINE 469.01 MG/ML IV SOLN: 11.0000 mL | Freq: Once | INTRAVENOUS | Status: AC | PRN

## 2012-08-19 NOTE — Progress Notes (Signed)
Samantha Norman presented for follow-up.  She had the MRI this morning and had some anxiety but managed it.  She is expecting to hear something later today.  She is mostly packed and ready to move to [facility].  She would like to fast forward to Monday when her mom and brother should be gone and she can settle into her new place.  She is anticipating a lot of family drama but thinks she will manage it and knows that it is time limited.  She reports that she does not think she will have too much trouble with the "final" goodbye because of all the goodbye-ing she has done so far and the trust that she has that continued contact is possible.

## 2012-08-19 NOTE — Assessment & Plan Note (Addendum)
Report of mood is euthymic today.  Affect is consistent.  She is not reporting too much anxiety despite the move planned for tomorrow and a stressful week ahead.  Her though content is hopeful and positive.  She continues to be perplexed by her medical issues but has a plan for how to handle that.    We have explored the possibility that her symptoms are manifestations of stress.  I have talked about exercise (yoga included) and meditation as possible ways to attenuate them if that is the case.  This is something she may need to explore further if the symptoms continue with out a good physiological explanation.  She plans to keep in touch via email and phone as necessary.

## 2012-08-22 ENCOUNTER — Telehealth: Payer: Self-pay | Admitting: Neurology

## 2012-08-22 NOTE — Telephone Encounter (Signed)
I called patient. MRI of the cervical and thoracic spine did not show spinal cord lesions. Mild degenerative changes in the neck. The blood work also was relatively unremarkable with exception of a low positive ANA.

## 2012-08-24 ENCOUNTER — Ambulatory Visit (INDEPENDENT_AMBULATORY_CARE_PROVIDER_SITE_OTHER): Payer: 59 | Admitting: Family Medicine

## 2012-08-24 ENCOUNTER — Encounter: Payer: Self-pay | Admitting: Family Medicine

## 2012-08-24 VITALS — BP 134/98 | HR 71 | Ht 68.75 in | Wt 124.0 lb

## 2012-08-24 DIAGNOSIS — M549 Dorsalgia, unspecified: Secondary | ICD-10-CM

## 2012-08-24 NOTE — Progress Notes (Signed)
Samantha Norman is a 27 y.o. female who presents to [facility] today for orthotics. Patient was seen at the sports medicine Center approximately 2 weeks ago. At that time she was placed into sports insoles is a trial run for custom orthotics. She is mild to moderate chronic back pain and the thought was that orthotics would help. She notes that the temporary sports insoles have done well. She notes moderate improvement in her pain. She feels well otherwise. She does however note bilateral lower leg tingling and occasional numbness. This is associated with hyperreflexia. She has several other symptoms such as vertigo, headaches and fatigue. She is currently being evaluated by Dr. for neurology for the possibility of multiple sclerosis. Recent cervical and thoracic spinal cord MRIs were negative.  She denies any weakness bowel bladder dysfunction or difficulty walking.    PMH reviewed. As noted above also significant for major depression History  Substance Use Topics  . Smoking status: Never Smoker   . Smokeless tobacco: Not on file  . 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 Normal gait. No significant pronation or supination.  Decreased hamstring flexibility.  Patient was fitted for a : standard, cushioned, semi-rigid orthotic. The orthotic was heated and afterward the patient stood on the orthotic blank positioned on the orthotic stand. The patient was positioned in subtalar neutral position and 10 degrees of ankle dorsiflexion in a weight bearing stance. After completion of molding, a stable base was applied to the orthotic blank. The blank was ground to a stable position for weight  bearing. Size: 8 Base: Blue EVA Posting and Padding: none   Mr Cervical Spine W Wo Contrast  08/19/2012   [facility] [address], [address] [place], [place] [address] [phone]  NEUROIMAGING REPORT   STUDY DATE: 08/18/2012 PATIENT NAME: Shanterica Biehler DOB: 12-31-1985 MRN: [id number]  ORDERING CLINICIAN: Dr Anne Hahn CLINICAL HISTORY: 26 year patient being evaluated for numbness and hyperreflexia COMPARISON FILMS: none EXAM:MRI C Spine w/wo TECHNIQUE: MRI of the cervical spine was obtained utilizing 3 mm sagittal slices from the posterior fossa down to the T3-4 level with T1, T2 and inversion recovery views. In addition 4 mm axial slices from C2-3 down to T1-2 level were included with T2 and gradient echo views.Post contrast sagittal and axial T 1 images were obtained as well.  CONTRAST: 11 ml magnevist IMAGING SITE: [facility] FINDINGS: The cervical vertebrae demonstrate normal alignment, body height and bone marrow signal characteristics. The intervertebral discs show a broad base disc bulges at C5-6 and C6-7 with mild canal narrowing but without significant compression. Contrast images do not result in abnormal areas of enhancement. The spinal cord parenchyma shows no abnormal signal intensities. The visualized portion of the upper thoracic spine and lower brain stem and cerebellum appear unremarkable.     IMPRESSION:  Mildly abnormal MRI scan of cervical spine showing mild disc degenrative changes at C5-6 and C6-7 but without significant compression.    INTERPRETING PHYSICIAN:  Delia Heady, MD Certified in  Neuroimaging by American Society of Neuroimaging and [employer]ities    Mr Thoracic  Spine W Wo Contrast  08/19/2012   [facility] [address] [place], [place] [address] [phone]  NEUROIMAGING REPORT   STUDY DATE: 08/18/2012 PATIENT NAME: Darnise Montag DOB: 02-12-1986 MRN: [id number]  ORDERING CLINICIAN: Dr Anne Hahn  CLINICAL HISTORY: 32 year patient with numbness and hyperreflexia COMPARISON FILMS: none EXAM: MRI Thoraxic spine w/wo TECHNIQUE: MRI of the thoracic spine was obtained utilizing 3 mm sagittal slices from C4-5 down to the L1-2 level with T1, T2 and inversion recovery views. In addition 4 mm axial slices from C7-T1 down to T12-L1 level were included with T1, T2 and gradient echo views.  Post contrast axial and sagittal T 1 images were obtained as well.  CONTRAST: 11 ml magnevist IMAGING SITE: [facility]  FINDINGS:  On sagittal views the vertebral bodies have normal height and alignment.  The spinal cord is normal in size and appearance. The paraspinal soft tissues are unremarkable.  On axial views there is no spinal stenosis or foraminal narrowing.  Limited views of the aorta, kidneys, liver, lungs and paraspinal muscles are unremarkable. Postcontrast images do not show any abnormalities      IMPRESSION:  Normal MRI scan of the thoracic spine.   INTERPRETING PHYSICIAN:  Delia Heady, MD Certified in  Neuroimaging by American Society of Neuroimaging and [employer] Neu[employer]specialities     Greater than 40 minutes spent with more than 50% face-to-face time.

## 2012-08-24 NOTE — Assessment & Plan Note (Signed)
Multiple causes possible.  Likely related to muscular spasm. Possibly related to neurologic workup however this appears to be normal at this time. She didn't improve with temporary sports insoles therefore I feel it is reasonable to proceed with custom orthotics today.   I have reviewed the patient's MRI findings.  Additionally we discussed following up with sports medicine in [place] if her symptoms do not improve or worsen. She may benefit from lumbar MRI to evaluate for spondylolisthesis. In 2007 she did have a unilateral stable pars defect at L5. This may have progressed and may explain her neurologic symptoms in her bilateral lower extremities.  In the meantime she will continue to work on core strengthening and will keep careful watch of her symptoms.   She expresses understanding and agreement

## 2012-08-24 NOTE — Patient Instructions (Addendum)
Thank you for coming in today. Please follow up with Dr. Corinna Gab in [place] or your assigned primary care doctor if he is out of network.  [place] if getting his contact information.

## 2012-09-21 ENCOUNTER — Telehealth: Payer: Self-pay | Admitting: Family Medicine

## 2012-09-21 NOTE — Telephone Encounter (Signed)
Patient called to discuss recent symptoms.  Had been having increased tremor, shakiness.  Had episode of falling while out shopping -- bent over to pick up item on lower shelf and fell into the shelves.  Had episodes of "shaking on the floor and feeling like I would pass out."  No LOC. Decrease of Effexor recently because she was running low on medications, but no change in symptoms (started before Effexor decrease).  Picking up XR meds tonight.    Told her if these episodes recur she should be seen.  Otherwise she is doing well, happy at new position.  Frustrated over fact her symptoms have not improved.  Not doing anything for exercise.  States she is eating better and has "gained 10 lbs."  States she will go to student health if symptoms recur.

## 2012-09-27 ENCOUNTER — Telehealth: Payer: Self-pay | Admitting: Psychology

## 2012-09-27 NOTE — Telephone Encounter (Signed)
Samantha Norman called to let me know she was diagnosed with Afib today.  She is a bit shocked and a bit relieved to have a diagnosis.  She will follow with cards and she also has a referral to rheumatology.  She says she is managing things well enough and wanted to keep me posted.

## 2012-11-21 ENCOUNTER — Ambulatory Visit (INDEPENDENT_AMBULATORY_CARE_PROVIDER_SITE_OTHER): Payer: BC Managed Care – PPO | Admitting: Psychology

## 2012-11-21 DIAGNOSIS — F329 Major depressive disorder, single episode, unspecified: Secondary | ICD-10-CM

## 2012-11-21 NOTE — Progress Notes (Signed)
Samantha Norman presented for follow-up.  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  She reports things are going reasonably well.  She is happy with her residency with a few exceptions.  She is getting pressure to commit to a second year and this is a great source of stress for her.  Discussed.    Her health issues remain puzzling but seem to not currently interfere with her function.  Roommate situation is reasonable.  She is managing her family dynamics while home.

## 2012-11-21 NOTE — Assessment & Plan Note (Signed)
Report of mood is euthymic.  Affect is consistent.  Anxiety seems well managed too.  Function is high.  She says she has gotten very good feedback at her residency program.  Taking on additional work is one of her ways to manage her stress so this should be watched.  She seems to currently have a reasonable balance of leisure activity.  Overall doing well.

## 2012-11-29 ENCOUNTER — Ambulatory Visit: Payer: 59 | Admitting: Psychology

## 2013-01-30 ENCOUNTER — Ambulatory Visit (INDEPENDENT_AMBULATORY_CARE_PROVIDER_SITE_OTHER): Payer: BC Managed Care – PPO | Admitting: Psychology

## 2013-01-30 DIAGNOSIS — F329 Major depressive disorder, single episode, unspecified: Secondary | ICD-10-CM

## 2013-01-30 NOTE — Progress Notes (Signed)
Nillie presented for a follow-up.  Stress level has increased with difficulty with her roommate and decisions about residency programs next year.  Discussed.    She stopped the Effexor and was off about a week (I think).  In addition to some physical "withdrawal" issues, she reported her mood took a significant downward turn.  She decided that given her demands, now is not the time to stop the medicine.  She does think stopping it helped with some of her chronic neurological issues (tremor, numbness) but she will manage those in favor of better mood control.

## 2013-01-30 NOTE — Assessment & Plan Note (Signed)
Report of mood is anxious.  Affect is consistent.  Reviewed active strategies that she could employ (currently not employing many of them).  These including diaphragmatic breathing, visualization, use of thought record and exercise.  In addition, discussed scheduled worry time and redirecting comments from her roommate to an appropriate place.    She voiced an understanding that she has the power to shift some of her anxiety.  Ultimately - I am confident she will get through and she will have a good outcome (in terms of residency placement) - the question is, how uncomfortable is she going to be in the process.  Will follow up in December or earlier as needed.

## 2013-01-31 ENCOUNTER — Ambulatory Visit: Payer: BC Managed Care – PPO | Admitting: Psychology

## 2013-02-02 ENCOUNTER — Other Ambulatory Visit: Payer: Self-pay

## 2013-02-07 ENCOUNTER — Telehealth: Payer: Self-pay | Admitting: Psychology

## 2013-02-07 NOTE — Telephone Encounter (Signed)
Samantha Norman called to report more physical symptoms and wondering what she should do about them.  She is on a critical care rotation and felt tremulous, nauseous, really cold and felt like she would pass out.  She was told that she was probably having a vasovagal episode.  She eventually threw-up.  This has happened remotely but she has never vomited.  She wonders if it could be stress or whether she should see someone about her symptoms.  Discussed.  I recommended a health psychologist there - someone that might have access to biofeedback equipment so Samantha Norman could learn to manage these uncomfortable body symptoms.  I also recommended exercise - enough to burn off some stress hormones if indeed stress is causing it.  I recommended that she make sure she is cleared for any kind of moderate intensity exercise.  I have recommended several times that she establish care there and specifically mentioned EAP today as well as trying to find a health psychologist.    She will keep me posted.

## 2013-03-09 ENCOUNTER — Ambulatory Visit: Payer: BC Managed Care – PPO | Admitting: Family Medicine

## 2013-03-10 ENCOUNTER — Ambulatory Visit (INDEPENDENT_AMBULATORY_CARE_PROVIDER_SITE_OTHER): Payer: BC Managed Care – PPO | Admitting: Psychology

## 2013-03-10 DIAGNOSIS — F329 Major depressive disorder, single episode, unspecified: Secondary | ICD-10-CM

## 2013-03-10 NOTE — Progress Notes (Signed)
Samantha Norman presented for follow-up.  Samantha Norman just got back from a conference in [place] and took two days off.  Things at the office are stressful.  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  Samantha Norman reports Samantha Norman is trying to focus on her placement for next year and not engage.    Samantha Norman continues to set good limits with her mom and feels positively about her management of this relationship.  Dad continues to recover from his stroke.  Samantha Norman seems like Samantha Norman is managing this well too.    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  Samantha Norman wants to be in [place] but doesn't want to set herself up to not match here and be disappointed again.  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  Considering things.

## 2013-03-10 NOTE — Assessment & Plan Note (Signed)
Overall doing well especially considering the stress at work and at home.  From her report, her work function appears to be very high.  She is an attractive candidate for a lot of positions.  She does not report symptoms of depression and seems to be managing the anxiety symptoms reasonably well.  Physical symptoms are better too.  Reviewed strategies that help her stay well - especially looking at setting healthy limits with people.    She will check-in as necessary and I will likely see her again in January or February depending on her schedule.

## 2013-07-31 ENCOUNTER — Ambulatory Visit (INDEPENDENT_AMBULATORY_CARE_PROVIDER_SITE_OTHER): Payer: BC Managed Care – PPO | Admitting: Psychology

## 2013-07-31 DIAGNOSIS — F329 Major depressive disorder, single episode, unspecified: Secondary | ICD-10-CM

## 2013-07-31 NOTE — Assessment & Plan Note (Signed)
Report of mood is good.  Stress is still an issue.  Affect is within normal limits.  Function remains high.  Main coping for managing stress appears to be throwing herself into her work and talking to trusted people.  She will keep in touch as needed.

## 2013-07-31 NOTE — Progress Notes (Signed)
Samantha StanleyStacey presents for follow-up.  Last appointment was in December.  She is headed to a great Aunt's funeral in [place] today.  Overall reports she is doing well despite the stressful time.  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  Excited about that and about finishing up at [employer].  End of year at [employer] is stressful in addition to some challenging interpersonal dynamics amongst her co-residents.    Taking 37.5 mg of Effexor.  Considering trying to discontinue while at [facility].

## 2013-10-20 ENCOUNTER — Ambulatory Visit (INDEPENDENT_AMBULATORY_CARE_PROVIDER_SITE_OTHER): Payer: BC Managed Care – PPO | Admitting: Psychology

## 2013-10-20 ENCOUNTER — Encounter: Payer: Self-pay | Admitting: Family Medicine

## 2013-10-20 DIAGNOSIS — F330 Major depressive disorder, recurrent, mild: Secondary | ICD-10-CM

## 2013-10-20 DIAGNOSIS — M5442 Lumbago with sciatica, left side: Secondary | ICD-10-CM

## 2013-10-20 DIAGNOSIS — F331 Major depressive disorder, recurrent, moderate: Secondary | ICD-10-CM

## 2013-10-20 NOTE — Progress Notes (Signed)
Samantha StanleyStacey presents for follow-up.  She stopped her Effexor a while back and has been struggling since then.  She states she restarted about a week ago and is starting to feel less anxious.  Unable to eat when reinitiating the Effexor (nausea) but that is abating.  Discussed family relationships including some concerns she has about her dad.    Samantha StanleyStacey had a date recently but determined that it is not a good match.  Discussed pros and cons of dating right now and the barriers she has to developing intimate relationships.

## 2013-10-20 NOTE — Assessment & Plan Note (Addendum)
Report of mood is "stressed."  She looks a little diminished.  She reports she has been so stressed and that things are finally starting to level off.  Per usual, she remains functional at work.  Has friends.    Discussed possibility that work with her mom might help her with intimacy (e.g. Emotional) across the board.  Decided that her mother is not likely a good candidate for this type of work for several different reasons.  Misty StanleyStacey is likely best to continue to set consistent limits and not infuse much emotion into the relationship because things tend to backfire.  Discussed options regarding how to proceed with some thoughts she has about her dad.  She may consider talking to her brother about the situation.  Due to scheduling, we did not meet for the usual 45 minutes today.    She decided that a "passive" approach to dating might be the best bet right now.  She will not actively pursue but if the opportunity comes knocking, she won't necessarily ignore it.    She will follow as needed.

## 2013-10-20 NOTE — Assessment & Plan Note (Signed)
With radiation to BL feet and calves.  Worse with prolonged standing (end of clinic day) or with bending forward.  Radiation described as paresthesias and numbness, worse Left foot.   Is running 3 miles 5 x weekly.  Old tennis shoes.  Has started 1 week ago with lower back stretches.  Little to no core/back exercise or strengthening.  Wears non-custom orthotics most days.    Plan:  Restart core/back strengthening program.  Has hx/o stable pars defect L5 from 2007 -- to resume those exercises. May need MRI as now with pain and questionable weakness on Right side, mostly foot.

## 2013-10-20 NOTE — Assessment & Plan Note (Signed)
This has worsened she has returned to [place].  Off Effexor since May.  Realized this wasn't great idea and restarted last week 37.5 mg.  Was on 75 mg while in [place].  Plan is to increase back to 75 mg once daily today.  Continue working on managing stress with techniques provided by Dr. Pascal LuxKane.

## 2013-10-30 ENCOUNTER — Other Ambulatory Visit: Payer: Self-pay | Admitting: Family Medicine

## 2013-10-30 ENCOUNTER — Telehealth: Payer: Self-pay | Admitting: Pharmacist

## 2013-10-30 ENCOUNTER — Other Ambulatory Visit: Payer: Self-pay | Admitting: Pharmacist

## 2013-10-30 DIAGNOSIS — H539 Unspecified visual disturbance: Secondary | ICD-10-CM

## 2013-10-30 MED ORDER — VENLAFAXINE HCL 75 MG PO TABS: 75.0000 mg | ORAL_TABLET | Freq: Two times a day (BID) | ORAL | Status: DC

## 2013-10-30 MED ORDER — VENLAFAXINE HCL ER 75 MG PO CP24: 75.0000 mg | ORAL_CAPSULE | Freq: Every day | ORAL | Status: DC

## 2013-10-30 NOTE — Telephone Encounter (Signed)
[facility]  Reordered previously prescribed agent at requested dose and routed to Dr. Gwendolyn GrantWalden.

## 2013-10-30 NOTE — Progress Notes (Signed)
Dr. Gwendolyn GrantWalden out of town---pt needs refill. Reviewed med dosing in chart, refill today Denny LevySara Man Bonneau

## 2013-11-28 HISTORY — PX: OTHER SURGICAL HISTORY: SHX169

## 2013-12-13 ENCOUNTER — Encounter: Payer: Self-pay | Admitting: Family Medicine

## 2013-12-13 ENCOUNTER — Other Ambulatory Visit: Payer: Self-pay | Admitting: Family Medicine

## 2013-12-13 DIAGNOSIS — H5712 Ocular pain, left eye: Secondary | ICD-10-CM

## 2013-12-13 NOTE — Progress Notes (Signed)
Patient ID: Sanari Offner, female   DOB: 06-May-1985, 28 y.o.   MRN: [id number] Sydnie came and saw me last week (Wed 9/9) because she was having "blurry vision."  She discussed this as "floaters."  I did an ophthalmic exam, but had much difficulty visualizing her fundus.  I think referred her to an ophalmologist for better exam.  She had been told previously by an optometrist that she had a "difficult eye exam."  The ophthalmologist saw her on Monday, 9/14.  She was very concerned for MS based on symptoms.  Wendelin had developed Left eye pain (same eye as floaters) over the weekend.  The ophtho did visual field testing yesterday, 9/15, and noted a visual field defect.  She strongly encouraged Destanee to return to a neurologist.   Quenisha called her neurologist, but was told she would need a referral if she was to be seen prior to December.  I am concerned as well and will put in an urgent referral back to her neurologist.

## 2013-12-20 ENCOUNTER — Encounter: Payer: Self-pay | Admitting: Neurology

## 2013-12-20 ENCOUNTER — Ambulatory Visit (INDEPENDENT_AMBULATORY_CARE_PROVIDER_SITE_OTHER): Payer: 59 | Admitting: Neurology

## 2013-12-20 VITALS — BP 126/91 | HR 89 | Ht 68.0 in | Wt 135.0 lb

## 2013-12-20 DIAGNOSIS — H5310 Unspecified subjective visual disturbances: Secondary | ICD-10-CM

## 2013-12-20 DIAGNOSIS — R209 Unspecified disturbances of skin sensation: Secondary | ICD-10-CM

## 2013-12-20 DIAGNOSIS — R51 Headache: Secondary | ICD-10-CM

## 2013-12-20 DIAGNOSIS — R202 Paresthesia of skin: Secondary | ICD-10-CM

## 2013-12-20 MED ORDER — PREDNISONE 5 MG PO TABS: ORAL_TABLET | ORAL | Status: DC

## 2013-12-20 NOTE — Progress Notes (Signed)
Reason for visit: Visual disturbance  Samantha Norman is an 28 y.o. female  History of present illness:  Samantha Norman is a 28 year old left-handed white female with a history of paresthesias involving both lower extremities and hands. The patient has undergone a workup in the past that included MRI of the brain, cervical spine, and thoracic spine which were normal. A brainstem auditory evoked response test was done, as she was also complaining of dizziness episodes, and this was normal. The patient has had persistence of the sensory complaints. The patient more recently has had onset of some blurring of vision involving the left eye, with "shooting stars" off and on in the visual field of the left eye only. The patient will have some headache off and on, but she currently does not have a headache. The patient has had some photophobia recently with the visual complaints. She does report a history of migraine, occurring on average once a month. She has gone to her ophthalmologist, and she is sent to this office for an evaluation. The patient denies any balance changes or problems controlling the bowels or the bladder. She denies any memory or concentration problems. She has not had any weakness of the extremities.  Past Medical History  Diagnosis Date  . Anxiety and depression   . Dizziness   . Hx of migraine headaches   . Fracture of lumbar spine     Hx. of fracture    Past Surgical History  Procedure Laterality Date  . None      Family History  Problem Relation Age of Onset  . Hypertension Mother   . Alzheimer's disease Maternal Uncle   . Cancer - Prostate Paternal Uncle   . Cancer - Prostate Paternal Grandfather   . Stroke Father     Social history:  reports that she has never smoked. She does not have any smokeless tobacco history on file. She reports that she drinks alcohol. Her drug history is not on file.   No Known Allergies  Medications:  Current Outpatient Prescriptions on  File Prior to Visit  Medication Sig Dispense Refill  . norethindrone-ethinyl estradiol 1/35 (NECON 1/35, 28,) tablet Take 1 tablet by mouth daily.  3 Package  4  . venlafaxine XR (EFFEXOR XR) 75 MG 24 hr capsule Take 1 capsule (75 mg total) by mouth daily with breakfast.  30 capsule  1   No current facility-administered medications on file prior to visit.    ROS:  Out of a complete 14 system review of symptoms, the patient complains only of the following symptoms, and all other reviewed systems are negative.  Fatigue Blurred vision, loss of vision, eye pain Feeling hot, cold Headache, numbness, tremor  Blood pressure 126/91, pulse 89, height  (1.727 m), weight 135 lb (61.236 kg).  Physical Exam  General: The patient is alert and cooperative at the time of the examination.  Skin: No significant peripheral edema is noted.   Neurologic Exam  Mental status: The patient is oriented x 3.  Cranial nerves: Facial symmetry is present. Speech is normal, no aphasia or dysarthria is noted. Extraocular movements are full. Visual fields are full. Pupils are equal, round, and reactive to light. Discs are flat bilaterally.  Motor: The patient has good strength in all 4 extremities.  Sensory examination: Sensation is symmetric to pinprick, soft touch, vibration sensation and position sense on all 4 extremities  Coordination: The patient has good finger-nose-finger and heel-to-shin bilaterally.  Gait and station: The  patient has a normal gait. Tandem gait is normal. Romberg is negative. No drift is seen.  Reflexes: Deep tendon reflexes are symmetric, but are quite brisk at the knees and ankles, the patient has sustained ankle clonus bilaterally. Toes are downgoing bilaterally.   Assessment/Plan:  1. Visual disturbance, OS, possible migraine  2. Sensory dysesthesias all 4 extremities  The patient will be sent for further evaluation to include MRI of the brain with and without  gadolinium enhancement. The patient will have a visual evoked response test, and she will be placed on a prednisone Dosepak, 5 mg 6 day pack. She will followup in 6-8 weeks. Depending upon the results of the above studies, the patient may require further workup.  Marlan Palau MD 12/20/2013 8:44 PM  [facility] [address] [place], [place] [address]  Phone [phone] Fax [phone]

## 2013-12-20 NOTE — Patient Instructions (Signed)

## 2013-12-22 ENCOUNTER — Other Ambulatory Visit: Payer: Self-pay | Admitting: Family Medicine

## 2013-12-22 ENCOUNTER — Ambulatory Visit (INDEPENDENT_AMBULATORY_CARE_PROVIDER_SITE_OTHER): Payer: 59 | Admitting: Psychology

## 2013-12-22 ENCOUNTER — Ambulatory Visit (INDEPENDENT_AMBULATORY_CARE_PROVIDER_SITE_OTHER): Payer: 59

## 2013-12-22 DIAGNOSIS — H5310 Unspecified subjective visual disturbances: Secondary | ICD-10-CM

## 2013-12-22 DIAGNOSIS — R51 Headache: Secondary | ICD-10-CM

## 2013-12-22 DIAGNOSIS — R202 Paresthesia of skin: Secondary | ICD-10-CM

## 2013-12-22 DIAGNOSIS — F330 Major depressive disorder, recurrent, mild: Secondary | ICD-10-CM

## 2013-12-22 DIAGNOSIS — Z124 Encounter for screening for malignant neoplasm of cervix: Secondary | ICD-10-CM

## 2013-12-22 MED ORDER — VENLAFAXINE HCL ER 75 MG PO CP24: 75.0000 mg | ORAL_CAPSULE | Freq: Every day | ORAL | Status: DC

## 2013-12-22 MED ORDER — NORETHINDRONE-ETH ESTRADIOL 1-35 MG-MCG PO TABS: 1.0000 | ORAL_TABLET | Freq: Every day | ORAL | Status: DC

## 2013-12-22 NOTE — Progress Notes (Signed)
Needs Pap smear.  Would prefer outside clinic as she works with all the clinicians here. Will refer to Gynecology.

## 2013-12-22 NOTE — Progress Notes (Signed)
Samantha Norman presents for follow-up.  She is wanting to touch base on some significant stressors in her life:  Her mom's recent diagnosis of breast cancer and some re-occuring health concerns of her own that need further work-up.  Also touched based on work and social life.

## 2013-12-22 NOTE — Assessment & Plan Note (Signed)
No report on mood.  Affect is within normal limits.  She seems to be managing this stress very well.  Seems to have a clear sense of what she can and can not control and does not appear to be angst ridden about the latter.  She seems appropriately concerned and frustrated about not knowing what is causing her symptoms.    She appears, at least at the present time, to have settled into a nice place with regards to her mom.  She has developed areas where she can communicate openly and other areas, she sets clear limits.  Socially, she has had some recent interactions that have been good.    She will follow as needed and keep me posted about her health issues in between.

## 2013-12-25 ENCOUNTER — Telehealth: Payer: Self-pay | Admitting: Neurology

## 2013-12-25 NOTE — Telephone Encounter (Signed)
I called patient. The visual evoked response test is normal. I believe that the visual changes are likely secondary to a migraine-type event.

## 2013-12-25 NOTE — Procedures (Signed)
    History:   Samantha Norman is a 28 year old patient with a history of paresthesias involving the lower extremities. She has a history of migraine, and onset of visual disturbance affecting the left eye with "shooting stars". The patient is being evaluated for possible optic nerve dysfunction.   Description: The visual evoked response test was performed today using 32 x 32 check sizes. The absolute latencies for the N1 and the P100 wave forms were within normal limits bilaterally. The amplitudes for the P100 wave forms were also within normal limits bilaterally. The visual acuity was 20/30 OD and 20/30 OS corrected.  Impression:  The visual evoked response test above was within normal limits bilaterally. No evidence of conduction slowing was seen within the anterior visual pathways on either side on today's evaluation.

## 2013-12-27 ENCOUNTER — Ambulatory Visit (INDEPENDENT_AMBULATORY_CARE_PROVIDER_SITE_OTHER): Payer: 59

## 2013-12-27 DIAGNOSIS — R51 Headache: Secondary | ICD-10-CM

## 2013-12-27 DIAGNOSIS — H5310 Unspecified subjective visual disturbances: Secondary | ICD-10-CM

## 2013-12-27 DIAGNOSIS — R202 Paresthesia of skin: Secondary | ICD-10-CM

## 2013-12-27 DIAGNOSIS — R209 Unspecified disturbances of skin sensation: Secondary | ICD-10-CM

## 2013-12-27 MED ORDER — GADOPENTETATE DIMEGLUMINE 469.01 MG/ML IV SOLN: 13.0000 mL | Freq: Once | INTRAVENOUS | Status: AC | PRN

## 2014-01-02 ENCOUNTER — Other Ambulatory Visit: Payer: Self-pay | Admitting: Family Medicine

## 2014-01-02 ENCOUNTER — Encounter: Payer: Self-pay | Admitting: Obstetrics & Gynecology

## 2014-01-02 DIAGNOSIS — R202 Paresthesia of skin: Secondary | ICD-10-CM

## 2014-01-02 DIAGNOSIS — R5382 Chronic fatigue, unspecified: Secondary | ICD-10-CM

## 2014-01-02 DIAGNOSIS — E559 Vitamin D deficiency, unspecified: Secondary | ICD-10-CM

## 2014-01-02 LAB — CBC WITH DIFFERENTIAL/PLATELET
Basophils Absolute: 0.1 10*3/uL (ref 0.0–0.1)
Basophils Relative: 1 % (ref 0–1)
Eosinophils Absolute: 0 10*3/uL (ref 0.0–0.7)
Eosinophils Relative: 0 % (ref 0–5)
HEMATOCRIT: 41.6 % (ref 36.0–46.0)
Hemoglobin: 15 g/dL (ref 12.0–15.0)
LYMPHS PCT: 42 % (ref 12–46)
Lymphs Abs: 2.5 10*3/uL (ref 0.7–4.0)
MCH: 32.8 pg (ref 26.0–34.0)
MCHC: 36.1 g/dL — ABNORMAL HIGH (ref 30.0–36.0)
MCV: 91 fL (ref 78.0–100.0)
Monocytes Absolute: 0.5 10*3/uL (ref 0.1–1.0)
Monocytes Relative: 8 % (ref 3–12)
NEUTROS ABS: 2.9 10*3/uL (ref 1.7–7.7)
NEUTROS PCT: 49 % (ref 43–77)
PLATELETS: 241 10*3/uL (ref 150–400)
RBC: 4.57 MIL/uL (ref 3.87–5.11)
RDW: 13.3 % (ref 11.5–15.5)
WBC: 5.9 10*3/uL (ref 4.0–10.5)

## 2014-01-02 MED ORDER — PROPRANOLOL HCL 20 MG PO TABS: ORAL_TABLET | ORAL | Status: DC

## 2014-01-03 LAB — COMPREHENSIVE METABOLIC PANEL
ALBUMIN: 4.7 g/dL (ref 3.5–5.2)
ALT: 17 U/L (ref 0–35)
AST: 19 U/L (ref 0–37)
Alkaline Phosphatase: 25 U/L — ABNORMAL LOW (ref 39–117)
BUN: 9 mg/dL (ref 6–23)
CALCIUM: 10.1 mg/dL (ref 8.4–10.5)
CHLORIDE: 104 meq/L (ref 96–112)
CO2: 25 mEq/L (ref 19–32)
Creat: 0.84 mg/dL (ref 0.50–1.10)
Glucose, Bld: 67 mg/dL — ABNORMAL LOW (ref 70–99)
POTASSIUM: 4.1 meq/L (ref 3.5–5.3)
Sodium: 139 mEq/L (ref 135–145)
Total Bilirubin: 1.4 mg/dL — ABNORMAL HIGH (ref 0.2–1.2)
Total Protein: 7.7 g/dL (ref 6.0–8.3)

## 2014-01-03 LAB — IBC PANEL
%SAT: 45 % (ref 20–55)
TIBC: 417 ug/dL (ref 250–470)
UIBC: 231 ug/dL (ref 125–400)

## 2014-01-03 LAB — TSH: TSH: 1.243 u[IU]/mL (ref 0.350–4.500)

## 2014-01-03 LAB — VITAMIN D 25 HYDROXY (VIT D DEFICIENCY, FRACTURES): Vit D, 25-Hydroxy: 20 ng/mL — ABNORMAL LOW (ref 30–89)

## 2014-01-03 LAB — IRON: IRON: 186 ug/dL — AB (ref 42–145)

## 2014-01-03 LAB — CK: Total CK: 58 U/L (ref 7–177)

## 2014-01-03 LAB — ANA: ANA: NEGATIVE

## 2014-01-03 LAB — VITAMIN B12: Vitamin B-12: 502 pg/mL (ref 211–911)

## 2014-01-10 ENCOUNTER — Ambulatory Visit (INDEPENDENT_AMBULATORY_CARE_PROVIDER_SITE_OTHER): Payer: 59 | Admitting: Psychology

## 2014-01-10 DIAGNOSIS — F330 Major depressive disorder, recurrent, mild: Secondary | ICD-10-CM

## 2014-01-10 NOTE — Assessment & Plan Note (Signed)
Report of mood is "stressed."  She thinks the propranolol has actually reduced her normal sense of "anxiety" which for her - is different than her current feeling of being stressed.  Multiple factors play a role here including her current work load, job search and decisions regarding this, and relationships.  Discussed the idea of a romantic relationship and what that would be like for her.  Will need to do further work here.  She elected to use Fitness Pal to track calories to ensure that she is getting enough - she likely overestimates her intake.  She will also consider taking her Trazodone for sleep for at least a little while.  Currently having difficulty with staying asleep.  + nightmares and she continues to have night sweats.  Reviewed setting boundaries with her mother.  Will follow in two weeks.

## 2014-01-10 NOTE — Progress Notes (Signed)
Samantha StanleyStacey presents for follow-up.  We touched on a number of different subjects including work life, relationship with her mother, social activities and her health and well-being.  Regarding the latter, she is variable in her eating and having difficulty sleeping.  She thinks she could make improvements here.  Discussed.  Propranolol on board to help with potential migraines. She is getting used to this medicine.

## 2014-01-23 ENCOUNTER — Telehealth: Payer: Self-pay | Admitting: Neurology

## 2014-01-23 NOTE — Telephone Encounter (Signed)
The patient has been seen by Dr. Allena KatzPatel from ophthalmology. No significant visual field changes were noted, the patient still had migraine.

## 2014-01-24 ENCOUNTER — Ambulatory Visit (INDEPENDENT_AMBULATORY_CARE_PROVIDER_SITE_OTHER): Payer: 59 | Admitting: Psychology

## 2014-01-24 DIAGNOSIS — F330 Major depressive disorder, recurrent, mild: Secondary | ICD-10-CM

## 2014-01-24 NOTE — Assessment & Plan Note (Signed)
No report on mood.  Affect is within normal limits.  Seems less "stressed" than visit before.  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  Also coping by controlling what she can (writing a business plan for the job she wants the most).  Overall, seems to be functioning at a high level.    Suggested she pay attention to her gut and prioritize the interviews in [place] so that she doesn't stretch herself too thin.  This means NOT interviewing at places that she won't likely live [facility]  She asked to follow up in two weeks.

## 2014-01-24 NOTE — Progress Notes (Signed)
Samantha Norman presents for follow-up.  She continues with several significant stressors including her mom's recent diagnosis of breast cancer and her on-going job Financial controllersearch.  We focused on these two things.  She has a winter meeting coming up where she can have interviews with potential job sites across the country.  She is getting a lot of offers and is trying to decide if it is best to keep all of the doors open or limit herself to what she thinks is the best geographic location for her - here.    Her mom had surgery and is recovering.  She has been snappy with Samantha Norman and this has been a bit hard to manage.  Her mom should get the results from the biopsy soon.  She has not been tracking calories.  Still has floaters in eyes and eye doctor was questioning whether it could be diet related.  Still investigating.  Sleep issues remain - maybe a touch better.  Brother may be transferring to [employer] and she is considering having him as a roommate if this happens.

## 2014-02-01 ENCOUNTER — Ambulatory Visit (INDEPENDENT_AMBULATORY_CARE_PROVIDER_SITE_OTHER): Payer: 59 | Admitting: Psychology

## 2014-02-01 DIAGNOSIS — F4323 Adjustment disorder with mixed anxiety and depressed mood: Secondary | ICD-10-CM

## 2014-02-01 NOTE — Progress Notes (Signed)
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  Her main struggle is balancing her desire for a certain location (here) with the closet approximation of her ideal job Buyer, retail(amcare / teaching).  In the end, connections are centra for her and losing them is the worst possible outcome.  Also discussed Christmas plans as the holidays can be a difficulty time to manage tricky family dynamics.  She [place] [facility] [facility] [place] [place] over [place].  This is a good thing.

## 2014-02-01 NOTE — Assessment & Plan Note (Signed)
Report of mood is stressed.  Affect is appropriate.  She benefits from talking things through.  When asked, she was able to identify several behavioral strategies to help keep her well during this stressful time period.  Cognitive strategies were not among them.  Discussed how to stay present focused and not get too catastrophic in her thinking.  Will follow as scheduled.

## 2014-02-09 ENCOUNTER — Ambulatory Visit (INDEPENDENT_AMBULATORY_CARE_PROVIDER_SITE_OTHER): Payer: 59 | Admitting: Psychology

## 2014-02-09 DIAGNOSIS — F4323 Adjustment disorder with mixed anxiety and depressed mood: Secondary | ICD-10-CM

## 2014-02-09 NOTE — Progress Notes (Signed)
Samantha Norman presents for follow-up.  She has not been sleeping well the last several days secondary to pain in her legs that she thinks is stemming from her back.  She is going to investigate a bit further today.  Reports about 5 hours per night rather than her preferred 8-9.  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  She has submitted several applications, has interviews at a mid-year conference coming up, and has submitted a business proposal to create a position at [employer].  She feels as though she has done what she can and needs to take a step back.  Discussed other aspect of life briefly including her mom, a friend, and the holidays.

## 2014-02-09 NOTE — Assessment & Plan Note (Signed)
Mood is euthymic.  Affect is consistent.  Thoughts are clear and goal directed.  Appears less stressed than last visit.  Asked to follow regularly as visits are thought to help keep her stress at a more manageable level.  Scheduled for:  11/25 at 10:00.

## 2014-02-15 ENCOUNTER — Encounter: Payer: 59 | Admitting: Obstetrics & Gynecology

## 2014-02-19 ENCOUNTER — Ambulatory Visit: Payer: 59 | Admitting: Neurology

## 2014-02-20 ENCOUNTER — Ambulatory Visit (INDEPENDENT_AMBULATORY_CARE_PROVIDER_SITE_OTHER): Payer: 59 | Admitting: Neurology

## 2014-02-20 ENCOUNTER — Encounter: Payer: Self-pay | Admitting: Neurology

## 2014-02-20 VITALS — BP 107/73 | HR 64 | Ht 68.0 in | Wt 139.0 lb

## 2014-02-20 DIAGNOSIS — G441 Vascular headache, not elsewhere classified: Secondary | ICD-10-CM

## 2014-02-20 DIAGNOSIS — H5310 Unspecified subjective visual disturbances: Secondary | ICD-10-CM

## 2014-02-20 MED ORDER — PROPRANOLOL HCL ER 60 MG PO CP24: 60.0000 mg | ORAL_CAPSULE | Freq: Every day | ORAL | Status: DC

## 2014-02-20 NOTE — Patient Instructions (Signed)

## 2014-02-20 NOTE — Progress Notes (Signed)
Reason for visit: Paresthesias, visual disturbance  Samantha Norman is an 28 y.o. female  History of present illness:  Samantha Norman is a 28 year old left-handed white female with a history of headaches, and subjective visual disturbances. The patient has had an extensive workup that has included MRI of the brain, cervical spine, and thoracic spine. The patient has no evidence of demyelinating disease. A visual evoked response test was unremarkable. The patient has seen an ophthalmologist who did not see anything abnormal within the eyes. The patient has been placed on propranolol, currently on 20 mg twice daily. This has helped the headache some, but she continues to have some problems with visual changes. She also reports some numbness sensations in the legs. She returns for an evaluation.  Past Medical History  Diagnosis Date  . Anxiety and depression   . Dizziness   . Hx of migraine headaches   . Fracture of lumbar spine     Hx. of fracture    Past Surgical History  Procedure Laterality Date  . None      Family History  Problem Relation Age of Onset  . Hypertension Mother   . Breast cancer Mother   . Alzheimer's disease Maternal Uncle   . Cancer - Prostate Paternal Uncle   . Cancer - Prostate Paternal Grandfather   . Stroke Father     Social history:  reports that she has never smoked. She has never used smokeless tobacco. She reports that she drinks alcohol. Her drug history is not on file.   No Known Allergies  Medications:  Current Outpatient Prescriptions on File Prior to Visit  Medication Sig Dispense Refill  . norethindrone-ethinyl estradiol 1/35 (NECON 1/35, 28,) tablet Take 1 tablet by mouth daily. 3 Package 1  . venlafaxine XR (EFFEXOR XR) 75 MG 24 hr capsule Take 1 capsule (75 mg total) by mouth daily with breakfast. 30 capsule 6   No current facility-administered medications on file prior to visit.    ROS:  Out of a complete 14 system review of symptoms, the  patient complains only of the following symptoms, and all other reviewed systems are negative.  Fatigue, excessive sweating Eye pain, blurred vision Shortness of breath Daytime sleepiness Back pain Headache, numbness  Blood pressure 107/73, pulse 64, height 5\' 8"  (1.727 m), weight 139 lb (63.05 kg).  Physical Exam  General: The patient is alert and cooperative at the time of the examination.  Neuromuscular: Range of movement of the cervical spine is full.  Skin: No significant peripheral edema is noted.   Neurologic Exam  Mental status: The patient is oriented x 3.  Cranial nerves: Facial symmetry is present. Speech is normal, no aphasia or dysarthria is noted. Extraocular movements are full. Visual fields are full.  Motor: The patient has good strength in all 4 extremities.  Sensory examination: Soft touch sensation is symmetric on the face, arms, and legs.  Coordination: The patient has good finger-nose-finger and heel-to-shin bilaterally.  Gait and station: The patient has a normal gait. Tandem gait is normal. Romberg is negative. No drift is seen.  Reflexes: Deep tendon reflexes are symmetric.   MRI brain 12/28/13:  IMPRESSION:  Normal MRI brain (with and without).      Assessment/Plan:  1. History of headache, likely migraine  2. Subjective visual disturbance  The patient is doing relatively well this point. We will convert to the long-acting propranolol, 60 mg capsule. The patient will follow-up in 6 months. She will contact me  if she is having side effects on the medication. I suspect that there is a high degree of anxiety underlying some of her somatic symptomatology.  Marlan Palau. Keith Willis MD 02/20/2014 7:04 PM  [facility] [address] [place], [place] [id number]  Phone [phone] Fax [phone]

## 2014-02-21 ENCOUNTER — Ambulatory Visit (INDEPENDENT_AMBULATORY_CARE_PROVIDER_SITE_OTHER): Payer: 59 | Admitting: Psychology

## 2014-02-21 DIAGNOSIS — F4323 Adjustment disorder with mixed anxiety and depressed mood: Secondary | ICD-10-CM

## 2014-02-21 NOTE — Assessment & Plan Note (Signed)
Report of mood currently is euthymic.  Affect is consistent.  Had a major setback recently that she appeared to manage fairly well, given what it meant to her.  She was tearful and somewhat catastrophic in her thinking but was able to recognize that.  She seemed to be able to regroup and develop a plan that is reasonable both for herself and continued work with her supervisor.  Discussed boundaries and the difficulty identifying them and maintaining them.  Also discussed what factors are shaping her decision making regarding job hunt and how "safety" might not be the best factor to consider.    She wishes to follow more frequently during this job search as a source of support and a Science writersounding board.

## 2014-02-21 NOTE — Progress Notes (Signed)
Samantha Norman presents for follow-up.  She and her supervisor have been discussing job possibilities.  Samantha Norman was hopeful she could remain in the practice she is currently in but learned recently that this is not the case.  It called into question the history of the relationship and also the future.  Discussed at length.  She went on a date.  This went well and she is excited for the opportunity.

## 2014-02-26 ENCOUNTER — Other Ambulatory Visit: Payer: Self-pay | Admitting: Family Medicine

## 2014-02-26 MED ORDER — PROPRANOLOL HCL 20 MG PO TABS: 20.0000 mg | ORAL_TABLET | Freq: Three times a day (TID) | ORAL | Status: DC

## 2014-03-09 ENCOUNTER — Ambulatory Visit (INDEPENDENT_AMBULATORY_CARE_PROVIDER_SITE_OTHER): Payer: 59 | Admitting: Psychology

## 2014-03-09 DIAGNOSIS — F4323 Adjustment disorder with mixed anxiety and depressed mood: Secondary | ICD-10-CM

## 2014-03-09 NOTE — Assessment & Plan Note (Signed)
Report of mood is euthymic.  Affect is consistent.  Is not reporting significant somatic symptoms (I did not ask about them though).  Overall, seems to be managing her stress reasonably well.  Will follow as needed.

## 2014-03-09 NOTE — Progress Notes (Signed)
Samantha StanleyStacey presents for follow-up.  She reports she is getting a better handle on the job search having just gotten back from mid-year.  She has opportunities both in and outside of [place] and would prefer to stay here.  Discussed how she is approaching this with her mother.  Followed up on the dating relationship.  She learned some interesting things and overall, this has been a valuable experience even though the relationship did not pan out.

## 2014-03-28 ENCOUNTER — Ambulatory Visit (INDEPENDENT_AMBULATORY_CARE_PROVIDER_SITE_OTHER): Payer: 59 | Admitting: Psychology

## 2014-03-28 DIAGNOSIS — F4323 Adjustment disorder with mixed anxiety and depressed mood: Secondary | ICD-10-CM

## 2014-03-28 NOTE — Progress Notes (Signed)
Samantha StanleyStacey presents for follow-up.  Her dad had another stroke recently (first was a little over a year ago).  They can not find a cause for the strokes and the family dynamics are challenging.  Samantha StanleyStacey is heavily involved and states she feels bad for not being more "emotional" about the situation.    Job search continues.  It is stressful.  She has been dating a few people through [employer].  She has another date tonight.  Sleep is erratic with some poor sleep reported followed by 15 hours of sleep.  Denies mood changes.  Just feels drained and exhausted - especially after a weekend away with her mother for Christmas.

## 2014-03-28 NOTE — Assessment & Plan Note (Signed)
Mood is reported as euthymic.  She appears confident in her abilities to manage the stress.  Pushed her on the emotion tied to her father's medical difficulties.  She is very intellectual and problem-solving which is serving her well currently.  She attempted to get more connected with her mom on their weekend away but found confirmation that there just is not much there.  Her fatigue post weekend could be due to the amount of effort she put out during the time away with her mother.    I am impressed with her report of her dating experience.  She appears to be taking calculated risks and is weathering the dating challenges reasonably well.  She may suspend her efforts until the job situation gets a bit more settled.  She elected to schedule for two weeks and will touch base as needed.  Overall, doing well considering her current stressors.

## 2014-04-06 ENCOUNTER — Encounter: Payer: Self-pay | Admitting: Neurology

## 2014-04-11 ENCOUNTER — Ambulatory Visit (INDEPENDENT_AMBULATORY_CARE_PROVIDER_SITE_OTHER): Payer: 59 | Admitting: Psychology

## 2014-04-11 DIAGNOSIS — F4323 Adjustment disorder with mixed anxiety and depressed mood: Secondary | ICD-10-CM

## 2014-04-11 NOTE — Progress Notes (Signed)
Samantha Norman presents for follow-up.  Overall she is doing well.  She has an interview Friday and is feeling reasonably confident.  She continues to navigate her father's health issues and family dynamics as well as the dating world. No physical symptoms; she thinks it is strange that they get better when she is feeling more stressed and worse when she is feeling more relaxed.  Sleep has been disrupted some but not across the board.  This has been an especially busy week.

## 2014-04-11 NOTE — Assessment & Plan Note (Signed)
Mood is euthymic.  Affect is the same.  Thoughts are clear and goal directed and do not appear to be catastrophic in nature; well reasoned.  Appears to be managing the stress well and not engaging in maladaptive coping.  Will follow in two weeks or sooner as needed.  She will let me know how her interview went.

## 2014-04-25 ENCOUNTER — Ambulatory Visit (INDEPENDENT_AMBULATORY_CARE_PROVIDER_SITE_OTHER): Payer: 59 | Admitting: Psychology

## 2014-04-25 ENCOUNTER — Other Ambulatory Visit: Payer: Self-pay | Admitting: Family Medicine

## 2014-04-25 DIAGNOSIS — F4323 Adjustment disorder with mixed anxiety and depressed mood: Secondary | ICD-10-CM

## 2014-04-25 DIAGNOSIS — E559 Vitamin D deficiency, unspecified: Secondary | ICD-10-CM

## 2014-04-25 NOTE — Progress Notes (Signed)
Samantha StanleyStacey presents for follow-up.  She has yet to hear about her job prospect despite being told that she would hear by now.  This has been very stressful.  Her eating and sleep are disturbed secondary to this as well as some additional stressors (had to put childhood dog down, father's health issues, dating).    She is not dating anyone presently - last person was just not a very good match - and she plans to suspend efforts until the job situation is settled.

## 2014-04-25 NOTE — Assessment & Plan Note (Signed)
Report of mood is mildly depressed.  The chronic stress seems to be wearing her out.  Work function remains high.  Thoughts are clear and goal directed.  Of note, she made the statement that she expects she will always have some "low level of depression."  I challenged her on this "label" suggesting that at times, she might be experiencing normal variation in energy and mood and that labeling herself as "depressed" (for life) may be counter-productive.  We may revisit if worthwhile.    She elected to schedule an appointment in a few weeks.  She will let me know about her job when she finds out something.

## 2014-04-26 ENCOUNTER — Encounter: Payer: Self-pay | Admitting: Family Medicine

## 2014-04-26 LAB — VITAMIN D 25 HYDROXY (VIT D DEFICIENCY, FRACTURES): VIT D 25 HYDROXY: 70 ng/mL (ref 30–100)

## 2014-04-26 NOTE — Progress Notes (Signed)
Obtained vitamin D level to FU on prior low Vit D level in Oct 2015.  Level returned elevated to 70.  [facility]  She has been taking 8000 IU's daily.    Has been having alternating HAs and visual changes, which are gradually improving.  Seen by neurologist and ophtho.  Repeat MRI showed no evidence of MS.  No abdominal pain or nausea/vomiting.  Has inconsistent headaches and "seeing stars." These are slowly improving on Propranolol, which has also somewhat helped her anxiety.    Recommended she stop her vit d supplementation.  Can recheck in 3 months.  No other signs/symptoms of hypercalcemia.

## 2014-05-09 ENCOUNTER — Ambulatory Visit (INDEPENDENT_AMBULATORY_CARE_PROVIDER_SITE_OTHER): Payer: 59 | Admitting: Psychology

## 2014-05-09 DIAGNOSIS — F4323 Adjustment disorder with mixed anxiety and depressed mood: Secondary | ICD-10-CM

## 2014-05-09 NOTE — Assessment & Plan Note (Signed)
Report of mood is mildly depressed.  Work function is good.  She is able to take care of herself at home but does not seem to be "engaging" much in life outside of work.  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  Next visit may talk about female relationships and scheduling regular activities.  The dating provided a distraction and things to do but came at too high of a cost in terms of stress given what else is going on.    She will keep me updated regarding the job situation.

## 2014-05-09 NOTE — Progress Notes (Signed)
Samantha StanleyStacey presents for follow-up.  She continues to be in the midst of a challenging job search that is stressful and making eating and sleeping more difficult.  Her mood is reported as "stressed" but at variable levels.  She is feeling more down that usual.    Dad's health is stable.  Continues to spend time with family members.  Doesn't fill her up in any appreciable way but gets something "checked off" the list.

## 2014-05-24 ENCOUNTER — Ambulatory Visit: Payer: 59 | Admitting: Psychology

## 2014-05-28 ENCOUNTER — Ambulatory Visit (INDEPENDENT_AMBULATORY_CARE_PROVIDER_SITE_OTHER): Payer: 59 | Admitting: Psychology

## 2014-05-28 DIAGNOSIS — F4323 Adjustment disorder with mixed anxiety and depressed mood: Secondary | ICD-10-CM

## 2014-05-28 NOTE — Progress Notes (Signed)
Misty StanleyStacey present for follow-up.  Focus of our discussion was the fall out from some recent news she had from a job situation.  She learned on Friday she would not be getting the job. She did reasonably well over the weekend but was grateful for the distraction of work starting again.  Discussed next steps and best approaches moving forward.

## 2014-05-28 NOTE — Assessment & Plan Note (Signed)
Did not report on mood.  Affect is within normal limits.  She is not tearful and her thinking seems very clear.  She is weighing advantages and disadvantages of several opportunities.  The number of things to consider and the potential weight of it all is overwhelming.  Discussed how she could approach.  She is most interested in staying in the area.  She has an interview in [place], however, tomorrow and plans to try to keep an open mind.  She wished to schedule for next week.  Will continue to follow and space out meetings when her stress level decreases.

## 2014-06-01 ENCOUNTER — Other Ambulatory Visit: Payer: Self-pay | Admitting: Family Medicine

## 2014-06-01 MED ORDER — VENLAFAXINE HCL ER 75 MG PO CP24: 75.0000 mg | ORAL_CAPSULE | Freq: Every day | ORAL | Status: DC

## 2014-06-05 ENCOUNTER — Ambulatory Visit (INDEPENDENT_AMBULATORY_CARE_PROVIDER_SITE_OTHER): Payer: 59 | Admitting: Psychology

## 2014-06-05 DIAGNOSIS — F4323 Adjustment disorder with mixed anxiety and depressed mood: Secondary | ICD-10-CM

## 2014-06-05 NOTE — Progress Notes (Signed)
Samantha StanleyStacey presents for follow-up.  She is feeling fatigued today having spent the weekend in [place] and not sleeping as well.  She has noted a return of some of her intermittent symptoms like weakness in her legs and some mild tremors in her hands.  She continues to wonder if these symptoms could be stress related vs something autoimmune that has yet to declare itself.  She is feeling much better about not getting the job she wanted.  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  She also has some other interviews coming up.  It remains stressful but she says she is feeling okay about things.

## 2014-06-05 NOTE — Assessment & Plan Note (Signed)
Report of mood is euthymic.  Affect is consistent.  Thoughts are clear and goal directed.  Given her long standing questions about mind / body connection for her symptoms, discussed whether yoga might be a good experiment for her.  I asked her to consider how long would be an adequate trial to determine whether it was having a positive effect.  She would also have to determine how many days per week.  She seemed open to this idea especially since more cardio based activities are a challenge when she is having these symptoms.  She will keep me posted as to her job search.  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  She will call / email to schedule as needed.

## 2014-06-08 ENCOUNTER — Other Ambulatory Visit: Payer: Self-pay | Admitting: Family Medicine

## 2014-06-08 ENCOUNTER — Encounter: Payer: Self-pay | Admitting: Family Medicine

## 2014-06-08 DIAGNOSIS — R292 Abnormal reflex: Secondary | ICD-10-CM

## 2014-06-08 DIAGNOSIS — R5382 Chronic fatigue, unspecified: Secondary | ICD-10-CM

## 2014-06-08 DIAGNOSIS — F4323 Adjustment disorder with mixed anxiety and depressed mood: Secondary | ICD-10-CM

## 2014-06-08 DIAGNOSIS — H5310 Unspecified subjective visual disturbances: Secondary | ICD-10-CM

## 2014-06-08 DIAGNOSIS — R202 Paresthesia of skin: Secondary | ICD-10-CM

## 2014-06-08 DIAGNOSIS — R61 Generalized hyperhidrosis: Secondary | ICD-10-CM

## 2014-06-08 LAB — CBC WITH DIFFERENTIAL/PLATELET
BASOS ABS: 0.1 10*3/uL (ref 0.0–0.1)
Basophils Relative: 1 % (ref 0–1)
Eosinophils Absolute: 0.1 10*3/uL (ref 0.0–0.7)
Eosinophils Relative: 2 % (ref 0–5)
HEMATOCRIT: 39.2 % (ref 36.0–46.0)
Hemoglobin: 13.5 g/dL (ref 12.0–15.0)
LYMPHS ABS: 2.7 10*3/uL (ref 0.7–4.0)
Lymphocytes Relative: 51 % — ABNORMAL HIGH (ref 12–46)
MCH: 32.4 pg (ref 26.0–34.0)
MCHC: 34.4 g/dL (ref 30.0–36.0)
MCV: 94 fL (ref 78.0–100.0)
MONOS PCT: 9 % (ref 3–12)
MPV: 10.7 fL (ref 8.6–12.4)
Monocytes Absolute: 0.5 10*3/uL (ref 0.1–1.0)
NEUTROS ABS: 1.9 10*3/uL (ref 1.7–7.7)
Neutrophils Relative %: 37 % — ABNORMAL LOW (ref 43–77)
Platelets: 268 10*3/uL (ref 150–400)
RBC: 4.17 MIL/uL (ref 3.87–5.11)
RDW: 12.8 % (ref 11.5–15.5)
WBC: 5.2 10*3/uL (ref 4.0–10.5)

## 2014-06-08 LAB — COMPREHENSIVE METABOLIC PANEL
ALBUMIN: 4.3 g/dL (ref 3.5–5.2)
ALK PHOS: 30 U/L — AB (ref 39–117)
ALT: 12 U/L (ref 0–35)
AST: 16 U/L (ref 0–37)
BILIRUBIN TOTAL: 0.8 mg/dL (ref 0.2–1.2)
BUN: 9 mg/dL (ref 6–23)
CO2: 23 mEq/L (ref 19–32)
Calcium: 9.4 mg/dL (ref 8.4–10.5)
Chloride: 106 mEq/L (ref 96–112)
Creat: 0.9 mg/dL (ref 0.50–1.10)
GLUCOSE: 87 mg/dL (ref 70–99)
Potassium: 4.4 mEq/L (ref 3.5–5.3)
SODIUM: 139 meq/L (ref 135–145)
Total Protein: 6.9 g/dL (ref 6.0–8.3)

## 2014-06-08 NOTE — Progress Notes (Signed)
Subjective:    Samantha NissenStacey A Norman is a 29 y.o. female who presents to [facility] today for several issues:  1.  Back pain:  Ongoing.  Also with associated leg paresthesias at time.  Desribes back pain as dull aching, worse with prolonged standing or prolonged sitting.  No LE weakness.  Paresthesias have inconsistent triggers.  No recent injuries.    Not doing much if at all for exercise.  Does have night sweats without changes in weight or fevers.  Night sweats have been ongoing for weeks. No cough.  2.  Tremors:  Ongoing for weeks to months.  Intermittent.  States these are sometimes bad enough to the point she has trouble holding a folk.  Very inconsistent symptoms, but does appear to be intention tremor.    3.  Vision changes:  Also intermittent.  Has continued to follow up with her eye doctor.  Has not had any retinal issues or floaters for last several weeks.  Does occasionally still have halos and brief "flashes of light" but these are not sustained.   Prev health:  Needs pap smear.  Has upcoming appt with Gyn in next 2 weeks.   Interestingly, her dog was diagnosed with lyme disease about a year ago by blood testing.  Had been weak and not eating well at home.  Improved after treatment.  Samantha StanleyStacey reports tick in her own nguinal crease about 1 year ago, unknown how long it was there.  Cannot remember how large it was. Many of her symptoms predated the tick.    ROS as above per HPI, otherwise neg.   The following portions of the patient's history were reviewed and updated as appropriate: allergies, current medications, past medical history, family and social history, and problem list. Patient is a nonsmoker.    PMH reviewed.  Past Medical History  Diagnosis Date  . Anxiety and depression   . Dizziness   . Hx of migraine headaches   . Fracture of lumbar spine     Hx. of fracture   Past Surgical History  Procedure Laterality Date  . None      Medications reviewed. Current Outpatient  Prescriptions  Medication Sig Dispense Refill  . norethindrone-ethinyl estradiol 1/35 (NECON 1/35, 28,) tablet Take 1 tablet by mouth daily. 3 Package 1  . propranolol (INDERAL) 20 MG tablet Take 1 tablet (20 mg total) by mouth 3 (three) times daily. 90 tablet 6  . venlafaxine XR (EFFEXOR XR) 75 MG 24 hr capsule Take 1 capsule (75 mg total) by mouth daily with breakfast. 90 capsule 6   No current facility-administered medications for this visit.     Objective:   Physical Exam Filed Vitals:   06/08/14 1510  BP: 115/67  Pulse: 87  Resp: 16  Orthostatic vitals:  Lying 119/70  P65   Sitting:  116/70  P70   Standing 109/69  P72 Gen:  Alert, cooperative patient who appears stated age in no acute distress.  Vital signs reviewed. HEENT: EOMI,  MMM Neck:  No thyromegaly or nodules.  No LAD Cardiac:  Regular rate and rhythm without murmur auscultated.  Good S1/S2. Pulm:  Clear to auscultation bilaterally with good air movement.  No wheezes or rales noted.   Exts: Non edematous BL LE's Neuro:  Alert and oriented.  +3 reflexes patellar and ankle.  8-10 beats of clonus BL feet.  Triceps and brachioradialis reflexes are normal.  Strength and sensation are 5/5 throughout    No results found for this  or any previous visit (from the past 72 hour(s)).

## 2014-06-08 NOTE — Patient Instructions (Signed)
I want you to record your temperatures when you wake up at night sweaty.  I want you to keep a diary of your symptoms --- both night sweats and other symptoms.

## 2014-06-09 LAB — VITAMIN D 25 HYDROXY (VIT D DEFICIENCY, FRACTURES): VIT D 25 HYDROXY: 44 ng/mL (ref 30–100)

## 2014-06-09 LAB — TSH: TSH: 1.725 u[IU]/mL (ref 0.350–4.500)

## 2014-06-11 ENCOUNTER — Ambulatory Visit
Admission: RE | Admit: 2014-06-11 | Discharge: 2014-06-11 | Disposition: A | Discharge status: Home / Self Care | Payer: 59 | Source: Ambulatory Visit | Attending: Family Medicine | Admitting: Family Medicine

## 2014-06-11 DIAGNOSIS — R292 Abnormal reflex: Secondary | ICD-10-CM

## 2014-06-11 DIAGNOSIS — R61 Generalized hyperhidrosis: Secondary | ICD-10-CM | POA: Insufficient documentation

## 2014-06-11 LAB — B. BURGDORFI ANTIBODIES: B burgdorferi Ab IgG+IgM: 0.39 {ISR}

## 2014-06-11 NOTE — Assessment & Plan Note (Signed)
Ongoing issue.   Think this is most likely related to use of her anti-depressant: as many as 15% of people can have night sweats with SNRI usage.  Didn't start until she began effexor.  No signs of TB.  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   Chemistry and lyme testing as above.

## 2014-06-11 NOTE — Assessment & Plan Note (Signed)
Continue effexor and meeting with psychologist.   If night sweats persist to the point of affecting her, can talk about switching but she is well controlled on effexor currently.

## 2014-06-11 NOTE — Assessment & Plan Note (Signed)
Ongoing. With known hyperreflexia.   Has known lumbar fracture in past.  Does NOT have upper extremity increased DTRs.  Makes back somewhat more plausible as cause of her paresthesias. Obtaining lumbar films today.  May eventually need MRI, but would like to hold off on this for now.

## 2014-06-11 NOTE — Assessment & Plan Note (Signed)
Overall this is better

## 2014-06-11 NOTE — Assessment & Plan Note (Addendum)
Better overall but still intermittent.   She is still not doing much for physical activity.  Also with poor eating habits (long periods between meals, predominantly non-nutritious foods).   Has had negative lyme titer in past.  With known tick exposure.  Dog with lyme disease and symptoms roughly same time she had tick exposures.   Pros and cons of lyme testing discussed.  She would like to proceed with this.    Overall, we discussed that she needs to keep a journal of her daily activity, night sweats, and the tremors she is having.  She is also to take her temperature when she wakes up with night sweats.    FU in ~1 month.

## 2014-06-18 ENCOUNTER — Ambulatory Visit (INDEPENDENT_AMBULATORY_CARE_PROVIDER_SITE_OTHER): Payer: 59 | Admitting: Obstetrics & Gynecology

## 2014-06-18 ENCOUNTER — Encounter: Payer: Self-pay | Admitting: Obstetrics & Gynecology

## 2014-06-18 VITALS — BP 110/84 | HR 61 | Ht 68.5 in | Wt 145.1 lb

## 2014-06-18 DIAGNOSIS — Z124 Encounter for screening for malignant neoplasm of cervix: Secondary | ICD-10-CM

## 2014-06-18 DIAGNOSIS — Z01419 Encounter for gynecological examination (general) (routine) without abnormal findings: Secondary | ICD-10-CM

## 2014-06-18 MED ORDER — NORETHINDRONE-ETH ESTRADIOL 1-35 MG-MCG PO TABS: 1.0000 | ORAL_TABLET | Freq: Every day | ORAL | Status: DC

## 2014-06-18 NOTE — Patient Instructions (Signed)
Oral Contraception Use Oral contraceptive pills (OCPs) are medicines taken to prevent pregnancy. OCPs work by preventing the ovaries from releasing eggs. The hormones in OCPs also cause the cervical mucus to thicken, preventing the sperm from entering the uterus. The hormones also cause the uterine lining to become thin, not allowing a fertilized egg to attach to the inside of the uterus. OCPs are highly effective when taken exactly as prescribed. However, OCPs do not prevent sexually transmitted diseases (STDs). Safe sex practices, such as using condoms along with an OCP, can help prevent STDs. Before taking OCPs, you may have a physical exam and Pap test. Your health care provider may also order blood tests if necessary. Your health care provider will make sure you are a good candidate for oral contraception. Discuss with your health care provider the possible side effects of the OCP you may be prescribed. When starting an OCP, it can take 2 to 3 months for the body to adjust to the changes in hormone levels in your body.  HOW TO TAKE ORAL CONTRACEPTIVE PILLS Your health care provider may advise you on how to start taking the first cycle of OCPs. Otherwise, you can:   Start on day 1 of your menstrual period. You will not need any backup contraceptive protection with this start time.   Start on the first Sunday after your menstrual period or the day you get your prescription. In these cases, you will need to use backup contraceptive protection for the first week.   Start the pill at any time of your cycle. If you take the pill within 5 days of the start of your period, you are protected against pregnancy right away. In this case, you will not need a backup form of birth control. If you start at any other time of your menstrual cycle, you will need to use another form of birth control for 7 days. If your OCP is the type called a minipill, it will protect you from pregnancy after taking it for 2 days (48  hours). After you have started taking OCPs:   If you forget to take 1 pill, take it as soon as you remember. Take the next pill at the regular time.   If you miss 2 or more pills, call your health care provider because different pills have different instructions for missed doses. Use backup birth control until your next menstrual period starts.   If you use a 28-day pack that contains inactive pills and you miss 1 of the last 7 pills (pills with no hormones), it will not matter. Throw away the rest of the non-hormone pills and start a new pill pack.  No matter which day you start the OCP, you will always start a new pack on that same day of the week. Have an extra pack of OCPs and a backup contraceptive method available in case you miss some pills or lose your OCP pack.  HOME CARE INSTRUCTIONS   Do not smoke.   Always use a condom to protect against STDs. OCPs do not protect against STDs.   Use a calendar to mark your menstrual period days.   Read the information and directions that came with your OCP. Talk to your health care provider if you have questions.  SEEK MEDICAL CARE IF:   You develop nausea and vomiting.   You have abnormal vaginal discharge or bleeding.   You develop a rash.   You miss your menstrual period.   You are losing   your hair.   You need treatment for mood swings or depression.   You get dizzy when taking the OCP.   You develop acne from taking the OCP.   You become pregnant.  SEEK IMMEDIATE MEDICAL CARE IF:   You develop chest pain.   You develop shortness of breath.   You have an uncontrolled or severe headache.   You develop numbness or slurred speech.   You develop visual problems.   You develop pain, redness, and swelling in the legs.  Document Released: 03/05/2011 Document Revised: 07/31/2013 Document Reviewed: 09/04/2012 Exit[employer] Patient Information 2015 [employer]. This information is not intended to replace  advice given to you by your health care provider. Make sure you discuss any questions you have with your health care provider.  

## 2014-06-18 NOTE — Progress Notes (Signed)
Patient ID: Samantha Norman, female   DOB: 05/17/1985, 29 y.o.   MRN: [id number] Subjective:     Samantha Norman is a 29 y.o. female here for a routine exam.  Current complaints: none.  Pt reports amenorrhea every other month on her pills which has occurred for several years.  Her cycles used to be very heavy so this does not bother her.  She is not sexually active.    Pt mother was treated for breast cancer at age 72 years of age.  She denies h/o STI and does not report the need for cx today.   Gynecologic History Patient's last menstrual period was 04/02/2014 (exact date). Contraception: OCP (estrogen/progesterone) Last Pap: 2013. Results were: normal  Family History  Problem Relation Age of Onset  . Hypertension Mother   . Breast cancer Mother   . Alzheimer's disease Maternal Uncle   . Cancer - Prostate Paternal Uncle   . Cancer - Prostate Paternal Grandfather   . Stroke Father    Obstetric History OB History  Gravida Para Term Preterm AB SAB TAB Ectopic Multiple Living        Past Medical History  Diagnosis Date  . Anxiety and depression   . Dizziness   . Hx of migraine headaches   . Fracture of lumbar spine     Hx. of fracture   Past Surgical History  Procedure Laterality Date  . None     Current Outpatient Prescriptions on File Prior to Visit  Medication Sig Dispense Refill  . norethindrone-ethinyl estradiol 1/35 (NECON 1/35, 28,) tablet Take 1 tablet by mouth daily. 3 Package 1  . propranolol (INDERAL) 20 MG tablet Take 1 tablet (20 mg total) by mouth 3 (three) times daily. 90 tablet 6  . venlafaxine XR (EFFEXOR XR) 75 MG 24 hr capsule Take 1 capsule (75 mg total) by mouth daily with breakfast. 90 capsule 6   No current facility-administered medications on file prior to visit.     The following portions of the patient's history were reviewed and updated as appropriate: allergies, current medications, past family history, past medical history, past  social history, past surgical history and problem list.  Review of Systems A comprehensive review of systems was negative.    Objective:    BP 110/84 mmHg  Pulse 61  Ht 5' 8.5" (1.74 m)  Wt 145 lb 1.6 oz (65.817 kg)  BMI 21.74 kg/m2  LMP 04/02/2014 (Exact Date)  General Appearance:    Alert, cooperative, no distress, appears stated age  Head:    Normocephalic, without obvious abnormality, atraumatic              Neck:   Supple, symmetrical, trachea midline, no adenopathy;    thyroid:  no enlargement/tenderness/nodules; no carotid   bruit or JVD  Back:     Symmetric, no curvature, ROM normal, no CVA tenderness  Lungs:     Clear to auscultation bilaterally, respirations unlabored  Chest Wall:    No tenderness or deformity   Heart:    Regular rate and rhythm, S1 and S2 normal, no murmur, rub   or gallop  Breast Exam:    No tenderness, masses, or nipple abnormality  Abdomen:     Soft, non-tender, bowel sounds active all four quadrants,    no masses, no organomegaly  Genitalia:    Normal female without lesion, discharge or tenderness GU: EGBUS: no lesions Vagina: no blood in  vault Cervix: no lesion; no mucopurulent d/c Uterus: small, mobile Adnexa: no masses; nontender     Extremities:   Extremities normal, atraumatic, no cyanosis or edema  Pulses:   2+ and symmetric all extremities  Skin:   Skin color, texture, turgor normal, no rashes or lesions            Assessment:    Healthy female exam.   Pill amenorrhea- content with bleeding pattern.  Will refill OCPs   Plan:    Follow up in: 1 year.    Keep Necon for OCPs.

## 2014-06-19 ENCOUNTER — Ambulatory Visit (INDEPENDENT_AMBULATORY_CARE_PROVIDER_SITE_OTHER): Payer: 59 | Admitting: Family Medicine

## 2014-06-19 VITALS — BP 115/67 | HR 87 | Resp 16

## 2014-06-20 LAB — CYTOLOGY - PAP

## 2014-08-20 ENCOUNTER — Ambulatory Visit (INDEPENDENT_AMBULATORY_CARE_PROVIDER_SITE_OTHER): Payer: 59 | Admitting: Psychology

## 2014-08-20 DIAGNOSIS — F4323 Adjustment disorder with mixed anxiety and depressed mood: Secondary | ICD-10-CM

## 2014-08-20 NOTE — Assessment & Plan Note (Signed)
Mood euthymic although she reports a little more down recently.  Affect is within normal limits.  The theme for what she wants to discuss today is boundaries.  She has a good idea of how to handle the situation with her mother and I think she has a reasonable plan regarding the job situation.  The big one is with a co-worker who behaves in a way that is uncomfortable for Samantha Norman.  We have talked about this previously and she was planning to set a limit but has not followed through.  We explored the advantages and disadvantages and discussed the barriers.  I think she determined that she would regret it if she didn't and while it is easier to think about paying it forward for those that come behind her, the bottom line is that she needs to do this for herself.  Looked at a deadline so she doesn't "marinade" in her anxiety.  She will work with this person tomorrow and plans on doing it then.  She understands that she will not "want" to do it, that this is hard, and she can do hard things.  She can follow up with me via phone or email.  She will email me later to schedule an appointment as needed.

## 2014-08-20 NOTE — Psych (Signed)
Samantha StanleyStacey presents for follow-up.  She has several things to discuss including some interactions with her mother, the job situation, and a difficulty with a co-worker.

## 2014-08-21 ENCOUNTER — Encounter: Payer: Self-pay | Admitting: Nurse Practitioner

## 2014-08-21 ENCOUNTER — Ambulatory Visit (INDEPENDENT_AMBULATORY_CARE_PROVIDER_SITE_OTHER): Payer: 59 | Admitting: Nurse Practitioner

## 2014-08-21 VITALS — BP 111/79 | HR 72 | Ht 69.0 in | Wt 146.0 lb

## 2014-08-21 DIAGNOSIS — H5310 Unspecified subjective visual disturbances: Secondary | ICD-10-CM

## 2014-08-21 DIAGNOSIS — G441 Vascular headache, not elsewhere classified: Secondary | ICD-10-CM | POA: Diagnosis not present

## 2014-08-21 MED ORDER — PROPRANOLOL HCL ER 60 MG PO CP24: 60.0000 mg | ORAL_CAPSULE | Freq: Every day | ORAL | Status: DC

## 2014-08-21 NOTE — Patient Instructions (Signed)
Continue propranolol at current dose will refill 3 months with 2 refills Exercise for overall health and well-being Call for any increase in headaches Follow-up in 6-8 months

## 2014-08-21 NOTE — Progress Notes (Signed)
I have read the note, and I agree with the clinical assessment and plan.  Brinlynn Gorton KEITH   

## 2014-08-21 NOTE — Progress Notes (Signed)
I have read the note, and I agree with the clinical assessment and plan.  Samantha Norman,Samantha Norman   

## 2014-08-21 NOTE — Progress Notes (Signed)
[facility]  PATIENT: Samantha Norman DOB: 03-Aug-1985   REASON FOR VISIT: follow up for headache HISTORY FROM: Patient    HISTORY OF PRESENT ILLNESS:Ms. Samantha Norman is a 29 year old left-handed white female with a history of headaches, and subjective visual disturbances. She was last seen in this office by Dr. Anne Hahn 02/20/2014. The patient has had an extensive workup that has included MRI of the brain, cervical spine, and thoracic spine. The patient has no evidence of demyelinating disease. A visual evoked response test was unremarkable. The patient has seen an ophthalmologist who did not see anything abnormal within the eyes. The patient has been placed on propranolol,  ER  daily. This has helped the headache some, she continues to have about one headache per week.  She returns for an evaluation.   REVIEW OF SYSTEMS: Full 14 system review of systems performed and notable only for those listed, all others are neg:  Constitutional: Fatigue Cardiovascular: Palpitations Ear/Nose/Throat: neg  Skin: neg Eyes: Blurred vision, light sensitivity Respiratory: neg Gastroitestinal: neg  Hematology/Lymphatic: neg  Endocrine: Flushing Musculoskeletal: Joint pain Allergy/Immunology: neg Neurological: Headache, tremors Psychiatric: neg Sleep : neg   ALLERGIES: No Known Allergies  HOME MEDICATIONS: Outpatient Prescriptions Prior to Visit  Medication Sig Dispense Refill  . norethindrone-ethinyl estradiol 1/35 (NECON 1/35, 28,) tablet Take 1 tablet by mouth daily. 3 Package 3  . venlafaxine XR (EFFEXOR XR) 75 MG 24 hr capsule Take 1 capsule (75 mg total) by mouth daily with breakfast. 90 capsule 6  . propranolol (INDERAL) 20 MG tablet Take 1 tablet (20 mg total) by mouth 3 (three) times daily. 90 tablet 6   No facility-administered medications prior to visit.    PAST MEDICAL HISTORY: Past Medical History  Diagnosis Date  . Anxiety and depression   . Dizziness   . Hx of  migraine headaches   . Fracture of lumbar spine     Hx. of fracture    PAST SURGICAL HISTORY: Past Surgical History  Procedure Laterality Date  . None      FAMILY HISTORY: Family History  Problem Relation Age of Onset  . Hypertension Mother   . Breast cancer Mother   . Alzheimer's disease Maternal Uncle   . Cancer - Prostate Paternal Uncle   . Cancer - Prostate Paternal Grandfather   . Stroke Father     SOCIAL HISTORY: History   Social History  . Marital Status: Single    Spouse Name: N/A  . Number of Children: 0  . Years of Education: Pharmacy   Occupational History  . Student    Social History Main Topics  . Smoking status: Never Smoker   . Smokeless tobacco: Never Used  . Alcohol Use: 0.0 oz/week    0 Standard drinks or equivalent per week     Comment: Consumes alcohol on occasion  . Drug Use: No  . Sexual Activity: Yes    Birth Control/ Protection: Pill   Other Topics Concern  . Not on file   Social History Narrative     PHYSICAL EXAM  Filed Vitals:   08/21/14 0829  BP: 111/79  Pulse: 72  Height:  (1.753 m)  Weight: 146 lb (66.225 kg)   Body mass index is 21.55 kg/(m^2).  Generalized: Well developed, in no acute distress  Head: normocephalic and atraumatic,. Oropharynx benign  Neck: Supple, full range of motion Musculoskeletal: No deformity   Neurological examination  Mentation: Alert oriented to time, place, history taking. Attention span and  concentration appropriate. Recent and remote memory intact.  Follows all commands speech and language fluent.  Cranial nerve II-XII: Visual acuity 20/30 right, 20/40 left Pupils were equal round reactive to light extraocular movements were full, visual field were full on confrontational test. Facial sensation and strength were normal. hearing was intact to finger rubbing bilaterally. Uvula tongue midline. head turning and shoulder shrug were normal and symmetric.Tongue protrusion into cheek strength  was normal. Motor: normal bulk and tone, full strength in the BUE, BLE, fine finger movements normal, no pronator drift. No focal weakness Coordination: finger-nose-finger, heel-to-shin bilaterally, no dysmetria Reflexes: Brachioradialis 2/2, biceps 2/2, triceps 2/2, patellar 2/2, Achilles 2/2, plantar responses were flexor bilaterally. Gait and Station: Rising up from seated position without assistance, normal stance,  moderate stride, good arm swing, smooth turning, able to perform tiptoe, and heel walking without difficulty. Tandem gait is steady  DIAGNOSTIC DATA (LABS, IMAGING, TESTING) - I reviewed patient records, labs, notes, testing and imaging myself where available.  Lab Results  Component Value Date   WBC 5.2 06/08/2014   HGB 13.5 06/08/2014   HCT 39.2 06/08/2014   MCV 94.0 06/08/2014   PLT 268 06/08/2014      Component Value Date/Time   NA 139 06/08/2014 1509   K 4.4 06/08/2014 1509   CL 106 06/08/2014 1509   CO2 23 06/08/2014 1509   GLUCOSE 87 06/08/2014 1509   BUN 9 06/08/2014 1509   CREATININE 0.90 06/08/2014 1509   CALCIUM 9.4 06/08/2014 1509   PROT 6.9 06/08/2014 1509   ALBUMIN 4.3 06/08/2014 1509   AST 16 06/08/2014 1509   ALT 12 06/08/2014 1509   ALKPHOS 30* 06/08/2014 1509   BILITOT 0.8 06/08/2014 1509    Lab Results  Component Value Date   TSH 1.725 06/08/2014      ASSESSMENT AND PLAN  29 y.o. year old female  has a past medical history of Anxiety and depression; Dizziness; migraine headaches; and subjective visual disturbance here to  follow-up.  Continue propranolol at current dose will refill 3 months with 2 refills Exercise for overall health and well-being Given a list of migraine triggers and went over those in terms of foods, environmental, psychological, etc. Call for any increase in headaches Follow-up in 6-8 months Nilda RiggsNancy Carolyn Falisa Norman, [facility] Care HospitalGNP, [facility] HospitalBC, APRN  [facility] [address], [address] [place], [place]  [id number] [phone]

## 2014-09-11 ENCOUNTER — Ambulatory Visit (INDEPENDENT_AMBULATORY_CARE_PROVIDER_SITE_OTHER): Payer: 59 | Admitting: Psychology

## 2014-09-11 DIAGNOSIS — F4323 Adjustment disorder with mixed anxiety and depressed mood: Secondary | ICD-10-CM | POA: Diagnosis not present

## 2014-09-11 NOTE — Progress Notes (Signed)
Samantha Norman presents for follow-up.  She was having an especially tough week last week as she continues to navigate the job landscape.  We touched base and she was able to develop a plan for the weekend.  Saw her today in follow-up and she reports that she is doing much better.  Sleep is reported as being one of the major things that helped.  She got a lot of rest this past weekend and is feeling better in general.  Discussed how her job search is impacting her and how best to manage it going forward.

## 2014-09-11 NOTE — Assessment & Plan Note (Signed)
Downturn in mood for about 3 days that included difficulty with sleep, eating, and tearfulness.  Was able to manage it through support of colleagues, rest, and mild structure for the weekend.  Mood today reported as euthymic.  She questions about letting things "go too far" before deciding to schedule an appointment.  She notes warning signs like withdrawing from friends, sleep disruption.  Also discussed that sometimes, mood does dip down and there are certain things she can do for self-care until it starts to shift back again.  This was three days which is not unreasonable given the transition period she is in.  Will follow in two weeks.

## 2014-09-25 ENCOUNTER — Ambulatory Visit (INDEPENDENT_AMBULATORY_CARE_PROVIDER_SITE_OTHER): Payer: 59 | Admitting: Psychology

## 2014-09-25 DIAGNOSIS — F4323 Adjustment disorder with mixed anxiety and depressed mood: Secondary | ICD-10-CM | POA: Diagnosis not present

## 2014-09-25 NOTE — Progress Notes (Signed)
Samantha StanleyStacey presented for follow-up.  She had a tough week last week having thought she had figured out a job situation that was a great fit for her and then learning that it would not happen after all.  She reported a significantly depressed mood for several days coupled with difficulty eating and sleeping.  She was unable to access the people she wanted to for support.  She reported a positive shift in mood when she learned that her relief status position would allow her to get health insurance.  Also, her mood shifted from sadness to anger as she continued to process things.  Discussed the situation, her coping, and next steps.

## 2014-09-25 NOTE — Assessment & Plan Note (Signed)
She reports feeling less depressed and significantly more anxious today.  Affect appears within normal limits.  She does have some hives developing and she is discussing her situation.  Thoughts are clear and goal directed and do not appear catastrophic (with the possible exception of her job situation).  Discussed possible next steps, both interpersonally and for her job, and came up with a plan that seems reasonable.  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  Eventually this will work out in a way that feels more comfortable AND her current job landscape is not terrible.  She will need to continue to review these facts to help manage her anxiety.    Will follow next week or as needed.

## 2014-10-02 ENCOUNTER — Ambulatory Visit (INDEPENDENT_AMBULATORY_CARE_PROVIDER_SITE_OTHER): Payer: 59 | Admitting: Psychology

## 2014-10-02 DIAGNOSIS — F4323 Adjustment disorder with mixed anxiety and depressed mood: Secondary | ICD-10-CM | POA: Diagnosis not present

## 2014-10-02 NOTE — Assessment & Plan Note (Signed)
Mood with associated symptoms seems improved.  She does have variability in emotions that seem consistent with the situation (frustration, sadness, anxiety).  She appears to be exercising control over the things she can and based on her account, does not appear to be behaving in ways that might sabotage her work prospects (e.g. Overstepping bounds).  She has hope for the future.  With regards to dating, she determined that a romantic relationship is not a priority right now but building friendships is.  Additionally, online dating tends to drain her rather than energize.  She decided it is not a good match for her right now.  She will email to schedule as needed.  She has coping in place and appears to be stable.

## 2014-10-02 NOTE — Progress Notes (Signed)
Samantha StanleyStacey presented for follow-up.  She was doing better for a time but has been up and down as she considers and reconsiders the job landscape.  Checked in on family stuff.  She is managing this okay.  She went on a date recently and wondered about how much she should do with online dating given her stress level.

## 2014-10-18 ENCOUNTER — Ambulatory Visit (INDEPENDENT_AMBULATORY_CARE_PROVIDER_SITE_OTHER): Payer: 59 | Admitting: Psychology

## 2014-10-18 DIAGNOSIS — F4323 Adjustment disorder with mixed anxiety and depressed mood: Secondary | ICD-10-CM | POA: Diagnosis not present

## 2014-10-18 NOTE — Progress Notes (Signed)
Samantha Norman presents for follow-up.  She has been hearing stuff on the job front a lot lately and is have a continued difficulty time managing it.  Talking it out loud helps her get some clarity.  Her physical symptoms have returned - dizziness and nausea / vomiting being the most predominant.  She wonders whether it might be viral or stress related.

## 2014-10-18 NOTE — Assessment & Plan Note (Signed)
She seems to have a pretty clear idea of her current path.  She understands it comes with stress and a longer waiting game but seems, again, to have weighed the pros and cons and is making thoughtful decisions.  Discussed the possibility that her symptoms may be stress related.   It is possible that she has a sensitive body and that this is one of the ways she responds to stress.  It is also possible that something else is causing these symptoms.  Going with the stress connection, we looked at tolerating the symptoms (which she seems to do reasonably well), acceptance that this might be how her body is wired (hard because there aren't any definitive answers), and stress reduction.  Regarding the latter, she names several things.  Again - I encouraged activities that are social and physical (tennis, yoga).  She is doing diaphragmatic breathing with success.  Will follow as needed.

## 2014-12-14 ENCOUNTER — Ambulatory Visit (INDEPENDENT_AMBULATORY_CARE_PROVIDER_SITE_OTHER): Payer: 59 | Admitting: Psychology

## 2014-12-14 DIAGNOSIS — F4323 Adjustment disorder with mixed anxiety and depressed mood: Secondary | ICD-10-CM | POA: Diagnosis not present

## 2014-12-14 NOTE — Progress Notes (Signed)
Samantha Norman presents for follow-up.  She continues to experience an ever-changing landscape when it comes to her professional life and likes to check-in occasionally to help ensure her wellness.  She reports that overall, she has been managing well with the exception of a big "meltdown" where she cried for about 4 hours.  She called a few people including her mother.  Her mother ended up being helpful.  This was good in the short-term but engaging and being vulnerable with her mother is a bit of a risk based on Samantha Norman's experiences.  Discussed work options.  She is doing well symptom wise.  Most bothersome thing is sleep.  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  She thinks she averages 5-6 hours a night.  Will often compensate on the weekend further messing up her sleep schedule (per her report).  Has a date tonight.  Lower on the priority list than the job issue.  Feels okay about this.

## 2014-12-14 NOTE — Assessment & Plan Note (Signed)
Report of mood is good.  Affect is consistent.  Thoughts are clear and goal directed.  She seems to have adjusted somewhat to the uncertainty but does name this as the most bothersome thing.  She is aware of sleep hygiene and training her body to her bed.  She thinks she needs to do this.  Did not review as she knows what to do.  Could have gotten specific around a plan for this but ran out of time.  Will follow as needed.  Taking medication regularly with good effect.

## 2015-01-18 ENCOUNTER — Ambulatory Visit (INDEPENDENT_AMBULATORY_CARE_PROVIDER_SITE_OTHER): Payer: 59 | Admitting: Psychology

## 2015-01-18 DIAGNOSIS — F4323 Adjustment disorder with mixed anxiety and depressed mood: Secondary | ICD-10-CM | POA: Diagnosis not present

## 2015-01-18 NOTE — Assessment & Plan Note (Signed)
Discussed pros and cons around job situation.  She seems to have a pretty good sense of what she wants and the limits she will need to enforce if it doesn't come through.  Regarding the relationship, touched on avoidance as a way to manage anxiety.  This touches relationship in a number of different ways and she seems to have a good awareness of it.  Overall, despite continued stressors, she seems like she is in a good place.  I think talking about some of this relationship stuff is a good way to combat avoidance (in a safe place).  She mentioned getting in to see me more frequently which I think is a good idea as she navigates both of these stressful issues.

## 2015-01-18 NOTE — Progress Notes (Signed)
Misty StanleyStacey presented for follow-up.  Two main points to discuss include on-going job stuff and a new romantic relationship.

## 2015-02-01 ENCOUNTER — Ambulatory Visit: Payer: 59 | Admitting: Family Medicine

## 2015-02-04 ENCOUNTER — Encounter: Payer: Self-pay | Admitting: Family Medicine

## 2015-02-04 ENCOUNTER — Ambulatory Visit (INDEPENDENT_AMBULATORY_CARE_PROVIDER_SITE_OTHER): Payer: 59 | Admitting: Family Medicine

## 2015-02-04 VITALS — BP 100/70 | HR 72 | Temp 98.3°F | Ht 68.5 in | Wt 149.0 lb

## 2015-02-04 DIAGNOSIS — F4323 Adjustment disorder with mixed anxiety and depressed mood: Secondary | ICD-10-CM | POA: Diagnosis not present

## 2015-02-04 DIAGNOSIS — R002 Palpitations: Secondary | ICD-10-CM | POA: Diagnosis not present

## 2015-02-04 DIAGNOSIS — R5381 Other malaise: Secondary | ICD-10-CM | POA: Diagnosis not present

## 2015-02-04 DIAGNOSIS — G43009 Migraine without aura, not intractable, without status migrainosus: Secondary | ICD-10-CM

## 2015-02-04 DIAGNOSIS — T783XXS Angioneurotic edema, sequela: Secondary | ICD-10-CM | POA: Diagnosis not present

## 2015-02-04 DIAGNOSIS — Z13220 Encounter for screening for lipoid disorders: Secondary | ICD-10-CM

## 2015-02-04 DIAGNOSIS — R5383 Other fatigue: Secondary | ICD-10-CM

## 2015-02-04 MED ORDER — VENLAFAXINE HCL ER 75 MG PO CP24: 75.0000 mg | ORAL_CAPSULE | Freq: Every day | ORAL | Status: DC

## 2015-02-04 NOTE — Progress Notes (Signed)
Pre visit review using our clinic review tool, if applicable. No additional management support is needed unless otherwise documented below in the visit note. 

## 2015-02-04 NOTE — Patient Instructions (Addendum)
EKG today Return at your convenience for fasting labs We will call you or send you letter with results Nice to meet you! Call us with questions Return as needed.

## 2015-02-04 NOTE — Progress Notes (Signed)
BP 100/70 mmHg  Pulse 72  Temp(Src) 98.3 F (36.8 C) (Oral)  Ht 5' 8.5" (1.74 m)  Wt 149 lb (67.586 kg)  BMI 22.32 kg/m2  LMP 08/08/2014   CC: new pt to establish  Subjective:    Patient ID: Samantha Norman, female    DOB: March 09, 1986, 29 y.o.   MRN: [id number]  HPI: Samantha Norman is a 29 y.o. female presenting on 02/04/2015 for Establish Care   Prior seen at [facility]. 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 Work stress - hopes to stay on full time position at one of three areas. Moved from [place].   Malaise over last 5 days with diarrhea, nausea, headaches, dizziness and chest pain described as dull ache that is positional - worse with coughing or laughing and deep breathing. Not reproducible with palpation. Some palpitations described as fluttering. No coughing, wheezing. Fingers do get cold and pale occasionally. No pain, no blue. Joints in hands occasionally also get painful and warm but not swollen or red. Knees ache.   3 yr h/o episodes of malaise associated with headaches, fatigue, paresthesias of both legs, dizziness, vision problems, loss of balance with weakness. Has seen ophthalmologist with unrevealing etiology. Has seen neurologist, saw rheumatologist but never followed up. Has seen cardiologist at [facility] for ?arrhythmia - PVCs found (2014). ?stress related. Father with h/o CVA, other family members with CVA as well. Stopped running - passed out x1, attributed to propranolol.   ?vascular migraines - followed by neurology and managed on propranolol. Has f/u scheduled 02/2015 with neurologist.  Chronic back pain with intermittent paresthesias. H/o L5 lumbar fracture in the past. Injury playing soccer at age 38 yo.   H/o angioedema unclear cause - carries epi pen.   Depression - controlled on effexor XR 27m daily. Sees Dr MZella Ball   Preventative: Well woman with OBGYN Dr HIhor Dow5/2016, on  OCP, period only once a year Flu shot yearly Td in [place] ~2010  Relevant past medical, surgical, family and social history reviewed and updated as indicated. Interim medical history since our last visit reviewed. Allergies and medications reviewed and updated. Current Outpatient Prescriptions on File Prior to Visit  Medication Sig  . norethindrone-ethinyl estradiol 1/35 (NECON 1/35, 28,) tablet Take 1 tablet by mouth daily.  . propranolol ER (INDERAL LA) 60 MG 24 hr capsule Take 1 capsule (60 mg total) by mouth daily.   No current facility-administered medications on file prior to visit.    Review of Systems Per HPI unless specifically indicated in ROS section     Objective:    BP 100/70 mmHg  Pulse 72  Temp(Src) 98.3 F (36.8 C) (Oral)  Ht 5' 8.5" (1.74 m)  Wt 149 lb (67.586 kg)  BMI 22.32 kg/m2  LMP 08/08/2014  Wt Readings from Last 3 Encounters:  02/04/15 149 lb (67.586 kg)  08/21/14 146 lb (66.225 kg)  06/18/14 145 lb 1.6 oz (65.817 kg)    Physical Exam  Constitutional: She appears well-developed and well-nourished. No distress.  HENT:  Head: Normocephalic and atraumatic.  Mouth/Throat: Oropharynx is clear and moist. No oropharyngeal exudate.  Eyes: Conjunctivae and EOM are normal. Pupils are equal, round, and reactive to light. No scleral icterus.  Neck: Normal range of motion. Neck supple. No thyromegaly present.  Cardiovascular: Normal rate, regular rhythm, normal heart sounds and intact distal pulses.   No murmur heard. Pulmonary/Chest: Effort normal and  breath sounds normal. No respiratory distress. She has no wheezes. She has no rales. She exhibits no tenderness.  Abdominal: Soft. Normal appearance and bowel sounds are normal. She exhibits no distension and no mass. There is no hepatosplenomegaly. There is no tenderness. There is no rigidity, no rebound, no guarding, no CVA tenderness and negative Murphy's sign.  Musculoskeletal: She exhibits no edema.    Lymphadenopathy:    She has no cervical adenopathy.  Skin: Skin is warm and dry. No rash noted. There is erythema (easy erythema with palpation on skin).  Psychiatric: She has a normal mood and affect. Her behavior is normal. Thought content normal.  Nursing note and vitals reviewed.  Results for orders placed or performed in visit on 06/19/14  Comprehensive metabolic panel  Result Value Ref Range   Sodium 139 135 - 145 mEq/L   Potassium 4.4 3.5 - 5.3 mEq/L   Chloride 106 96 - 112 mEq/L   CO2 23 19 - 32 mEq/L   Glucose, Bld 87 70 - 99 mg/dL   BUN 9 6 - 23 mg/dL   Creat 0.90 0.50 - 1.10 mg/dL   Total Bilirubin 0.8 0.2 - 1.2 mg/dL   Alkaline Phosphatase 30 (L) 39 - 117 U/L   AST 16 0 - 37 U/L   ALT 12 0 - 35 U/L   Total Protein 6.9 6.0 - 8.3 g/dL   Albumin 4.3 3.5 - 5.2 g/dL   Calcium 9.4 8.4 - 10.5 mg/dL  CBC with Differential/Platelet  Result Value Ref Range   WBC 5.2 4.0 - 10.5 K/uL   RBC 4.17 3.87 - 5.11 MIL/uL   Hemoglobin 13.5 12.0 - 15.0 g/dL   HCT 39.2 36.0 - 46.0 %   MCV 94.0 78.0 - 100.0 fL   MCH 32.4 26.0 - 34.0 pg   MCHC 34.4 30.0 - 36.0 g/dL   RDW 12.8 11.5 - 15.5 %   Platelets 268 150 - 400 K/uL   MPV 10.7 8.6 - 12.4 fL   Neutrophils Relative % 37 (L) 43 - 77 %   Neutro Abs 1.9 1.7 - 7.7 K/uL   Lymphocytes Relative 51 (H) 12 - 46 %   Lymphs Abs 2.7 0.7 - 4.0 K/uL   Monocytes Relative 9 3 - 12 %   Monocytes Absolute 0.5 0.1 - 1.0 K/uL   Eosinophils Relative 2 0 - 5 %   Eosinophils Absolute 0.1 0.0 - 0.7 K/uL   Basophils Relative 1 0 - 1 %   Basophils Absolute 0.1 0.0 - 0.1 K/uL   Smear Review Criteria for review not met   TSH  Result Value Ref Range   TSH 1.725 0.350 - 4.500 uIU/mL  Vitamin D, 25-hydroxy  Result Value Ref Range   Vit D, 25-Hydroxy 44 30 - 100 ng/mL  B. Burgdorfi Antibodies  Result Value Ref Range   B burgdorferi Ab IgG+IgM 0.39 ISR      Assessment & Plan:  Over 45 minutes were spent face-to-face with the patient during this  encounter and >50% of that time was spent on counseling and coordination of care  Problem List Items Addressed This Visit    Palpitations    Check EKG today - NSR rate 70, normal axis, intervals, no acute ST/T changes, no old to compare. Reassuring. Will review prior records but pt states due to symptomatic PVCs.      Relevant Orders   EKG 12-Lead (Completed)   TSH   Migraine without aura    Encouraged  continued f/u with neurologist. Marena Chancy if propranolol helpful and worried it may have contributed to syncope after exertion.       Relevant Medications   venlafaxine XR (EFFEXOR XR) 75 MG 24 hr capsule   Other Relevant Orders   Cardiolipin antibody   Malaise and fatigue - Primary    Chronic issue with intermittent diarrhea, headaches, dizziness, paresthesias, vision trouble.  Has previously been evaluated by rheum and neuro, has been told no evidence of MS or demyelinating disease.  Will check for reversible causes of fatigue when pt returns. Longterm OCP use - check B6.       Relevant Orders   TSH   RNP Antibody   Vitamin B6   Sedimentation rate   Cardiolipin antibody   Angioedema    Unclear cause. Pt has seen allergist and carries epi pen.      Adjustment disorder with mixed anxiety and depressed mood    Chronic issue - sees Dr Gwenlyn Saran psychologist. Does not feel effexor related to current symptoms. Desires to continue as feels working well.       Other Visit Diagnoses    Lipid screening        Relevant Orders    Lipid panel        Follow up plan: Return if symptoms worsen or fail to improve.

## 2015-02-06 ENCOUNTER — Other Ambulatory Visit: Payer: 59

## 2015-02-06 ENCOUNTER — Other Ambulatory Visit: Payer: Self-pay | Admitting: Family Medicine

## 2015-02-06 DIAGNOSIS — R5381 Other malaise: Secondary | ICD-10-CM

## 2015-02-06 DIAGNOSIS — T783XXA Angioneurotic edema, initial encounter: Secondary | ICD-10-CM | POA: Insufficient documentation

## 2015-02-06 DIAGNOSIS — R5383 Other fatigue: Principal | ICD-10-CM

## 2015-02-06 DIAGNOSIS — R002 Palpitations: Secondary | ICD-10-CM | POA: Insufficient documentation

## 2015-02-06 NOTE — Assessment & Plan Note (Signed)
Chronic issue - sees Dr Pascal LuxKane psychologist. Does not feel effexor related to current symptoms. Desires to continue as feels working well.

## 2015-02-06 NOTE — Assessment & Plan Note (Signed)
Encouraged continued f/u with neurologist. Samantha ReaUnsure if propranolol helpful and worried it may have contributed to syncope after exertion.

## 2015-02-06 NOTE — Assessment & Plan Note (Signed)
Unclear cause. Pt has seen allergist and carries epi pen.

## 2015-02-06 NOTE — Assessment & Plan Note (Addendum)
Chronic issue with intermittent diarrhea, headaches, dizziness, paresthesias, vision trouble.  Has previously been evaluated by rheum and neuro, has been told no evidence of MS or demyelinating disease.  Will check for reversible causes of fatigue when pt returns. Longterm OCP use - check B6.

## 2015-02-06 NOTE — Assessment & Plan Note (Signed)
Check EKG today - NSR rate 70, normal axis, intervals, no acute ST/T changes, no old to compare. Reassuring. Will review prior records but pt states due to symptomatic PVCs.

## 2015-02-09 ENCOUNTER — Encounter: Payer: Self-pay | Admitting: Family Medicine

## 2015-02-09 DIAGNOSIS — M4307 Spondylolysis, lumbosacral region: Secondary | ICD-10-CM | POA: Insufficient documentation

## 2015-02-26 ENCOUNTER — Other Ambulatory Visit (INDEPENDENT_AMBULATORY_CARE_PROVIDER_SITE_OTHER): Payer: 59

## 2015-02-26 DIAGNOSIS — R002 Palpitations: Secondary | ICD-10-CM | POA: Diagnosis not present

## 2015-02-26 DIAGNOSIS — R5383 Other fatigue: Secondary | ICD-10-CM

## 2015-02-26 DIAGNOSIS — G43009 Migraine without aura, not intractable, without status migrainosus: Secondary | ICD-10-CM

## 2015-02-26 DIAGNOSIS — Z13220 Encounter for screening for lipoid disorders: Secondary | ICD-10-CM

## 2015-02-26 DIAGNOSIS — R5381 Other malaise: Secondary | ICD-10-CM

## 2015-02-26 LAB — LIPID PANEL
CHOL/HDL RATIO: 5
Cholesterol: 195 mg/dL (ref 0–200)
HDL: 39 mg/dL — ABNORMAL LOW (ref >39.00)
LDL CALC: 132 mg/dL — AB (ref 0–99)
NONHDL: 155.67
Triglycerides: 118 mg/dL (ref 0.0–149.0)
VLDL: 23.6 mg/dL (ref 0.0–40.0)

## 2015-02-26 LAB — TSH: TSH: 2.6 u[IU]/mL (ref 0.35–4.50)

## 2015-02-26 LAB — SEDIMENTATION RATE: SED RATE: 12 mm/h (ref 0–22)

## 2015-02-26 LAB — FOLATE: Folate: 21.9 ng/mL (ref >5.9)

## 2015-02-28 ENCOUNTER — Encounter: Payer: Self-pay | Admitting: Nurse Practitioner

## 2015-02-28 ENCOUNTER — Ambulatory Visit (INDEPENDENT_AMBULATORY_CARE_PROVIDER_SITE_OTHER): Payer: 59 | Admitting: Nurse Practitioner

## 2015-02-28 VITALS — BP 102/74 | HR 72 | Ht 69.0 in | Wt 149.6 lb

## 2015-02-28 DIAGNOSIS — G43009 Migraine without aura, not intractable, without status migrainosus: Secondary | ICD-10-CM | POA: Diagnosis not present

## 2015-02-28 LAB — RNP ANTIBODY: RIBONUCLEIC PROTEIN(ENA) ANTIBODY, IGG: NEGATIVE

## 2015-02-28 NOTE — Patient Instructions (Signed)
Continue propanolol at current dose does not need refills Keep a record of headaches and bring to next appointment Follow-up in 2 months

## 2015-02-28 NOTE — Progress Notes (Addendum)
[facility]  PATIENT: Samantha Norman DOB: 09-30-1985   REASON FOR VISIT: Follow-up for migraines, subjective visual disturbance HISTORY FROM: Patient    HISTORY OF PRESENT ILLNESS:Ms. Samantha Norman is a 29 year old left-handed white female with a history of headaches, and subjective visual disturbances. She was last seen in this office 08/21/14. The patient has had an extensive workup that has included MRI of the brain, cervical spine, and thoracic spine. The patient has no evidence of demyelinating disease. A visual evoked response test was unremarkable. The patient has seen an ophthalmologist who did not see anything abnormal within the eyes. The patient has been placed on propranolol,  ER daily. Her headaches worsened over the last month or so, she thinks part of this is due to sleep deprivation her father has been in the hospital. In addition she ate some foods over Thanksgiving that are migraine triggers. She continues to feel propanolol is a good medication for her however with blood pressure today of 102/74 I would not feel comfortable increasing this medication. She is currently having about 2 headaches a week, relieved with ibuprofen. She has a history of a back injury that occurred several years ago. She returns for an evaluation.   REVIEW OF SYSTEMS: Full 14 system review of systems performed and notable only for those listed, all others are neg:  Constitutional: neg  Cardiovascular: neg Ear/Nose/Throat: neg  Skin: neg Eyes: Blurred vision Respiratory: neg Gastroitestinal: neg  Hematology/Lymphatic: neg  Endocrine: neg Musculoskeletal: Back pain Allergy/Immunology: neg Neurological: Headache Psychiatric: neg Sleep : Restless leg   ALLERGIES: No Known Allergies  HOME MEDICATIONS: Outpatient Prescriptions Prior to Visit  Medication Sig Dispense Refill  . norethindrone-ethinyl estradiol 1/35 (NECON 1/35, 28,) tablet Take 1 tablet by mouth daily. 3 Package  3  . propranolol ER (INDERAL LA) 60 MG 24 hr capsule Take 1 capsule (60 mg total) by mouth daily. 90 capsule 2  . venlafaxine XR (EFFEXOR XR) 75 MG 24 hr capsule Take 1 capsule (75 mg total) by mouth daily with breakfast. 90 capsule 3   No facility-administered medications prior to visit.    PAST MEDICAL HISTORY: Past Medical History  Diagnosis Date  . Dizziness   . Migraine headache   . Spondylolysis of lumbosacral region 2004    chronic R L5  . Depression   . History of chicken pox     PAST SURGICAL HISTORY: Past Surgical History  Procedure Laterality Date  . Evoked brainstem auditory response  2014    WNL ([facility])  . Brain mri  11/2013    WNL, no demyelinating disease    FAMILY HISTORY: Family History  Problem Relation Age of Onset  . Hypertension Mother   . Cancer Mother 44    breast  . Alzheimer's disease Maternal Uncle 55    early onset  . Cancer Paternal Uncle     prostate  . Cancer Paternal Grandfather     prostate  . Stroke Father 17    unclear cause - small vessel disease  . Stroke Paternal Grandmother 33    multiple   . Stroke Paternal Uncle 91  . CAD Maternal Grandmother 80  . Diabetes Paternal Uncle     SOCIAL HISTORY: Social History   Social History  . Marital Status: Single    Spouse Name: N/A  . Number of Children: 0  . Years of Education: Pharmacy   Occupational History  . Student    Social History Main Topics  . Smoking status:  Never Smoker   . Smokeless tobacco: Never Used  . Alcohol Use: 0.0 oz/week    0 Standard drinks or equivalent per week     Comment: occasionally  . Drug Use: No  . Sexual Activity: Yes    Birth Control/ Protection: Pill   Other Topics Concern  . Not on file   Social History Narrative   Lives alone, 2 dogs    Occupation: PhD pharmacist at [employer]    Edu: PhD    Activity: walks dogs, plays tennis    Diet: good water, fruits/vegetables regularly     PHYSICAL EXAM  Filed Vitals:   02/28/15 0751  BP:  102/74  Pulse: 72  Height: 5\' 9"  (1.753 m)  Weight: 149 lb 9.6 oz (67.858 kg)   Body mass index is 22.08 kg/(m^2). Generalized: Well developed, in no acute distress  Head: normocephalic and atraumatic,. Oropharynx benign  Neck: Supple, full range of motion Musculoskeletal: No deformity   Neurological examination  Mentation: Alert oriented to time, place, history taking. Attention span and concentration appropriate. Recent and remote memory intact. Follows all commands speech and language fluent.  Cranial nerve II-XII: Visual acuity 20/30 right, 20/30  left Pupils were equal round reactive to light extraocular movements were full, visual field were full on confrontational test. Facial sensation and strength were normal. hearing was intact to finger rubbing bilaterally. Uvula tongue midline. head turning and shoulder shrug were normal and symmetric.Tongue protrusion into cheek strength was normal. Motor: normal bulk and tone, full strength in the BUE, BLE, fine finger movements normal, no pronator drift. No focal weakness Sensory intact to soft touch, vibratory, decreased pinprick to left foot normal on right.  Coordination: finger-nose-finger, heel-to-shin bilaterally, no dysmetria Reflexes: Brachioradialis 2/2, biceps 2/2, triceps 2/2, patellar 2/2, Achilles 2/2, plantar responses were flexor bilaterally. Gait and Station: Rising up from seated position without assistance, normal stance, moderate stride, good arm swing, smooth turning, able to perform tiptoe, and heel walking without difficulty. Tandem gait is steady    DIAGNOSTIC DATA (LABS, IMAGING, TESTING) - I reviewed patient records, labs, notes, testing and imaging myself where available.  Lab Results  Component Value Date   WBC 5.2 06/08/2014   HGB 13.5 06/08/2014   HCT 39.2 06/08/2014   MCV 94.0 06/08/2014   PLT 268 06/08/2014      Component Value Date/Time   NA 139 06/08/2014 1509   K 4.4 06/08/2014 1509   CL 106  06/08/2014 1509   CO2 23 06/08/2014 1509   GLUCOSE 87 06/08/2014 1509   BUN 9 06/08/2014 1509   CREATININE 0.90 06/08/2014 1509   CALCIUM 9.4 06/08/2014 1509   PROT 6.9 06/08/2014 1509   ALBUMIN 4.3 06/08/2014 1509   AST 16 06/08/2014 1509   ALT 12 06/08/2014 1509   ALKPHOS 30* 06/08/2014 1509   BILITOT 0.8 06/08/2014 1509   Lab Results  Component Value Date   CHOL 195 02/26/2015   HDL 39.00* 02/26/2015   LDLCALC 132* 02/26/2015   TRIG 118.0 02/26/2015   CHOLHDL 5 02/26/2015    Lab Results  Component Value Date   TSH 2.60 02/26/2015      ASSESSMENT AND PLAN  29 y.o. year old female  has a past medical history of Dizziness; Migraine headache; Spondylolysis of lumbosacral region (2004); Depression;  Here to follow up. She is having increasing  in headaches over the past month due to lack of sleep. Her father has been hospitalized with a stroke. She still feels propanolol  works well for her and her current headaches are relieved with ibuprofen.The patient is a current patient of Dr. Anne Hahn  who is out of the office today . This note is sent to the work in doctor.     Continue propanolol at current dose does not need refills Keep a record of headaches and bring to next appointment Follow-up in 2 months Nilda Riggs, [facility], [facility], APRN  [facility] [address] [place], [place] [address] [phone]  I reviewed the above note and documentation by the Nurse Practitioner and agree with the history, physical exam, assessment and plan as outlined above. I was immediately available for face-to-face consultation. Huston Foley, MD, PhD [facility])

## 2015-03-01 LAB — CARDIOLIPIN ANTIBODIES, IGG, IGM, IGA: Anticardiolipin IgM: <12 [MPL'U]

## 2015-03-03 LAB — VITAMIN B6: Vitamin B6: 11.8 ng/mL (ref 2.1–21.7)

## 2015-04-05 ENCOUNTER — Ambulatory Visit: Payer: 59 | Admitting: Psychology

## 2015-04-30 ENCOUNTER — Ambulatory Visit: Payer: 59 | Admitting: Nurse Practitioner

## 2015-05-01 ENCOUNTER — Encounter: Payer: Self-pay | Admitting: Nurse Practitioner

## 2015-05-17 ENCOUNTER — Other Ambulatory Visit: Payer: Self-pay | Admitting: Internal Medicine

## 2015-05-17 ENCOUNTER — Ambulatory Visit: Payer: 59 | Attending: Internal Medicine

## 2015-05-17 DIAGNOSIS — R509 Fever, unspecified: Secondary | ICD-10-CM

## 2015-05-17 LAB — CBC WITH DIFFERENTIAL/PLATELET
BASOS ABS: 0.1 10*3/uL (ref 0.0–0.1)
BASOS PCT: 4 % — AB (ref 0–1)
EOS ABS: 0 10*3/uL (ref 0.0–0.7)
Eosinophils Relative: 1 % (ref 0–5)
HCT: 42.6 % (ref 36.0–46.0)
Hemoglobin: 14.6 g/dL (ref 12.0–15.0)
Lymphocytes Relative: 31 % (ref 12–46)
Lymphs Abs: 0.7 10*3/uL (ref 0.7–4.0)
MCH: 32.2 pg (ref 26.0–34.0)
MCHC: 34.3 g/dL (ref 30.0–36.0)
MCV: 93.8 fL (ref 78.0–100.0)
MPV: 11.1 fL (ref 8.6–12.4)
Monocytes Absolute: 0.3 10*3/uL (ref 0.1–1.0)
Monocytes Relative: 14 % — ABNORMAL HIGH (ref 3–12)
NEUTROS PCT: 50 % (ref 43–77)
Neutro Abs: 1.1 10*3/uL — ABNORMAL LOW (ref 1.7–7.7)
PLATELETS: 144 10*3/uL — AB (ref 150–400)
RBC: 4.54 MIL/uL (ref 3.87–5.11)
RDW: 13.2 % (ref 11.5–15.5)
WBC: 2.2 10*3/uL — ABNORMAL LOW (ref 4.0–10.5)

## 2015-05-17 LAB — COMPLETE METABOLIC PANEL WITH GFR
ALT: 26 U/L (ref 6–29)
AST: 30 U/L (ref 10–30)
Albumin: 4.2 g/dL (ref 3.6–5.1)
Alkaline Phosphatase: 35 U/L (ref 33–115)
BILIRUBIN TOTAL: 1.1 mg/dL (ref 0.2–1.2)
BUN: 8 mg/dL (ref 7–25)
CHLORIDE: 104 mmol/L (ref 98–110)
CO2: 23 mmol/L (ref 20–31)
Calcium: 9.1 mg/dL (ref 8.6–10.2)
Creat: 0.88 mg/dL (ref 0.50–1.10)
GFR, Est African American: >89 mL/min (ref >=60)
GLUCOSE: 85 mg/dL (ref 65–99)
Potassium: 4.4 mmol/L (ref 3.5–5.3)
SODIUM: 136 mmol/L (ref 135–146)
TOTAL PROTEIN: 6.9 g/dL (ref 6.1–8.1)

## 2015-05-18 LAB — MONONUCLEOSIS SCREEN: HETEROPHILE, MONO SCREEN: NEGATIVE

## 2015-05-20 ENCOUNTER — Encounter: Payer: Self-pay | Admitting: Family Medicine

## 2015-05-20 ENCOUNTER — Ambulatory Visit (INDEPENDENT_AMBULATORY_CARE_PROVIDER_SITE_OTHER): Payer: 59 | Admitting: Family Medicine

## 2015-05-20 VITALS — BP 92/64 | HR 72 | Temp 98.2°F | Ht 69.0 in | Wt 151.0 lb

## 2015-05-20 DIAGNOSIS — D72819 Decreased white blood cell count, unspecified: Secondary | ICD-10-CM

## 2015-05-20 DIAGNOSIS — R591 Generalized enlarged lymph nodes: Secondary | ICD-10-CM

## 2015-05-20 DIAGNOSIS — R509 Fever, unspecified: Secondary | ICD-10-CM | POA: Diagnosis not present

## 2015-05-20 DIAGNOSIS — R599 Enlarged lymph nodes, unspecified: Secondary | ICD-10-CM

## 2015-05-20 LAB — POCT INFLUENZA A/B
INFLUENZA A, POC: NEGATIVE
Influenza B, POC: NEGATIVE

## 2015-05-20 NOTE — Progress Notes (Signed)
Pre visit review using our clinic review tool, if applicable. No additional management support is needed unless otherwise documented below in the visit note. 

## 2015-05-20 NOTE — Progress Notes (Signed)
Dr. Frederico Hamman T. Ashad Fawbush, MD, [facility] [place], [address] Phone: [phone] Fax: [phone]  05/20/2015  Patient: Samantha Norman, MRN: [id number], DOB: [birth year], 30 y.o.  Primary Physician:  Ria Bush, MD   Chief Complaint  Patient presents with  . Fever    Recent Mono test negative  . Muscle Aches   Subjective:   Samantha Norman is a 30 y.o. very pleasant female patient who presents with the following:  PharmD: clinical management.   Starting last Tuesday, body aches, fever at night. Lymph nodes at the back of the neck. She is exposed to patients, essentially all day long, and she has been  Achy generally in his had a fever intermittently.  She has been taking Tylenol almost around the clock for a week.  She did have a mono test was negative last week.  Her white blood cell count came back at 2.2 on Friday, and her medical director at her job recommended that she follow-up here for further workup and evaluation.  Her platelets were also slightly low.  She does have a history of night sweats, and she has had quite a bit of workup regarding this and primarily some neurological symptoms over the last few years.  Additional blood work is included in the body of this note.  Notable for HIV -2 years ago, and she also had serum TB testing which was negative this year.  Mono neg on Friday.  WBC was low.  2.2 on Friday Platelets slightly low  Night sweats.    Past Medical History, Surgical History, Social History, Family History, Problem List, Medications, and Allergies have been reviewed and updated if relevant.  Patient Active Problem List   Diagnosis Date Noted  . Spondylolysis of lumbosacral region   . Angioedema 02/06/2015  . Palpitations 02/06/2015  . Malaise and fatigue 02/06/2015  . Adjustment disorder with mixed anxiety and depressed mood 02/01/2014  . Subjective visual disturbance 12/20/2013  .  Back pain 07/01/2012  . Migraine without aura 04/19/2012  . Paresthesias 04/08/2012  . Vertigo 04/08/2012  . Fatigue 04/08/2012    Past Medical History  Diagnosis Date  . Dizziness   . Migraine headache   . Spondylolysis of lumbosacral region 2004    chronic R L5  . Depression   . History of chicken pox     Past Surgical History  Procedure Laterality Date  . Evoked brainstem auditory response  2014    WNL (GNA)  . Brain mri  11/2013    WNL, no demyelinating disease    Social History   Social History  . Marital Status: Single    Spouse Name: N/A  . Number of Children: 0  . Years of Education: Pharmacy   Occupational History  . Student    Social History Main Topics  . Smoking status: Never Smoker   . Smokeless tobacco: Never Used  . Alcohol Use: 0.0 oz/week    0 Standard drinks or equivalent per week     Comment: occasionally  . Drug Use: No  . Sexual Activity: Yes    Birth Control/ Protection: Pill   Other Topics Concern  . Not on file   Social History Narrative   Lives alone, 2 dogs    Occupation: PhD pharmacist at [employer]    Edu: PhD    Activity: walks dogs, plays tennis    Diet: good water, fruits/vegetables regularly    Family History  Problem Relation Age of Onset  . Hypertension Mother   . Cancer Mother 51    breast  . Alzheimer's disease Maternal Uncle 27    early onset  . Cancer Paternal Uncle     prostate  . Cancer Paternal Grandfather     prostate  . Stroke Father 35    unclear cause - small vessel disease  . Stroke Paternal Grandmother 35    multiple   . Stroke Paternal Uncle 25  . CAD Maternal Grandmother 80  . Diabetes Paternal Uncle     No Known Allergies  Medication list reviewed and updated in full in [facility].  ROS: GEN: Acute illness details above GI: Tolerating PO intake GU: maintaining adequate hydration and urination Pulm: No SOB Interactive and getting along well at home.  Otherwise, ROS is as per the  HPI.   Objective:   BP 92/64 mmHg  Pulse 72  Temp(Src) 98.2 F (36.8 C) (Oral)  Ht '5\' 9"'  (1.753 m)  Wt 151 lb (68.493 kg)  BMI 22.29 kg/m2  GEN: WDWN, NAD, Non-toxic, A & O x 3 HEENT: Atraumatic, Normocephalic. Neck supple. No masses, 1 very large lymph node on the right posterior chain posterior to the ear that is approximately the size of the thumbnail.  There is also one significantly smaller than this inferior to this on the posterior chain.  There is a questionable lymph node on the right side of the face as well. Ears and Nose: No external deformity. CV: RRR, No M/G/R. No JVD. No thrill. No extra heart sounds. PULM: CTA B, no wheezes, crackles, rhonchi. No retractions. No resp. distress. No accessory muscle use. ABD: S, NT, ND, +BS. No rebound. No HSM. EXTR: No c/c/e NEURO Normal gait.  PSYCH: Normally interactive. Conversant. Not depressed or anxious appearing.  Calm demeanor.     Laboratory and Imaging Data:  Recent Results (from the past 2160 hour(s))  TSH     Status: None   Collection Time: 02/26/15  8:35 AM  Result Value Ref Range   TSH 2.60 0.35 - 4.50 uIU/mL  Lipid panel     Status: Abnormal   Collection Time: 02/26/15  8:35 AM  Result Value Ref Range   Cholesterol 195 0 - 200 mg/dL    Comment: ATP III Classification       Desirable:  < 200 mg/dL               Borderline High:  200 - 239 mg/dL          High:  > = 240 mg/dL   Triglycerides 118.0 0.0 - 149.0 mg/dL    Comment: Normal:  <150 mg/dLBorderline High:  150 - 199 mg/dL   HDL 39.00 (L) >39.00 mg/dL   VLDL 23.6 0.0 - 40.0 mg/dL   LDL Cholesterol 132 (H) 0 - 99 mg/dL   Total CHOL/HDL Ratio 5     Comment:                Men          Women1/2 Average Risk     3.4          3.3Average Risk          5.0          4.42X Average Risk          9.6          7.13X Average Risk          15.0  11.0                       NonHDL 155.67     Comment: NOTE:  Non-HDL goal should be 30 mg/dL higher than patient's LDL  goal (i.e. LDL goal of < 70 mg/dL, would have non-HDL goal of < 100 mg/dL)  RNP Antibody     Status: None   Collection Time: 02/26/15  8:35 AM  Result Value Ref Range   Ribonucleic Protein(ENA) Antibody, IgG <1.0 NEG <1.0 NEG AI  Vitamin B6     Status: None   Collection Time: 02/26/15  8:35 AM  Result Value Ref Range   Vitamin B6 11.8 2.1 - 21.7 ng/mL    Comment: Vitamin supplementation within 24 hours prior to blood draw may affect the accuracy of the results. This test was developed and its analytical performance characteristics have been determined by [place], [place]. It has not been cleared or approved by the U.S. Food and Drug Administration. This assay has been validated pursuant to the CLIA regulations and is used for clinical purposes.   Sedimentation rate     Status: None   Collection Time: 02/26/15  8:35 AM  Result Value Ref Range   Sed Rate 12 0 - 22 mm/hr  Folate     Status: None   Collection Time: 02/26/15  8:35 AM  Result Value Ref Range   Folate 21.9 >5.9 ng/mL  Cardiolipin antibodies, IgG, IgM, IgA     Status: None   Collection Time: 02/26/15  8:35 AM  Result Value Ref Range   Anticardiolipin IgA <11 APL    Comment:                                    Value      Interpretation                                 -------     --------------                               < or = 11     Negative                                 12 - 20     Indeterminate                                 21 - 80     Low to Medium Positive                                    > 80     High Positive    Anticardiolipin IgG <14 GPL    Comment:                                    Value      Interpretation                                 -------     --------------                               <  or = 14     Negative                                 15 - 20     Indeterminate                                 21 - 80     Low to Medium Positive                                     > 80     High Positive    Anticardiolipin IgM <12 MPL    Comment:                                    Value      Interpretation                                 -------     --------------                               < or = 12     Negative                                 13 - 20     Indeterminate                                 21 - 80     Low to Medium Positive                                    > 80     High Positive The antiphospholipid antibody syndrome (APS) is a clinical-pathologic correlation that includes a clinical event (e.g. thrombosis, pregnancy loss, thrombocytopenia) and persistent positive antiphospholipid antibodies (IgM or IgG ACA >40 MPL/GPL, IgM or IgG anti-b2GPI antibodies or a lupus anticoagulant). The IgA isotype has been implicated in smaller studies, but have not yet been incorporated into the APS criteria. International consensus guidelines suggest waiting at least 12 weeks before retesting to confirm antibody persistence. Reference: Philip Aspen EBXIDHW 2006: 4; 295   Mononucleosis screen     Status: None   Collection Time: 05/17/15  9:49 AM  Result Value Ref Range   Heterophile, Mono Screen NEGATIVE Negative  CBC with Differential/Platelet     Status: Abnormal   Collection Time: 05/17/15  9:49 AM  Result Value Ref Range   WBC 2.2 (L) 4.0 - 10.5 K/uL   RBC 4.54 3.87 - 5.11 MIL/uL   Hemoglobin 14.6 12.0 - 15.0 g/dL   HCT 42.6 36.0 - 46.0 %   MCV 93.8 78.0 - 100.0 fL   MCH 32.2 26.0 - 34.0 pg   MCHC 34.3 30.0 - 36.0 g/dL   RDW 13.2 11.5 - 15.5 %   Platelets 144 (L) 150 - 400 K/uL  MPV 11.1 8.6 - 12.4 fL   Neutrophils Relative % 50 43 - 77 %   Neutro Abs 1.1 (L) 1.7 - 7.7 K/uL   Lymphocytes Relative 31 12 - 46 %   Lymphs Abs 0.7 0.7 - 4.0 K/uL   Monocytes Relative 14 (H) 3 - 12 %   Monocytes Absolute 0.3 0.1 - 1.0 K/uL   Eosinophils Relative 1 0 - 5 %   Eosinophils Absolute 0.0 0.0 - 0.7 K/uL   Basophils Relative 4 (H) 0 - 1 %   Basophils  Absolute 0.1 0.0 - 0.1 K/uL   Smear Review Criteria for review not met   COMPLETE METABOLIC PANEL WITH GFR     Status: None   Collection Time: 05/17/15  9:49 AM  Result Value Ref Range   Sodium 136 135 - 146 mmol/L   Potassium 4.4 3.5 - 5.3 mmol/L   Chloride 104 98 - 110 mmol/L   CO2 23 20 - 31 mmol/L   Glucose, Bld 85 65 - 99 mg/dL   BUN 8 7 - 25 mg/dL   Creat 0.88 0.50 - 1.10 mg/dL   Total Bilirubin 1.1 0.2 - 1.2 mg/dL   Alkaline Phosphatase 35 33 - 115 U/L   AST 30 10 - 30 U/L   ALT 26 6 - 29 U/L   Total Protein 6.9 6.1 - 8.1 g/dL   Albumin 4.2 3.6 - 5.1 g/dL   Calcium 9.1 8.6 - 10.2 mg/dL   GFR, Est African American >89 >=60 mL/min   GFR, Est Non African American >89 >=60 mL/min    Comment:   The estimated GFR is a calculation valid for adults (>=40 years old) that uses the CKD-EPI algorithm to adjust for age and sex. It is   not to be used for children, pregnant women, hospitalized patients,    patients on dialysis, or with rapidly changing kidney function. According to the NKDEP, eGFR >89 is normal, 60-89 shows mild impairment, 30-59 shows moderate impairment, 15-29 shows severe impairment and <15 is ESRD.     POCT Influenza A/B     Status: Normal   Collection Time: 05/20/15  4:28 PM  Result Value Ref Range   Influenza A, POC Negative Negative   Influenza B, POC Negative Negative    Lab Results  Component Value Date   HIV1X2 Non Reactive 08/12/2012     Assessment and Plan:   Decreased white blood cell count - Plan: Pathologist smear review  Fever, unspecified - Plan: POCT Influenza A/B, Pathologist smear review  Enlarged lymph nodes   Fever, unclear source with at least one very large lymph node behind her right ear.  White blood cell count of 2.2.  I would expect most likely that this is a viral process, but other or malicious process such as lymphoma or other neoplasm cannot be excluded.  Obtain peripheral smear.  The patient has a PhD in pharmacy and is  well informed.  She understands that if her lymph node does not decrease in size that she needs to have this evaluated and significantly greater detail, and she will follow-up with Dr. Darnell Level if this is the case.  I suggested that she could wait  For about one month to see if it resolves on its own.  Orders Placed This Encounter  Procedures  . Pathologist smear review  . POCT Influenza A/B    Signed,  Melyna Huron T. Kimberlyann Hollar, MD   Patient's Medications  New Prescriptions   No medications on  file  Previous Medications   NORETHINDRONE-ETHINYL ESTRADIOL 1/35 (NECON 1/35, 28,) TABLET    Take 1 tablet by mouth daily.   PROPRANOLOL ER (INDERAL LA) 60 MG 24 HR CAPSULE    Take 1 capsule (60 mg total) by mouth daily.   VENLAFAXINE XR (EFFEXOR XR) 75 MG 24 HR CAPSULE    Take 1 capsule (75 mg total) by mouth daily with breakfast.  Modified Medications   No medications on file  Discontinued Medications   No medications on file

## 2015-05-22 LAB — PATHOLOGIST SMEAR REVIEW

## 2015-05-24 ENCOUNTER — Other Ambulatory Visit: Payer: Self-pay | Admitting: Internal Medicine

## 2015-05-24 ENCOUNTER — Other Ambulatory Visit: Payer: Self-pay

## 2015-05-24 DIAGNOSIS — B2780 Other infectious mononucleosis without complication: Secondary | ICD-10-CM

## 2015-05-27 DIAGNOSIS — B2780 Other infectious mononucleosis without complication: Secondary | ICD-10-CM | POA: Diagnosis not present

## 2015-05-29 ENCOUNTER — Other Ambulatory Visit: Payer: Self-pay | Admitting: Nurse Practitioner

## 2015-05-29 ENCOUNTER — Ambulatory Visit (INDEPENDENT_AMBULATORY_CARE_PROVIDER_SITE_OTHER): Payer: 59 | Admitting: Family Medicine

## 2015-05-29 DIAGNOSIS — J029 Acute pharyngitis, unspecified: Secondary | ICD-10-CM | POA: Insufficient documentation

## 2015-05-29 LAB — POCT RAPID STREP A (OFFICE): RAPID STREP A SCREEN: NEGATIVE

## 2015-05-29 MED ORDER — PENICILLIN G BENZATHINE 1200000 UNIT/2ML IM SUSP
1.2000 10*6.[IU] | Freq: Once | INTRAMUSCULAR | Status: AC
  Administered 2015-05-29: 1.2 10*6.[IU] via INTRAMUSCULAR

## 2015-05-29 MED FILL — VENLAFAXINE HCL ER 75 MG CA: 75 | 90 days supply | Qty: 90 | Fill #4

## 2015-05-29 MED FILL — PROPRANOLOL ER 60 MG CAP: 60 | 90 days supply | Qty: 90 | Fill #0

## 2015-05-29 NOTE — Progress Notes (Signed)
   Subjective:   Samantha Norman is a 30 y.o. female with a history of migraine, adjustment disorder here for sore throat  Patient reports that she was diagnosed with mono recently (symptoms present for ~2 wks, but monospot negative and IgM just resulted within last 2 days).  She started getting throat pain 3 days ago and it became extremely difficult and painful to swallow anything yesterday.  She has had intermittent subjective fevers for 2 wks.  Deneis any cough.  Has not tried any medications.  Nothing seems to make throat pain worse.  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  Review of Systems:  Per HPI.   Social History: never smoker  Objective:  There were no vitals taken for this visit.  Gen:  30 y.o. female in NAD HEENT: NCAT, MMM, EOMI, PERRL, anicteric sclerae, OP erythematous with b/l tonsillar exudate Neck: Diffuse cervical LAD CV: RRR, no MRG Resp: Non-labored, CTAB, no wheezes noted Ext: WWP, no edema MSK: No obvious deformities Neuro: Alert and oriented, speech normal    Assessment & Plan:     Samantha Norman is a 30 y.o. female here for sore throat  Sore throat Rapid strep negative, but concern for strep pharyngitis remains high given fevers, LAD, tonsillar exudate and lack of cough Patient treated with IM Bicillin in clinic Unable to send GAS culture without swabbing throat again, so will forgo currently    Erasmo Downer, MD MPH PGY-2,  [facility] 05/29/2015  4:29 PM

## 2015-05-29 NOTE — Patient Instructions (Signed)

## 2015-05-29 NOTE — Assessment & Plan Note (Signed)
Rapid strep negative, but concern for strep pharyngitis remains high given fevers, LAD, tonsillar exudate and lack of cough Patient treated with IM Bicillin in clinic Unable to send GAS culture without swabbing throat again, so will forgo currently

## 2015-05-30 ENCOUNTER — Encounter (HOSPITAL_COMMUNITY): Payer: Self-pay | Admitting: *Deleted

## 2015-05-30 ENCOUNTER — Emergency Department (HOSPITAL_COMMUNITY): Payer: 59

## 2015-05-30 ENCOUNTER — Inpatient Hospital Stay (HOSPITAL_COMMUNITY)
Admission: EM | Admit: 2015-05-30 | Discharge: 2015-06-01 | DRG: 153 | Disposition: A | Discharge status: Home / Self Care | Payer: 59 | Attending: Internal Medicine | Admitting: Internal Medicine

## 2015-05-30 ENCOUNTER — Emergency Department (INDEPENDENT_AMBULATORY_CARE_PROVIDER_SITE_OTHER)
Admission: EM | Admit: 2015-05-30 | Discharge: 2015-05-30 | Disposition: A | Discharge status: Other Acute Inpatient Hospital | Payer: 59 | Source: Home / Self Care

## 2015-05-30 DIAGNOSIS — J039 Acute tonsillitis, unspecified: Secondary | ICD-10-CM

## 2015-05-30 DIAGNOSIS — B2790 Infectious mononucleosis, unspecified without complication: Secondary | ICD-10-CM | POA: Diagnosis not present

## 2015-05-30 DIAGNOSIS — J392 Other diseases of pharynx: Secondary | ICD-10-CM | POA: Diagnosis not present

## 2015-05-30 DIAGNOSIS — J029 Acute pharyngitis, unspecified: Secondary | ICD-10-CM | POA: Diagnosis not present

## 2015-05-30 DIAGNOSIS — B2799 Infectious mononucleosis, unspecified with other complication: Secondary | ICD-10-CM

## 2015-05-30 DIAGNOSIS — K122 Cellulitis and abscess of mouth: Secondary | ICD-10-CM | POA: Diagnosis not present

## 2015-05-30 HISTORY — DX: Pneumonia, unspecified organism: J18.9

## 2015-05-30 HISTORY — DX: Cellulitis and abscess of mouth: K12.2

## 2015-05-30 HISTORY — DX: Family history of other specified conditions: Z84.89

## 2015-05-30 HISTORY — DX: Infectious mononucleosis, unspecified without complication: B27.90

## 2015-05-30 HISTORY — DX: Unspecified fracture of unspecified lumbar vertebra, initial encounter for closed fracture: S32.009A

## 2015-05-30 LAB — CBC WITH DIFFERENTIAL/PLATELET
BASOS ABS: 0.1 10*3/uL (ref 0.0–0.1)
BASOS PCT: 1 %
Eosinophils Absolute: 0 10*3/uL (ref 0.0–0.7)
Eosinophils Relative: 0 %
HEMATOCRIT: 41.2 % (ref 36.0–46.0)
HEMOGLOBIN: 13.3 g/dL (ref 12.0–15.0)
Lymphocytes Relative: 41 %
Lymphs Abs: 4.1 10*3/uL — ABNORMAL HIGH (ref 0.7–4.0)
MCH: 30.7 pg (ref 26.0–34.0)
MCHC: 32.3 g/dL (ref 30.0–36.0)
MCV: 95.2 fL (ref 78.0–100.0)
Monocytes Absolute: 1.1 10*3/uL — ABNORMAL HIGH (ref 0.1–1.0)
Monocytes Relative: 11 %
NEUTROS ABS: 4.7 10*3/uL (ref 1.7–7.7)
NEUTROS PCT: 47 %
Platelets: 250 10*3/uL (ref 150–400)
RBC: 4.33 MIL/uL (ref 3.87–5.11)
RDW: 12.5 % (ref 11.5–15.5)
WBC: 10 10*3/uL (ref 4.0–10.5)

## 2015-05-30 LAB — BASIC METABOLIC PANEL
ANION GAP: 12 (ref 5–15)
BUN: 17 mg/dL (ref 6–20)
CALCIUM: 9.5 mg/dL (ref 8.9–10.3)
CO2: 23 mmol/L (ref 22–32)
Chloride: 106 mmol/L (ref 101–111)
Creatinine, Ser: 0.99 mg/dL (ref 0.44–1.00)
Glucose, Bld: 87 mg/dL (ref 65–99)
Potassium: 4.4 mmol/L (ref 3.5–5.1)
Sodium: 141 mmol/L (ref 135–145)

## 2015-05-30 MED ORDER — HYDROMORPHONE HCL 1 MG/ML IJ SOLN
1.0000 mg | Freq: Once | INTRAMUSCULAR | Status: AC
  Administered 2015-05-30: 1 mg via INTRAVENOUS
  Filled 2015-05-30: qty 1

## 2015-05-30 MED ORDER — IOHEXOL 300 MG/ML  SOLN
75.0000 mL | Freq: Once | INTRAMUSCULAR | Status: AC | PRN
  Administered 2015-05-30: 75 mL via INTRAVENOUS

## 2015-05-30 MED ORDER — DEXAMETHASONE SODIUM PHOSPHATE 10 MG/ML IJ SOLN
10.0000 mg | Freq: Once | INTRAMUSCULAR | Status: AC
  Administered 2015-05-30: 10 mg via INTRAVENOUS
  Filled 2015-05-30: qty 1

## 2015-05-30 MED ORDER — POLYETHYLENE GLYCOL 3350 17 G PO PACK: 17.0000 g | PACK | Freq: Every day | ORAL | Status: DC | PRN

## 2015-05-30 MED ORDER — DEXAMETHASONE SODIUM PHOSPHATE 4 MG/ML IJ SOLN: 4.0000 mg | Freq: Three times a day (TID) | INTRAMUSCULAR | Status: DC

## 2015-05-30 MED ORDER — SODIUM CHLORIDE 0.9 % IV BOLUS (SEPSIS)
1000.0000 mL | Freq: Once | INTRAVENOUS | Status: AC
  Administered 2015-05-30: 1000 mL via INTRAVENOUS

## 2015-05-30 MED ORDER — ONDANSETRON HCL 4 MG/2ML IJ SOLN
4.0000 mg | Freq: Once | INTRAMUSCULAR | Status: AC
  Administered 2015-05-30: 4 mg via INTRAVENOUS
  Filled 2015-05-30: qty 2

## 2015-05-30 MED ORDER — ENOXAPARIN SODIUM 40 MG/0.4ML ~~LOC~~ SOLN
40.0000 mg | SUBCUTANEOUS | Status: DC
Start: 2015-05-31 — End: 2015-06-01
  Administered 2015-05-31: 40 mg via SUBCUTANEOUS
  Filled 2015-05-30: qty 0.4

## 2015-05-30 MED ORDER — DEXAMETHASONE SODIUM PHOSPHATE 4 MG/ML IJ SOLN
8.0000 mg | Freq: Three times a day (TID) | INTRAMUSCULAR | Status: AC
  Administered 2015-05-30 – 2015-05-31 (×3): 8 mg via INTRAVENOUS
  Filled 2015-05-30 (×3): qty 2

## 2015-05-30 MED ORDER — VENLAFAXINE HCL ER 75 MG PO CP24
75.0000 mg | ORAL_CAPSULE | Freq: Every day | ORAL | Status: DC
  Administered 2015-05-31: 75 mg via ORAL
  Filled 2015-05-30 (×3): qty 1

## 2015-05-30 MED ORDER — CLINDAMYCIN PHOSPHATE 600 MG/50ML IV SOLN
600.0000 mg | Freq: Once | INTRAVENOUS | Status: AC
  Administered 2015-05-30: 600 mg via INTRAVENOUS
  Filled 2015-05-30: qty 50

## 2015-05-30 MED ORDER — PROPRANOLOL HCL ER 60 MG PO CP24
60.0000 mg | ORAL_CAPSULE | Freq: Every day | ORAL | Status: DC
  Administered 2015-05-31: 60 mg via ORAL
  Filled 2015-05-30 (×4): qty 1

## 2015-05-30 MED ORDER — NORETHINDRONE-ETH ESTRADIOL 1-35 MG-MCG PO TABS
1.0000 | ORAL_TABLET | Freq: Every day | ORAL | Status: DC
  Administered 2015-05-31: 1 via ORAL

## 2015-05-30 MED ORDER — DEXAMETHASONE SODIUM PHOSPHATE 4 MG/ML IJ SOLN
6.0000 mg | Freq: Three times a day (TID) | INTRAMUSCULAR | Status: DC
  Administered 2015-05-31 – 2015-06-01 (×2): 6 mg via INTRAVENOUS
  Filled 2015-05-30 (×2): qty 2

## 2015-05-30 MED ORDER — PROMETHAZINE HCL 25 MG PO TABS
12.5000 mg | ORAL_TABLET | Freq: Four times a day (QID) | ORAL | Status: DC | PRN
Start: 2015-05-30 — End: 2015-06-01
  Administered 2015-05-30: 12.5 mg via ORAL
  Filled 2015-05-30: qty 1

## 2015-05-30 MED ORDER — CLINDAMYCIN PHOSPHATE 900 MG/50ML IV SOLN
900.0000 mg | Freq: Three times a day (TID) | INTRAVENOUS | Status: DC
  Administered 2015-05-31 – 2015-06-01 (×4): 900 mg via INTRAVENOUS
  Filled 2015-05-30 (×6): qty 50

## 2015-05-30 NOTE — ED Notes (Signed)
Pt made aware of bed assignment 

## 2015-05-30 NOTE — ED Provider Notes (Signed)
CSN: [id number]     Arrival date & time 05/30/15  1352 History   None    Chief Complaint  Patient presents with  . Sore Throat   (Consider location/radiation/quality/duration/timing/severity/associated sxs/prior Treatment) Patient is a 30 y.o. female presenting with pharyngitis. The history is provided by the patient and the spouse.  Sore Throat This is a new problem. The current episode started more than 1 week ago (dx with mon , 10d ago, now worse with trismus and limited po intake.). The problem has been gradually worsening. The symptoms are aggravated by swallowing.    Past Medical History  Diagnosis Date  . Dizziness   . Migraine headache   . Spondylolysis of lumbosacral region 2004    chronic R L5  . Depression   . History of chicken pox    Past Surgical History  Procedure Laterality Date  . Evoked brainstem auditory response  2014    WNL (GNA)  . Brain mri  11/2013    WNL, no demyelinating disease   Family History  Problem Relation Age of Onset  . Hypertension Mother   . Cancer Mother 70    breast  . Alzheimer's disease Maternal Uncle 60    early onset  . Cancer Paternal Uncle     prostate  . Cancer Paternal Grandfather     prostate  . Stroke Father 42    unclear cause - small vessel disease  . Stroke Paternal Grandmother 31    multiple   . Stroke Paternal Uncle 54  . CAD Maternal Grandmother 80  . Diabetes Paternal Uncle    Social History  Substance Use Topics  . Smoking status: Never Smoker   . Smokeless tobacco: Never Used  . Alcohol Use: 0.0 oz/week    0 Standard drinks or equivalent per week     Comment: occasionally   OB History    Gravida Para Term Preterm AB TAB SAB Ectopic Multiple Living       Review of Systems  Constitutional: Positive for chills and appetite change.  HENT: Positive for sore throat and trouble swallowing.   Hematological: Positive for adenopathy.    Allergies  Review of patient's allergies  indicates no known allergies.  Home Medications   Prior to Admission medications   Medication Sig Start Date End Date Taking? Authorizing Provider  norethindrone-ethinyl estradiol 1/35 (NECON 1/35, 28,) tablet Take 1 tablet by mouth daily. 06/18/14   Willodean Rosenthal, MD  propranolol ER (INDERAL LA) 60 MG 24 hr capsule TAKE 1 CAPSULE BY MOUTH ONCE DAILY 05/29/15   York Spaniel, MD  venlafaxine XR (EFFEXOR XR) 75 MG 24 hr capsule Take 1 capsule (75 mg total) by mouth daily with breakfast. 02/04/15   Eustaquio Boyden, MD   Meds Ordered and Administered this Visit  Medications - No data to display  BP 113/80 mmHg  Pulse 94  Temp(Src) 98 F (36.7 C) (Oral)  Resp 16  SpO2 98% No data found.   Physical Exam  Constitutional: She is oriented to person, place, and time. She appears well-developed and well-nourished. She appears ill. No distress.  HENT:  Right Ear: External ear normal.  Left Ear: External ear normal.  Mouth/Throat: Oropharyngeal exudate present.  Left tonsillar exudates, asymmetric sts.  Neck: Normal range of motion. Neck supple.  bilat post cerv adenopathy   Pulmonary/Chest: Effort normal and breath sounds normal.  Lymphadenopathy:    She has cervical adenopathy.  Neurological: She is alert and oriented to person, place, and time.  Skin: Skin is warm and dry.  Nursing note and vitals reviewed.   ED Course  Procedures (including critical care time)  Labs Review Labs Reviewed - No data to display  Imaging Review No results found.   Visual Acuity Review  Right Eye Distance:   Left Eye Distance:   Bilateral Distance:    Right Eye Near:   Left Eye Near:    Bilateral Near:         MDM   1. Infectious mononucleosis, with other complication    Sent for hosp for ivf and steroids per rec of dr bates-ent from mono and poor po intake.    Linna Hoff, MD 05/30/15 [phone]

## 2015-05-30 NOTE — ED Notes (Signed)
Pt  Has   sorethroat  And  Swelling   Unable  To   Swallow       Liquids  Pt  Has  Been   Sick  X  2  Weeks      Had  Pos    Mono        Results  3  Days         Seen  By  Her pcp   Had  A  Bicillin   Shot    [employer]

## 2015-05-30 NOTE — H&P (Addendum)
PCP:   Eustaquio Boyden, MD   Chief Complaint:    HPI: This is a 30 year old female who was diagnosed with mono 2 weeks ago. She did well initially but over the past week she has had increasing difficulty with swallowing. She now has difficulty swallowing liquids and solids. Swallowing is painful and she states she is unable to get the food passed. Pain is worse on the left. She went to urgent care last night, she was prescribed Bicillin. Her symptoms have not improved and she came to ER tonight.   Review of Systems:  The patient denies anorexia, fever, weight loss,, vision loss, decreased hearing, hoarseness, difficulty swallowing, chest pain, syncope, dyspnea on exertion, peripheral edema, balance deficits, hemoptysis, abdominal pain, melena, hematochezia, severe indigestion/heartburn, hematuria, incontinence, genital sores, muscle weakness, suspicious skin lesions, transient blindness, difficulty walking, depression, unusual weight change, abnormal bleeding, enlarged lymph nodes, angioedema, and breast masses.  Past Medical History: Past Medical History  Diagnosis Date  . Dizziness   . Migraine headache   . Spondylolysis of lumbosacral region 2004    chronic R L5  . Depression   . History of chicken pox   . Mononucleosis    Past Surgical History  Procedure Laterality Date  . Evoked brainstem auditory response  2014    WNL (GNA)  . Brain mri  11/2013    WNL, no demyelinating disease    Medications: Prior to Admission medications   Medication Sig Start Date End Date Taking? Authorizing Provider  norethindrone-ethinyl estradiol 1/35 (NECON 1/35, 28,) tablet Take 1 tablet by mouth daily. 06/18/14  Yes Willodean Rosenthal, MD  propranolol ER (INDERAL LA) 60 MG 24 hr capsule TAKE 1 CAPSULE BY MOUTH ONCE DAILY 05/29/15  Yes York Spaniel, MD  venlafaxine XR (EFFEXOR XR) 75 MG 24 hr capsule Take 1 capsule (75 mg total) by mouth daily with breakfast. 02/04/15  Yes Eustaquio Boyden, MD     Allergies:  No Known Allergies  Social History:  reports that she has never smoked. She has never used smokeless tobacco. She reports that she does not drink alcohol or use illicit drugs.  Family History: Family History  Problem Relation Age of Onset  . Hypertension Mother   . Cancer Mother 64    breast  . Alzheimer's disease Maternal Uncle 8    early onset  . Cancer Paternal Uncle     prostate  . Cancer Paternal Grandfather     prostate  . Stroke Father 75    unclear cause - small vessel disease  . Stroke Paternal Grandmother 33    multiple   . Stroke Paternal Uncle 36  . CAD Maternal Grandmother 80  . Diabetes Paternal Uncle     Physical Exam: Filed Vitals:   05/30/15 2000 05/30/15 2015 05/30/15 2030 05/30/15 2045  BP: 110/75 103/77 111/76 103/75  Pulse: 88 84 101 89  Temp:      TempSrc:      Resp: 16  16 14   Height:      Weight:      SpO2: 98% 99% 99% 98%    General:  Alert and oriented times three, well developed and nourished, no acute distress Eyes: PERRLA, pink conjunctiva, no scleral icterus ENT: Submandibular area left side is further when compared to right. Unable to see tonsils secondary to swelling Lungs: clear to ascultation, no wheeze, no crackles, no use of accessory muscles Cardiovascular: regular rate and rhythm, no regurgitation, no gallops, no murmurs. No carotid bruits, no  JVD Abdomen: soft, positive BS, non-tender, non-distended, no organomegaly, not an acute abdomen GU: not examined Neuro: CN II - XII grossly intact, sensation intact Musculoskeletal: strength 5/5 all extremities, no clubbing, cyanosis or edema Skin: no rash, no subcutaneous crepitation, no decubitus Psych: appropriate patient   Labs on Admission:   Recent Labs  05/30/15 1850  NA 141  K 4.4  CL 106  CO2 23  GLUCOSE 87  BUN 17  CREATININE 0.99  CALCIUM 9.5   No results for input(s): AST, ALT, ALKPHOS, BILITOT, PROT, ALBUMIN in the last 72 hours. No results  for input(s): LIPASE, AMYLASE in the last 72 hours.  Recent Labs  05/30/15 1850  WBC 10.0  NEUTROABS 4.7  HGB 13.3  HCT 41.2  MCV 95.2  PLT 250   No results for input(s): CKTOTAL, CKMB, CKMBINDEX, TROPONINI in the last 72 hours. Invalid input(s): POCBNP No results for input(s): DDIMER in the last 72 hours. No results for input(s): HGBA1C in the last 72 hours. No results for input(s): CHOL, HDL, LDLCALC, TRIG, CHOLHDL, LDLDIRECT in the last 72 hours. No results for input(s): TSH, T4TOTAL, T3FREE, THYROIDAB in the last 72 hours.  Invalid input(s): FREET3 No results for input(s): VITAMINB12, FOLATE, FERRITIN, TIBC, IRON, RETICCTPCT in the last 72 hours.  Micro Results: No results found for this or any previous visit (from the past 240 hour(s)).   Radiological Exams on Admission: Ct Soft Tissue Neck W Contrast  05/30/2015  CLINICAL DATA:  30 year old female with sore throat. Difficulty swallowing. Initial encounter. EXAM: CT NECK WITH CONTRAST TECHNIQUE: Multidetector CT imaging of the neck was performed using the standard protocol following the bolus administration of intravenous contrast. CONTRAST:  75mL OMNIPAQUE IOHEXOL 300 MG/ML  SOLN COMPARISON:  None. FINDINGS: Pharynx and larynx: Diffuse inflammation of the left palatine tonsil. Dental artifact limits evaluation. Within the inflamed left tonsil subtle low-density structure measuring up to 1.3 x 0.8 cm possibly represents small contained abscess although difficult to discern with any certainty. Extension of inflammatory process across midline to the right. Extension into the left parapharyngeal space with hazy infiltration of fat planes (bordering the left muscles of mastication). Small amount of retropharyngeal fluid. No deep drainable abscess currently noted. Narrowing of the air column greater on the left. Inflammation extends to the level of the left piriform sinus which is slightly narrowed. Salivary glands: No primary salivary  gland abnormality. Thyroid: Right lower thyroid gland 6 mm nodule. Lymph nodes: Diffuse bilateral adenopathy greatest level 2 region. Vascular: No evidence of thrombosis of the internal jugular veins. Limited intracranial: Negative. Visualized orbits: Negative. Mastoids and visualized paranasal sinuses: Clear. Skeleton: No acute abnormality. Upper chest: Negative. IMPRESSION: Diffuse left-sided tonsillitis with spread of inflammatory process into the left parapharyngeal space (bordering the left muscles of mastication) with small amount of fluid in the retropharyngeal region without deep drainable abscess. Small abscesses within the inflamed left tonsil may be present although dental artifact limits evaluation. Narrowing of the air column greater on the left. Narrowing of left piriform sinus. Please see above. Diffuse bilateral adenopathy most notable level 2 region. These results were called by telephone at the time of interpretation on 05/30/2015 at 8:00 pm to Sharen Hones NP who verbally acknowledged these results. Electronically Signed   By: Lacy Duverney M.D.   On: 05/30/2015 20:15    Assessment/Plan Present on Admission:  . Ludwig's angina -Admit to MedSurg -IV antibiotics clindamycin 900 mg IV every 8 and a Decadron taper -Clear liquid diet and  advance as tolerated -IV fluid hydration onto patient is eating properly    Zetha Kuhar 05/30/2015, 9:32 PM

## 2015-05-30 NOTE — ED Provider Notes (Signed)
Radiology called notify me that the patient has fracture pharyngeal cellulitis, tonsillitis, worse on the left than the right.  On examination, patient is still having tremendous this difficulty swallowing her own secretions and appears uncomfortable. I have started IV clindamycin increased her fluids and made arrangements for admission  Earley Favor, NP 05/30/15 2051  Lorre Nick, MD 05/30/15 [phone]

## 2015-05-30 NOTE — ED Notes (Addendum)
Per pt - pt has been experiencing sore throat that began on Saturday, worse yesterday. Was seen at [facility] today and was referred to ER d/t throat edema and poor PO intake d/t pt unable to swallow liquids. Pt admits to being dx with mono on Tuesday. Unable to visualize pts pharynx in triage as pt states she is unable to open her mouth wide enough, pt handling oral secretions well.

## 2015-05-30 NOTE — ED Provider Notes (Addendum)
CSN: [id number]     Arrival date & time 05/30/15  1614 History   First MD Initiated Contact with Patient 05/30/15 1817     Chief Complaint  Patient presents with  . Oral Swelling     (Consider location/radiation/quality/duration/timing/severity/associated sxs/prior Treatment) HPI   Pt recently diagnosed with mononucleosis sent from urgent care for throat swelling and decreased PO intake.  Pt has had sore throat x 2 weeks, initially had fevers, has had lymphadenopathy.  Has had blood work done at [employer] where she works and was finally diagnosed with mono.  Has not been able to tolerate any food by mouth x 2 days.  Has been taking tylenol and ibuprofen for pain but recently has had difficulty swallowing the pills.  Was treated with bicillin yesterday.  Denies abdominal pain.  Has not had any trauma to her abdomen during this illness.   Past Medical History  Diagnosis Date  . Dizziness   . Migraine headache   . Spondylolysis of lumbosacral region 2004    chronic R L5  . Depression   . History of chicken pox   . Mononucleosis    Past Surgical History  Procedure Laterality Date  . Evoked brainstem auditory response  2014    WNL (GNA)  . Brain mri  11/2013    WNL, no demyelinating disease   Family History  Problem Relation Age of Onset  . Hypertension Mother   . Cancer Mother 16    breast  . Alzheimer's disease Maternal Uncle 33    early onset  . Cancer Paternal Uncle     prostate  . Cancer Paternal Grandfather     prostate  . Stroke Father 42    unclear cause - small vessel disease  . Stroke Paternal Grandmother 54    multiple   . Stroke Paternal Uncle 27  . CAD Maternal Grandmother 80  . Diabetes Paternal Uncle    Social History  Substance Use Topics  . Smoking status: Never Smoker   . Smokeless tobacco: Never Used  . Alcohol Use: No     Comment: occasionally   OB History    Gravida Para Term Preterm AB TAB SAB Ectopic Multiple Living   0 0  0 0 0 0 0 0 0 0     Review of Systems  Constitutional: Positive for fever and fatigue. Negative for appetite change.  HENT: Positive for sore throat, trouble swallowing and voice change.   Respiratory: Negative for cough, shortness of breath, wheezing and stridor.   Gastrointestinal: Negative for abdominal pain.  Musculoskeletal: Negative for myalgias.  Skin: Negative for color change, rash and wound.  Allergic/Immunologic: Negative for immunocompromised state.  Hematological: Does not bruise/bleed easily.  Psychiatric/Behavioral: Negative for self-injury.      Allergies  Review of patient's allergies indicates no known allergies.  Home Medications   Prior to Admission medications   Medication Sig Start Date End Date Taking? Authorizing Provider  norethindrone-ethinyl estradiol 1/35 (NECON 1/35, 28,) tablet Take 1 tablet by mouth daily. 06/18/14   Willodean Rosenthal, MD  propranolol ER (INDERAL LA) 60 MG 24 hr capsule TAKE 1 CAPSULE BY MOUTH ONCE DAILY 05/29/15   York Spaniel, MD  venlafaxine XR (EFFEXOR XR) 75 MG 24 hr capsule Take 1 capsule (75 mg total) by mouth daily with breakfast. 02/04/15   Eustaquio Boyden, MD   BP 98/76 mmHg  Pulse 88  Temp(Src) 99 F (37.2 C) (Oral)  Resp 20  Ht 5'  8.5" (1.74 m)  Wt 63.504 kg  BMI 20.98 kg/m2  SpO2 97% Physical Exam  Constitutional: She appears well-developed and well-nourished. No distress.  HENT:  Head: Normocephalic and atraumatic.  +trismus, muffled voice.  Unable to visualize pharynx clearly secondary to swelling and trismus.    Neck: Neck supple.  Cardiovascular: Normal rate and regular rhythm.   Pulmonary/Chest: Effort normal and breath sounds normal. No respiratory distress. She has no wheezes. She has no rales.  Abdominal: Soft. She exhibits no distension. There is no tenderness. There is no rebound and no guarding.  Lymphadenopathy:    She has cervical adenopathy (posterior cervical lymphadenopathy ).   Neurological: She is alert.  Skin: She is not diaphoretic.  Nursing note and vitals reviewed.   ED Course  Procedures (including critical care time) Labs Review Labs Reviewed  CBC WITH DIFFERENTIAL/PLATELET - Abnormal; Notable for the following:    Lymphs Abs 4.1 (*)    Monocytes Absolute 1.1 (*)    All other components within normal limits  BASIC METABOLIC PANEL    Imaging Review No results found. I have personally reviewed and evaluated these images and lab results as part of my medical decision-making.   EKG Interpretation None      MDM   Final diagnoses:  Pharyngeal edema  Mononucleosis    Afebrile nontoxic patient with recent mono diagnosis, symptoms x 2 weeks, increased difficulty with swallowing.  Treated yesterday with Bicillin. Sent from urgent care for IVF, steroids.  Per chart pt was discussed with Dr Jenne Pane.  I discussed pt and workup with Dr Freida Busman.  CBC, BMP, CT neck w contrast, IVF, pain/nausea/steroid medications ordered.  Pain initially improved.  Labs unremarkable.  CT neck pending at change of shift.  Signed out to Earley Favor, NP, pending CT and reevaluation following medications.     Trixie Dredge, PA-C 05/30/15 2005  Lorre Nick, MD 05/30/15 2019  Trixie Dredge, PA-C 05/31/15 1451  Lorre Nick, MD 06/02/15 2215

## 2015-05-31 ENCOUNTER — Encounter (HOSPITAL_COMMUNITY): Payer: Self-pay | Admitting: Family Medicine

## 2015-05-31 DIAGNOSIS — J029 Acute pharyngitis, unspecified: Principal | ICD-10-CM

## 2015-05-31 LAB — CBC
HCT: 34.7 % — ABNORMAL LOW (ref 36.0–46.0)
HEMOGLOBIN: 12.1 g/dL (ref 12.0–15.0)
MCH: 32.4 pg (ref 26.0–34.0)
MCHC: 34.9 g/dL (ref 30.0–36.0)
MCV: 93 fL (ref 78.0–100.0)
Platelets: 254 10*3/uL (ref 150–400)
RBC: 3.73 MIL/uL — AB (ref 3.87–5.11)
RDW: 12.1 % (ref 11.5–15.5)
WBC: 7.7 10*3/uL (ref 4.0–10.5)

## 2015-05-31 LAB — BASIC METABOLIC PANEL
ANION GAP: 13 (ref 5–15)
BUN: 13 mg/dL (ref 6–20)
CALCIUM: 8.9 mg/dL (ref 8.9–10.3)
CO2: 21 mmol/L — ABNORMAL LOW (ref 22–32)
Chloride: 104 mmol/L (ref 101–111)
Creatinine, Ser: 0.74 mg/dL (ref 0.44–1.00)
Glucose, Bld: 132 mg/dL — ABNORMAL HIGH (ref 65–99)
POTASSIUM: 4.1 mmol/L (ref 3.5–5.1)
SODIUM: 138 mmol/L (ref 135–145)

## 2015-05-31 LAB — PREGNANCY, URINE: Preg Test, Ur: NEGATIVE

## 2015-05-31 LAB — HCG, QUANTITATIVE, PREGNANCY: hCG, Beta Chain, Quant, S: <1 m[IU]/mL (ref <5)

## 2015-05-31 MED ORDER — ACETAMINOPHEN 325 MG PO TABS
650.0000 mg | ORAL_TABLET | Freq: Four times a day (QID) | ORAL | Status: DC | PRN
  Administered 2015-05-31 – 2015-06-01 (×2): 650 mg via ORAL
  Filled 2015-05-31 (×2): qty 2

## 2015-05-31 MED ORDER — KETOROLAC TROMETHAMINE 30 MG/ML IJ SOLN
30.0000 mg | Freq: Once | INTRAMUSCULAR | Status: AC
  Administered 2015-05-31: 30 mg via INTRAVENOUS
  Filled 2015-05-31: qty 1

## 2015-05-31 MED ORDER — PANTOPRAZOLE SODIUM 40 MG PO TBEC
40.0000 mg | DELAYED_RELEASE_TABLET | Freq: Every day | ORAL | Status: DC
  Administered 2015-05-31: 40 mg via ORAL
  Filled 2015-05-31: qty 1

## 2015-05-31 NOTE — Care Management Note (Signed)
Case Management Note  Patient Details  Name: Samantha Norman MRN: [id number] Date of Birth: 09/05/1985  Subjective/Objective:                    Action/Plan:  Initial UR completed  Expected Discharge Date:                  Expected Discharge Plan:  Home/Self Care  In-House Referral:     Discharge planning Services     Post Acute Care Choice:    Choice offered to:     DME Arranged:    DME Agency:     HH Arranged:    HH Agency:     Status of Service:  In process, will continue to follow  Medicare Important Message Given:    Date Medicare IM Given:    Medicare IM give by:    Date Additional Medicare IM Given:    Additional Medicare Important Message give by:     If discussed at Long Length of Stay Meetings, dates discussed:    Additional Comments:  [name], [name], RN 05/31/2015, 8:50 AM

## 2015-05-31 NOTE — Progress Notes (Addendum)
PATIENT DETAILS Name: Samantha NissenStacey A Norman Age: 30 y.o. Sex: female Date of Birth: 10/26/1985 Admit Date: 05/30/2015 Admitting Physician Gery Prayebby Crosley, MD [id number]:Javier Sharen HonesGutierrez, MD  Subjective: Much improved this morning-no drooling, significantly less neck pain, able to swallow more easily this morning and tolerating clear liquids.  Assessment/Plan: Left sided tonsillitis with retropharyngeal inflammation: Seems to have rapidly improved with IV clindamycin and Decadron. She is tolerating clear liquids, no longer drooling, significantly less pain during swallowing. Advance to full liquids, continue empiric antibiotics. Suspect if improvement continues, she should be ready for discharge in the next 1-2 days. Rapid strep on 3/1 was negative. Note, ED MD discussed case with ENT on admission. CT neck on admission did not show any abscess. Will screen for HIV. Urine pregnancy-not done in the ED-will check a complete workup.  Recent history of infectious mononucleosis  Disposition: Remain inpatient-home over the weekend.  Antimicrobial agents  See below  Anti-infectives    Start     Dose/Rate Route Frequency Ordered Stop   05/31/15 0400  clindamycin (CLEOCIN) IVPB 900 mg     900 mg 100 mL/hr over 30 Minutes Intravenous 3 times per day 05/30/15 2231     05/30/15 2015  clindamycin (CLEOCIN) IVPB 600 mg     600 mg 100 mL/hr over 30 Minutes Intravenous  Once 05/30/15 2014 05/30/15 2059      DVT Prophylaxis: Prophylactic Lovenox   Code Status: Full code   Family Communication Fiance at bedside-spoke only after patient's permission.  Procedures: None  CONSULTS:  None  Time spent 25 minutes-Greater than 50% of this time was spent in counseling, explanation of diagnosis, planning of further management, and coordination of care.  MEDICATIONS: Scheduled Meds: . clindamycin (CLEOCIN) IV  900 mg Intravenous 3 times per day  . dexamethasone  6 mg Intravenous 3 times per  day   Followed by  . [START ON 06/01/2015] dexamethasone  4 mg Intravenous 3 times per day  . enoxaparin (LOVENOX) injection  40 mg Subcutaneous Q24H  . norethindrone-ethinyl estradiol 1/35  1 tablet Oral Daily  . pantoprazole  40 mg Oral Q1200  . propranolol ER  60 mg Oral Daily  . venlafaxine XR  75 mg Oral Q breakfast   Continuous Infusions:  PRN Meds:.acetaminophen, polyethylene glycol, promethazine    PHYSICAL EXAM: Vital signs in last 24 hours: Filed Vitals:   05/30/15 2145 05/30/15 2230 05/31/15 0455 05/31/15 1244  BP: 110/73 102/91 100/61 111/72  Pulse: 94 75 81 72  Temp:  98.2 F (36.8 C) 98.5 F (36.9 C) 98.5 F (36.9 C)  TempSrc:  Oral Oral Oral  Resp: 16 18 17 17   Height:  5\' 8"  (1.727 m)    Weight:  67.495 kg (148 lb 12.8 oz)    SpO2: 99% 98% 98% 100%    Weight change:  Filed Weights   05/30/15 1712 05/30/15 2230  Weight: 63.504 kg (140 lb) 67.495 kg (148 lb 12.8 oz)   Body mass index is 22.63 kg/(m^2).   Gen Exam: Awake and alert with clear speech. Lying comfortably in bed. No stridor. Easily able to swallow saliva. Has mild trismus Neck: Supple, No JVD.   Chest: B/L Clear.  No rhonchi. CVS: S1 S2 Regular, no murmurs.  Abdomen: soft, BS +, non tender, non distended.  Extremities: no edema, lower extremities warm to touch. Neurologic: Non Focal.   Skin: No Rash.   Wounds: N/A.  Intake/Output from previous day:  Intake/Output Summary (Last 24 hours) at 05/31/15 1437 Last data filed at 05/31/15 1245  Gross per 24 hour  Intake    760 ml  Output    800 ml  Net    -40 ml     LAB RESULTS: CBC  Recent Labs Lab 05/30/15 1850 05/31/15 0816  WBC 10.0 7.7  HGB 13.3 12.1  HCT 41.2 34.7*  PLT 250 254  MCV 95.2 93.0  MCH 30.7 32.4  MCHC 32.3 34.9  RDW 12.5 12.1  LYMPHSABS 4.1*  --   MONOABS 1.1*  --   EOSABS 0.0  --   BASOSABS 0.1  --     Chemistries   Recent Labs Lab 05/30/15 1850 05/31/15 0816  NA 141 138  K 4.4 4.1  CL 106 104    CO2 23 21*  GLUCOSE 87 132*  BUN 17 13  CREATININE 0.99 0.74  CALCIUM 9.5 8.9    CBG: No results for input(s): GLUCAP in the last 168 hours.  GFR Estimated Creatinine Clearance: 105.6 mL/min (by C-G formula based on Cr of 0.74).  Coagulation profile No results for input(s): INR, PROTIME in the last 168 hours.  Cardiac Enzymes No results for input(s): CKMB, TROPONINI, MYOGLOBIN in the last 168 hours.  Invalid input(s): CK  Invalid input(s): POCBNP No results for input(s): DDIMER in the last 72 hours. No results for input(s): HGBA1C in the last 72 hours. No results for input(s): CHOL, HDL, LDLCALC, TRIG, CHOLHDL, LDLDIRECT in the last 72 hours. No results for input(s): TSH, T4TOTAL, T3FREE, THYROIDAB in the last 72 hours.  Invalid input(s): FREET3 No results for input(s): VITAMINB12, FOLATE, FERRITIN, TIBC, IRON, RETICCTPCT in the last 72 hours. No results for input(s): LIPASE, AMYLASE in the last 72 hours.  Urine Studies No results for input(s): UHGB, CRYS in the last 72 hours.  Invalid input(s): UACOL, UAPR, USPG, UPH, UTP, UGL, UKET, UBIL, UNIT, UROB, ULEU, UEPI, UWBC, URBC, UBAC, CAST, UCOM, BILUA  MICROBIOLOGY: No results found for this or any previous visit (from the past 240 hour(s)).  RADIOLOGY STUDIES/RESULTS: Ct Soft Tissue Neck W Contrast  05/30/2015  CLINICAL DATA:  30 year old female with sore throat. Difficulty swallowing. Initial encounter. EXAM: CT NECK WITH CONTRAST TECHNIQUE: Multidetector CT imaging of the neck was performed using the standard protocol following the bolus administration of intravenous contrast. CONTRAST:  75mL OMNIPAQUE IOHEXOL 300 MG/ML  SOLN COMPARISON:  None. FINDINGS: Pharynx and larynx: Diffuse inflammation of the left palatine tonsil. Dental artifact limits evaluation. Within the inflamed left tonsil subtle low-density structure measuring up to 1.3 x 0.8 cm possibly represents small contained abscess although difficult to discern with  any certainty. Extension of inflammatory process across midline to the right. Extension into the left parapharyngeal space with hazy infiltration of fat planes (bordering the left muscles of mastication). Small amount of retropharyngeal fluid. No deep drainable abscess currently noted. Narrowing of the air column greater on the left. Inflammation extends to the level of the left piriform sinus which is slightly narrowed. Salivary glands: No primary salivary gland abnormality. Thyroid: Right lower thyroid gland 6 mm nodule. Lymph nodes: Diffuse bilateral adenopathy greatest level 2 region. Vascular: No evidence of thrombosis of the internal jugular veins. Limited intracranial: Negative. Visualized orbits: Negative. Mastoids and visualized paranasal sinuses: Clear. Skeleton: No acute abnormality. Upper chest: Negative. IMPRESSION: Diffuse left-sided tonsillitis with spread of inflammatory process into the left parapharyngeal space (bordering the left muscles of mastication) with small amount of  fluid in the retropharyngeal region without deep drainable abscess. Small abscesses within the inflamed left tonsil may be present although dental artifact limits evaluation. Narrowing of the air column greater on the left. Narrowing of left piriform sinus. Please see above. Diffuse bilateral adenopathy most notable level 2 region. These results were called by telephone at the time of interpretation on 05/30/2015 at 8:00 pm to Sharen Hones NP who verbally acknowledged these results. Electronically Signed   By: Lacy Duverney M.D.   On: 05/30/2015 20:15    Jeoffrey Massed, MD  [facility] Pager:[phone]  If 7PM-7AM, please contact night-coverage [email or web address] Password [email or web address] 05/31/2015, 2:37 PM   LOS: 1 day

## 2015-06-01 DIAGNOSIS — K122 Cellulitis and abscess of mouth: Secondary | ICD-10-CM

## 2015-06-01 LAB — HIV ANTIBODY (ROUTINE TESTING W REFLEX): HIV SCREEN 4TH GENERATION: NONREACTIVE

## 2015-06-01 MED ORDER — CLINDAMYCIN HCL 300 MG PO CAPS: 600.0000 mg | ORAL_CAPSULE | Freq: Three times a day (TID) | ORAL | Status: DC

## 2015-06-01 MED ORDER — METHYLPREDNISOLONE 4 MG PO TBPK: ORAL_TABLET | ORAL | Status: DC

## 2015-06-01 MED ORDER — ACIDOPHILUS PROBIOTIC 100 MG PO CAPS: ORAL_CAPSULE | ORAL | Status: DC

## 2015-06-01 MED ORDER — CLINDAMYCIN HCL 300 MG PO CAPS
600.0000 mg | ORAL_CAPSULE | Freq: Three times a day (TID) | ORAL | Status: DC
Start: 2015-06-01 — End: 2015-06-07

## 2015-06-01 NOTE — Discharge Summary (Signed)
Samantha Norman, is a 30 y.o. female  DOB 09/22/1985  MRN [id number].  Admission date:  05/30/2015  Admitting Physician  Gery Prayebby Crosley, MD  Discharge Date:  06/01/2015   Primary MD  Eustaquio BoydenJavier Gutierrez, MD  Recommendations for primary care physician for things to follow:   Reevaluate within 3-4 days, check CBC BMP next visit. If tonsillitis not improved outpatient ENT evaluation.   Admission Diagnosis  Mononucleosis [B27.90] Pharyngeal edema [J39.2] Tonsillitis [J03.90]   Discharge Diagnosis  Mononucleosis [B27.90] Pharyngeal edema [J39.2] Tonsillitis [J03.90]    Active Problems:   Ludwig's angina      Past Medical History  Diagnosis Date  . Dizziness     "goes along w/my migraines"  . Spondylolysis of lumbosacral region 2004    chronic R L5  . History of chicken pox   . Mononucleosis dx'd 05/2015  . 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 Lumbar vertebral fracture (HCC) 2004    "sports related; L5; no OR"  . Family history of adverse reaction to anesthesia     "Mom is overweight, has sleep apnea; has breathing issues w/anesthesia"  . Pneumonia ~ 2010  . Migraine headache     "monthly even w/my RX" (05/31/2015)  . Depression     Past Surgical History  Procedure Laterality Date  . Evoked brainstem auditory response  2014    WNL ([facility])  . Brain mri  11/2013    WNL, no demyelinating disease       HPI  from the history and physical done on the day of admission:   This is a 30 year old female who was diagnosed with mono 2 weeks ago. She did well initially but over the past week she has had increasing difficulty with swallowing. She now has difficulty swallowing liquids and solids. Swallowing is painful and she states she is unable to get the food passed. Pain is worse on the left. She went to urgent care last night,  she was prescribed Bicillin. Her symptoms have not improved and she came to ER tonight.      Hospital Course:     1.Left-sided tonsillitis with retropharyngeal inflammation. She was placed on IV clindamycin and Decadron with remarkable improvement, she is now very close to baseline, says she is about 80-90% better, tolerating soft diet, will be placed on clindamycin and Medrol Dosepak and discharged home, request PCP to check clinically within 3-4 days. If not better outpatient ENT follow-up. HIV screen, rapid strep throat, urine pregnancy negative.   Discharge Condition: Stable  Follow UP  Follow-up Information    Follow up with Eustaquio BoydenJavier Gutierrez, MD. Schedule an appointment as soon as possible for a visit in 1 week.   Specialty:  Family Medicine   Contact information:   [address] [place] [id number] [phone]        Consults obtained -  none  Diet and Activity recommendation: See Discharge Instructions below  Discharge Instructions       Discharge Instructions    Discharge instructions  Complete by:  As directed   Follow with Primary MD Eustaquio Boyden, MD in 7 days   Get CBC, CMP, 2 view Chest X ray checked  by Primary MD next visit.    Activity: As tolerated with Full fall precautions use walker/cane & assistance as needed   Disposition Home     Diet:   Soft diet with feeding assistance and aspiration precautions.  For Heart failure patients - Check your Weight same time everyday, if you gain over 2 pounds, or you develop in leg swelling, experience more shortness of breath or chest pain, call your Primary MD immediately. Follow Cardiac Low Salt Diet and 1.5 lit/day fluid restriction.   On your next visit with your primary care physician please Get Medicines reviewed and adjusted.   Please request your Prim.MD to go over all Hospital Tests and Procedure/Radiological results at the follow up, please get all Hospital records sent to your Prim  MD by signing hospital release before you go home.   If you experience worsening of your admission symptoms, develop shortness of breath, life threatening emergency, suicidal or homicidal thoughts you must seek medical attention immediately by calling 911 or calling your MD immediately  if symptoms less severe.  You Must read complete instructions/literature along with all the possible adverse reactions/side effects for all the Medicines you take and that have been prescribed to you. Take any new Medicines after you have completely understood and accpet all the possible adverse reactions/side effects.   Do not drive, operating heavy machinery, perform activities at heights, swimming or participation in water activities or provide baby sitting services if your were admitted for syncope or siezures until you have seen by Primary MD or a Neurologist and advised to do so again.  Do not drive when taking Pain medications.    Do not take more than prescribed Pain, Sleep and Anxiety Medications  Special Instructions: If you have smoked or chewed Tobacco  in the last 2 yrs please stop smoking, stop any regular Alcohol  and or any Recreational drug use.  Wear Seat belts while driving.   Please note  You were cared for by a hospitalist during your hospital stay. If you have any questions about your discharge medications or the care you received while you were in the hospital after you are discharged, you can call the unit and asked to speak with the hospitalist on call if the hospitalist that took care of you is not available. Once you are discharged, your primary care physician will handle any further medical issues. Please note that NO REFILLS for any discharge medications will be authorized once you are discharged, as it is imperative that you return to your primary care physician (or establish a relationship with a primary care physician if you do not have one) for your aftercare needs so that they  can reassess your need for medications and monitor your lab values.     Increase activity slowly    Complete by:  As directed              Discharge Medications       Medication List    TAKE these medications        ACIDOPHILUS PROBIOTIC 100 MG Caps  Any brand probiotic OK, 1 PO BID     clindamycin 300 MG capsule  Commonly known as:  CLEOCIN  Take 2 capsules (600 mg total) by mouth 3 (three) times daily.     methylPREDNISolone 4 MG Tbpk  tablet  Commonly known as:  MEDROL DOSEPAK  follow package directions     norethindrone-ethinyl estradiol 1/35 tablet  Commonly known as:  NECON 1/35 (28)  Take 1 tablet by mouth daily.     propranolol ER 60 MG 24 hr capsule  Commonly known as:  INDERAL LA  TAKE 1 CAPSULE BY MOUTH ONCE DAILY     venlafaxine XR 75 MG 24 hr capsule  Commonly known as:  EFFEXOR XR  Take 1 capsule (75 mg total) by mouth daily with breakfast.        Major procedures and Radiology Reports - PLEASE review detailed and final reports for all details, in brief -       Ct Soft Tissue Neck W Contrast  05/30/2015  CLINICAL DATA:  30 year old female with sore throat. Difficulty swallowing. Initial encounter. EXAM: CT NECK WITH CONTRAST TECHNIQUE: Multidetector CT imaging of the neck was performed using the standard protocol following the bolus administration of intravenous contrast. CONTRAST:  75mL OMNIPAQUE IOHEXOL 300 MG/ML  SOLN COMPARISON:  None. FINDINGS: Pharynx and larynx: Diffuse inflammation of the left palatine tonsil. Dental artifact limits evaluation. Within the inflamed left tonsil subtle low-density structure measuring up to 1.3 x 0.8 cm possibly represents small contained abscess although difficult to discern with any certainty. Extension of inflammatory process across midline to the right. Extension into the left parapharyngeal space with hazy infiltration of fat planes (bordering the left muscles of mastication). Small amount of retropharyngeal  fluid. No deep drainable abscess currently noted. Narrowing of the air column greater on the left. Inflammation extends to the level of the left piriform sinus which is slightly narrowed. Salivary glands: No primary salivary gland abnormality. Thyroid: Right lower thyroid gland 6 mm nodule. Lymph nodes: Diffuse bilateral adenopathy greatest level 2 region. Vascular: No evidence of thrombosis of the internal jugular veins. Limited intracranial: Negative. Visualized orbits: Negative. Mastoids and visualized paranasal sinuses: Clear. Skeleton: No acute abnormality. Upper chest: Negative. IMPRESSION: Diffuse left-sided tonsillitis with spread of inflammatory process into the left parapharyngeal space (bordering the left muscles of mastication) with small amount of fluid in the retropharyngeal region without deep drainable abscess. Small abscesses within the inflamed left tonsil may be present although dental artifact limits evaluation. Narrowing of the air column greater on the left. Narrowing of left piriform sinus. Please see above. Diffuse bilateral adenopathy most notable level 2 region. These results were called by telephone at the time of interpretation on 05/30/2015 at 8:00 pm to Sharen Hones NP who verbally acknowledged these results. Electronically Signed   By: Lacy Duverney M.D.   On: 05/30/2015 20:15    Micro Results      No results found for this or any previous visit (from the past 240 hour(s)).     Today   Subjective    Samantha Norman today has no headache,no chest abdominal pain,no new weakness tingling or numbness, feels much better wants to go home today.     Objective   Blood pressure 110/67, pulse 82, temperature 97.9 F (36.6 C), temperature source Oral, resp. rate 18, height  (1.727 m), weight 67.495 kg (148 lb 12.8 oz), SpO2 99 %.   Intake/Output Summary (Last 24 hours) at 06/01/15 0831 Last data filed at 06/01/15 0818  Gross per 24 hour  Intake    540 ml  Output   1400  ml  Net   -860 ml    Exam Awake Alert, Oriented x 3, No new F.N deficits, Normal affect  [place].AT,PERRAL Supple Neck,No JVD, No cervical lymphadenopathy appriciated.  Symmetrical Chest wall movement, Good air movement bilaterally, CTAB RRR,No Gallops,Rubs or new Murmurs, No Parasternal Heave +ve B.Sounds, Abd Soft, Non tender, No organomegaly appriciated, No rebound -guarding or rigidity. No Cyanosis, Clubbing or edema, No new Rash or bruise   Data Review   CBC w Diff: Lab Results  Component Value Date   WBC 7.7 05/31/2015   HGB 12.1 05/31/2015   HGB 15.0 12/10/2011   HCT 34.7* 05/31/2015   PLT 254 05/31/2015   LYMPHOPCT 41 05/30/2015   MONOPCT 11 05/30/2015   EOSPCT 0 05/30/2015   BASOPCT 1 05/30/2015    CMP: Lab Results  Component Value Date   NA 138 05/31/2015   K 4.1 05/31/2015   CL 104 05/31/2015   CO2 21* 05/31/2015   BUN 13 05/31/2015   CREATININE 0.74 05/31/2015   CREATININE 0.88 05/17/2015   PROT 6.9 05/17/2015   ALBUMIN 4.2 05/17/2015   BILITOT 1.1 05/17/2015   ALKPHOS 35 05/17/2015   AST 30 05/17/2015   ALT 26 05/17/2015  .   Total Time in preparing paper work, data evaluation and todays exam - 35 minutes  Leroy Sea M.D on 06/01/2015 at 8:31 AM  [facility]   Office  [phone]

## 2015-06-01 NOTE — Progress Notes (Signed)
Pt ready for discharge to home accomp by family.  rx given and reviewed.  Copy DC instructions given and explained.

## 2015-06-01 NOTE — Discharge Instructions (Signed)
Follow with Primary MD Eustaquio BoydenJavier Gutierrez, MD in 7 days   Get CBC, CMP, 2 view Chest X ray checked  by Primary MD next visit.    Activity: As tolerated with Full fall precautions use walker/cane & assistance as needed   Disposition Home     Diet:   Soft diet with feeding assistance and aspiration precautions.  For Heart failure patients - Check your Weight same time everyday, if you gain over 2 pounds, or you develop in leg swelling, experience more shortness of breath or chest pain, call your Primary MD immediately. Follow Cardiac Low Salt Diet and 1.5 lit/day fluid restriction.   On your next visit with your primary care physician please Get Medicines reviewed and adjusted.   Please request your Prim.MD to go over all Hospital Tests and Procedure/Radiological results at the follow up, please get all Hospital records sent to your Prim MD by signing hospital release before you go home.   If you experience worsening of your admission symptoms, develop shortness of breath, life threatening emergency, suicidal or homicidal thoughts you must seek medical attention immediately by calling 911 or calling your MD immediately  if symptoms less severe.  You Must read complete instructions/literature along with all the possible adverse reactions/side effects for all the Medicines you take and that have been prescribed to you. Take any new Medicines after you have completely understood and accpet all the possible adverse reactions/side effects.   Do not drive, operating heavy machinery, perform activities at heights, swimming or participation in water activities or provide baby sitting services if your were admitted for syncope or siezures until you have seen by Primary MD or a Neurologist and advised to do so again.  Do not drive when taking Pain medications.    Do not take more than prescribed Pain, Sleep and Anxiety Medications  Special Instructions: If you have smoked or chewed Tobacco  in the  last 2 yrs please stop smoking, stop any regular Alcohol  and or any Recreational drug use.  Wear Seat belts while driving.   Please note  You were cared for by a hospitalist during your hospital stay. If you have any questions about your discharge medications or the care you received while you were in the hospital after you are discharged, you can call the unit and asked to speak with the hospitalist on call if the hospitalist that took care of you is not available. Once you are discharged, your primary care physician will handle any further medical issues. Please note that NO REFILLS for any discharge medications will be authorized once you are discharged, as it is imperative that you return to your primary care physician (or establish a relationship with a primary care physician if you do not have one) for your aftercare needs so that they can reassess your need for medications and monitor your lab values.

## 2015-06-03 ENCOUNTER — Telehealth: Payer: Self-pay | Admitting: *Deleted

## 2015-06-03 NOTE — Telephone Encounter (Signed)
Transition Care Management Follow-up Telephone Call   Date discharged? 06/01/15   How have you been since you were released from the hospital? Doing well, much improved.   Do you understand why you were in the hospital? yes   Do you understand the discharge instructions? yes   Where were you discharged to? home   Items Reviewed:  Medications reviewed: yes  Allergies reviewed: yes  Dietary changes reviewed: no  Referrals reviewed: yes, ENT if needed - will evaluate and determine need at 3/10 f/u   Functional Questionnaire:   Activities of Daily Living (ADLs):   She states they are independent in the following: ambulation, bathing and hygiene, feeding, continence, grooming, toileting and dressing States they require assistance with the following: none   Any transportation issues/concerns?: no   Any patient concerns? no   Confirmed importance and date/time of follow-up visits scheduled yes, 06/07/15 @ 0800  Provider Appointment booked with Eustaquio BoydenJavier Gutierrez, MD  Confirmed with patient if condition begins to worsen call PCP or go to the ER.  Patient was given the office number and encouraged to call back with question or concerns.  : yes

## 2015-06-05 ENCOUNTER — Other Ambulatory Visit: Payer: Self-pay | Admitting: *Deleted

## 2015-06-05 DIAGNOSIS — Z01419 Encounter for gynecological examination (general) (routine) without abnormal findings: Secondary | ICD-10-CM

## 2015-06-05 MED ORDER — NORETHINDRONE-ETH ESTRADIOL 1-35 MG-MCG PO TABS: 1.0000 | ORAL_TABLET | Freq: Every day | ORAL | Status: DC

## 2015-06-05 MED FILL — DASETTA 1-35-28 TABLET: 1-35 | 84 days supply | Qty: 84 | Fill #0

## 2015-06-05 NOTE — Telephone Encounter (Signed)
Received a fax for a refill of birth contol. Has been seen 05/2014- approve 3 refills and put note needs to see provider to get further refills.

## 2015-06-07 ENCOUNTER — Ambulatory Visit (INDEPENDENT_AMBULATORY_CARE_PROVIDER_SITE_OTHER): Payer: 59 | Admitting: Family Medicine

## 2015-06-07 ENCOUNTER — Encounter: Payer: Self-pay | Admitting: Family Medicine

## 2015-06-07 VITALS — BP 110/76 | HR 80 | Temp 97.8°F | Wt 145.8 lb

## 2015-06-07 DIAGNOSIS — K122 Cellulitis and abscess of mouth: Secondary | ICD-10-CM

## 2015-06-07 LAB — CBC WITH DIFFERENTIAL/PLATELET
BASOS PCT: 0.6 % (ref 0.0–3.0)
Basophils Absolute: 0 10*3/uL (ref 0.0–0.1)
EOS ABS: 0.2 10*3/uL (ref 0.0–0.7)
Eosinophils Relative: 2.9 % (ref 0.0–5.0)
HCT: 39.9 % (ref 36.0–46.0)
HEMOGLOBIN: 13.6 g/dL (ref 12.0–15.0)
Lymphocytes Relative: 58.8 % — ABNORMAL HIGH (ref 12.0–46.0)
Lymphs Abs: 3.3 10*3/uL (ref 0.7–4.0)
MCHC: 34.1 g/dL (ref 30.0–36.0)
MCV: 92.6 fl (ref 78.0–100.0)
MONO ABS: 0.6 10*3/uL (ref 0.1–1.0)
Monocytes Relative: 11.5 % (ref 3.0–12.0)
Neutro Abs: 1.5 10*3/uL (ref 1.4–7.7)
Neutrophils Relative %: 26.2 % — ABNORMAL LOW (ref 43.0–77.0)
Platelets: 281 10*3/uL (ref 150.0–400.0)
RBC: 4.31 Mil/uL (ref 3.87–5.11)
RDW: 12.6 % (ref 11.5–15.5)
WBC: 5.6 10*3/uL (ref 4.0–10.5)

## 2015-06-07 NOTE — Patient Instructions (Addendum)
labwork today.  You are doing well today.  Return as needed.  Remind us to check CBC next physical as well

## 2015-06-07 NOTE — Assessment & Plan Note (Addendum)
Has completed treatment with steroid and clindamycin. Continues probiotic. Almost back to baseline except for mild scratchy throat. Discussed anticipate continued resolution. Check CBC today. Check CBC next physical.

## 2015-06-07 NOTE — Progress Notes (Signed)
BP 110/76 mmHg  Pulse 80  Temp(Src) 97.8 F (36.6 C) (Oral)  Wt 145 lb 12 oz (66.112 kg)   CC: f/u hospitalization  Subjective:    Patient ID: Samantha Norman, female    DOB: October 03, 1985, 30 y.o.   MRN: [id number]  HPI: Samantha Norman is a 30 y.o. female presenting on 06/07/2015 for Follow-up   Recent hospitalization for ludwig's angina. Records reviewed. Mononucleosis complicated by ludwig's angina. Did not improve with bicillin, hospitalized and treated with IV clindamycin and decadron with significant improvement. Discharged on oral clindamycin and medrol dose pack.  Initial monospot negative - but only 2 d into illness. EBV IgM, IgG were positive at work.   Finished clinda and steroid. Taking probiotic.   Admission date: 05/30/2015 Discharge Date: 06/01/2015  Hospital f/u phone call: 06/03/2015  Recommendations for primary care physician for things to follow:  Reevaluate within 3-4 days, check CBC BMP next visit. If tonsillitis not improved outpatient ENT evaluation.  Admission Diagnosis Mononucleosis [B27.90] Pharyngeal edema [J39.2] Tonsillitis [J03.90]  Discharge Diagnosis Mononucleosis [B27.90] Pharyngeal edema [J39.2] Tonsillitis [J03.90]   Active Problems:  Ludwig's angina  Relevant past medical, surgical, family and social history reviewed and updated as indicated. Interim medical history since our last visit reviewed. Allergies and medications reviewed and updated. Current Outpatient Prescriptions on File Prior to Visit  Medication Sig  . norethindrone-ethinyl estradiol 1/35 (NECON 1/35, 28,) tablet Take 1 tablet by mouth daily.  . propranolol ER (INDERAL LA) 60 MG 24 hr capsule TAKE 1 CAPSULE BY MOUTH ONCE DAILY  . venlafaxine XR (EFFEXOR XR) 75 MG 24 hr capsule Take 1 capsule (75 mg total) by mouth daily with breakfast.   No current facility-administered medications on file prior to visit.    Review of Systems Per HPI unless specifically indicated in ROS  section     Objective:    BP 110/76 mmHg  Pulse 80  Temp(Src) 97.8 F (36.6 C) (Oral)  Wt 145 lb 12 oz (66.112 kg)  Wt Readings from Last 3 Encounters:  06/07/15 145 lb 12 oz (66.112 kg)  05/30/15 148 lb 12.8 oz (67.495 kg)  05/20/15 151 lb (68.493 kg)    Physical Exam  Constitutional: She appears well-developed and well-nourished. No distress.  HENT:  Head: Normocephalic and atraumatic.  Right Ear: Hearing, tympanic membrane, external ear and ear canal normal.  Left Ear: Hearing, tympanic membrane, external ear and ear canal normal.  Nose: Nose normal. No mucosal edema or rhinorrhea.  Mouth/Throat: Uvula is midline, oropharynx is clear and moist and mucous membranes are normal. No oropharyngeal exudate, posterior oropharyngeal edema, posterior oropharyngeal erythema or tonsillar abscesses.  S/p dental fillings but no dental disease evident today  Eyes: Conjunctivae and EOM are normal. Pupils are equal, round, and reactive to light.  Neck: Normal range of motion. Neck supple. No thyromegaly present.  Cardiovascular: Normal rate, regular rhythm, normal heart sounds and intact distal pulses.   No murmur heard. Pulmonary/Chest: Effort normal and breath sounds normal. No respiratory distress. She has no wheezes. She has no rales.  Musculoskeletal: She exhibits no edema.  Lymphadenopathy:       Head (right side): Occipital (resolving) adenopathy present. No submental, no submandibular, no tonsillar, no preauricular and no posterior auricular adenopathy present.       Head (left side): No submental, no submandibular, no tonsillar, no preauricular, no posterior auricular and no occipital adenopathy present.    She has no cervical adenopathy.  Right: No supraclavicular adenopathy present.       Left: No supraclavicular adenopathy present.  Nursing note and vitals reviewed.  Results for orders placed or performed during the hospital encounter of 05/30/15  Basic metabolic panel    Result Value Ref Range   Sodium 141 135 - 145 mmol/L   Potassium 4.4 3.5 - 5.1 mmol/L   Chloride 106 101 - 111 mmol/L   CO2 23 22 - 32 mmol/L   Glucose, Bld 87 65 - 99 mg/dL   BUN 17 6 - 20 mg/dL   Creatinine, Ser 2.44 0.44 - 1.00 mg/dL   Calcium 9.5 8.9 - 01.0 mg/dL   GFR calc non Af Amer >60 >60 mL/min   GFR calc Af Amer >60 >60 mL/min   Anion gap 12 5 - 15  CBC with Differential  Result Value Ref Range   WBC 10.0 4.0 - 10.5 K/uL   RBC 4.33 3.87 - 5.11 MIL/uL   Hemoglobin 13.3 12.0 - 15.0 g/dL   HCT 27.2 53.6 - 64.4 %   MCV 95.2 78.0 - 100.0 fL   MCH 30.7 26.0 - 34.0 pg   MCHC 32.3 30.0 - 36.0 g/dL   RDW 03.4 74.2 - 59.5 %   Platelets 250 150 - 400 K/uL   Neutrophils Relative % 47 %   Neutro Abs 4.7 1.7 - 7.7 K/uL   Lymphocytes Relative 41 %   Lymphs Abs 4.1 (H) 0.7 - 4.0 K/uL   Monocytes Relative 11 %   Monocytes Absolute 1.1 (H) 0.1 - 1.0 K/uL   Eosinophils Relative 0 %   Eosinophils Absolute 0.0 0.0 - 0.7 K/uL   Basophils Relative 1 %   Basophils Absolute 0.1 0.0 - 0.1 K/uL  Basic metabolic panel  Result Value Ref Range   Sodium 138 135 - 145 mmol/L   Potassium 4.1 3.5 - 5.1 mmol/L   Chloride 104 101 - 111 mmol/L   CO2 21 (L) 22 - 32 mmol/L   Glucose, Bld 132 (H) 65 - 99 mg/dL   BUN 13 6 - 20 mg/dL   Creatinine, Ser 6.38 0.44 - 1.00 mg/dL   Calcium 8.9 8.9 - 75.6 mg/dL   GFR calc non Af Amer >60 >60 mL/min   GFR calc Af Amer >60 >60 mL/min   Anion gap 13 5 - 15  CBC  Result Value Ref Range   WBC 7.7 4.0 - 10.5 K/uL   RBC 3.73 (L) 3.87 - 5.11 MIL/uL   Hemoglobin 12.1 12.0 - 15.0 g/dL   HCT 43.3 (L) 29.5 - 18.8 %   MCV 93.0 78.0 - 100.0 fL   MCH 32.4 26.0 - 34.0 pg   MCHC 34.9 30.0 - 36.0 g/dL   RDW 41.6 60.6 - 30.1 %   Platelets 254 150 - 400 K/uL  HIV antibody  Result Value Ref Range   HIV Screen 4th Generation wRfx Non Reactive Non Reactive  hCG, quantitative, pregnancy  Result Value Ref Range   hCG, Beta Chain, Quant, S <1 <5 mIU/mL  Pregnancy,  urine  Result Value Ref Range   Preg Test, Ur NEGATIVE NEGATIVE      Assessment & Plan:   Problem List Items Addressed This Visit    Ludwig's angina - Primary    Has completed treatment with steroid and clindamycin. Continues probiotic. Almost back to baseline except for mild scratchy throat. Discussed anticipate continued resolution. Check CBC today. Check CBC next physical.       Relevant Orders  CBC with Differential/Platelet       Follow up plan: Return if symptoms worsen or fail to improve.

## 2015-06-07 NOTE — Progress Notes (Signed)
Pre visit review using our clinic review tool, if applicable. No additional management support is needed unless otherwise documented below in the visit note. 

## 2015-06-12 ENCOUNTER — Ambulatory Visit: Payer: 59 | Admitting: Family Medicine

## 2015-07-08 ENCOUNTER — Encounter: Payer: Self-pay | Admitting: Obstetrics & Gynecology

## 2015-07-08 ENCOUNTER — Ambulatory Visit (INDEPENDENT_AMBULATORY_CARE_PROVIDER_SITE_OTHER): Payer: 59 | Admitting: Obstetrics & Gynecology

## 2015-07-08 VITALS — BP 98/63 | HR 59 | Ht 68.0 in | Wt 148.0 lb

## 2015-07-08 DIAGNOSIS — Z3041 Encounter for surveillance of contraceptive pills: Secondary | ICD-10-CM | POA: Diagnosis not present

## 2015-07-08 DIAGNOSIS — Z01419 Encounter for gynecological examination (general) (routine) without abnormal findings: Secondary | ICD-10-CM

## 2015-07-08 DIAGNOSIS — Z3009 Encounter for other general counseling and advice on contraception: Secondary | ICD-10-CM

## 2015-07-08 MED ORDER — NORETHINDRONE-ETH ESTRADIOL 1-35 MG-MCG PO TABS: 1.0000 | ORAL_TABLET | Freq: Every day | ORAL | Status: DC

## 2015-07-08 NOTE — Progress Notes (Signed)
Patient ID: Samantha Norman, female   DOB: 03/29/1986, 30 y.o.   MRN: [id number] Subjective:     Samantha Norman is a 30 y.o. female here for a routine exam.  Current complaints: none.  Has pill amenorrhea but, this is not bothersome to the pt.  She is a pharmcist.    Gynecologic History No LMP recorded. Patient is not currently having periods (Reason: Oral contraceptives). Contraception: OCP (estrogen/progesterone) Last Pap: 06/18/2014 . Results were: normal Last mammogram: n/a  Obstetric History OB History  Gravida Para Term Preterm AB SAB TAB Ectopic Multiple Living  0 0 0 0 0 0 0 0 0 0          The following portions of the patient's history were reviewed and updated as appropriate: allergies, current medications, past family history, past medical history, past social history, past surgical history and problem list.  Review of Systems Pertinent items are noted in HPI.    Objective:  BP 98/63 mmHg  Pulse 59  Ht 5\' 8"  (1.727 m)  Wt 148 lb (67.132 kg)  BMI 22.51 kg/m2  General Appearance:    Alert, cooperative, no distress, appears stated age  Head:    Normocephalic, without obvious abnormality, atraumatic  Eyes:    conjunctiva/corneas clear, EOM's intact, both eyes  Ears:    Normal external ear canals, both ears  Nose:   Nares normal, septum midline, mucosa normal, no drainage    or sinus tenderness  Throat:   Lips, mucosa, and tongue normal; teeth and gums normal  Neck:   Supple, symmetrical, trachea midline, no adenopathy;    thyroid:  no enlargement/tenderness/nodules  Back:     Symmetric, no curvature, ROM normal, no CVA tenderness  Lungs:     Clear to auscultation bilaterally, respirations unlabored  Chest Wall:    No tenderness or deformity   Heart:    Regular rate and rhythm, S1 and S2 normal, no murmur, rub   or gallop  Breast Exam:    No tenderness, masses, or nipple abnormality  Abdomen:     Soft, non-tender, bowel sounds active all four quadrants,    no masses, no  organomegaly  Genitalia:    Normal female without lesion, discharge or tenderness     Extremities:   Extremities normal, atraumatic, no cyanosis or edema  Pulses:   2+ and symmetric all extremities  Skin:   Skin color, texture, turgor normal, no rashes or lesions    Assessment:    Healthy female exam.   Contraception counseling   Plan:    Follow up in: 1 year.    Refilled 35mcg OCPs No PAP done today  Rodina Pinales L. Harraway-Smith, M.D., Evern CoreFACOG

## 2015-07-08 NOTE — Patient Instructions (Signed)

## 2015-07-08 NOTE — Addendum Note (Signed)
Addended by: Faythe CasaBELLAMY, Barry Faircloth M on: 07/08/2015 04:58 PM   Modules accepted: Orders

## 2015-08-19 DIAGNOSIS — H04123 Dry eye syndrome of bilateral lacrimal glands: Secondary | ICD-10-CM | POA: Diagnosis not present

## 2015-08-19 DIAGNOSIS — H16223 Keratoconjunctivitis sicca, not specified as Sjogren's, bilateral: Secondary | ICD-10-CM | POA: Diagnosis not present

## 2015-08-27 MED FILL — VENLAFAXINE HCL ER 75 MG CA: 75 | 90 days supply | Qty: 90 | Fill #0

## 2015-08-27 MED FILL — DASETTA 1-35-28 TABLET: 1-35 | 84 days supply | Qty: 84 | Fill #0

## 2015-08-27 MED FILL — PROPRANOLOL ER 60 MG CAP: 60 | 90 days supply | Qty: 90 | Fill #1

## 2015-08-29 MED FILL — RESTASIS MULTIDOSE 0.05% EY: 0.05 | 30 days supply | Qty: 6 | Fill #0

## 2015-09-27 MED FILL — RESTASIS MULTIDOSE 0.05% EY: 0.05 | 30 days supply | Qty: 6 | Fill #1

## 2015-10-04 ENCOUNTER — Encounter: Payer: Self-pay | Admitting: Family Medicine

## 2015-10-04 ENCOUNTER — Ambulatory Visit (INDEPENDENT_AMBULATORY_CARE_PROVIDER_SITE_OTHER): Payer: 59 | Admitting: Family Medicine

## 2015-10-04 VITALS — BP 102/60 | HR 69 | Temp 98.2°F | Wt 146.8 lb

## 2015-10-04 DIAGNOSIS — R300 Dysuria: Secondary | ICD-10-CM

## 2015-10-04 DIAGNOSIS — H04123 Dry eye syndrome of bilateral lacrimal glands: Secondary | ICD-10-CM

## 2015-10-04 DIAGNOSIS — R682 Dry mouth, unspecified: Secondary | ICD-10-CM | POA: Diagnosis not present

## 2015-10-04 DIAGNOSIS — M3500 Sicca syndrome, unspecified: Secondary | ICD-10-CM

## 2015-10-04 HISTORY — DX: Sjogren syndrome, unspecified: M35.00

## 2015-10-04 LAB — POC URINALSYSI DIPSTICK (AUTOMATED)
Glucose, UA: NEGATIVE
Leukocytes, UA: NEGATIVE
Nitrite, UA: NEGATIVE
RBC UA: NEGATIVE
UROBILINOGEN UA: NEGATIVE
pH, UA: 6

## 2015-10-04 NOTE — Patient Instructions (Signed)
Try lower propranolol dose - take 20mg  IR twice daily for next few weeks and try to taper off - if migraines worsening, restart ER form. Monitor dry mouth/eye symptoms. Labs today, urinalysis today.  We will be in touch with results. Try biotene for mouth symptoms.

## 2015-10-04 NOTE — Progress Notes (Signed)
BP 102/60 mmHg  Pulse 69  Temp(Src) 98.2 F (36.8 C) (Oral)  Wt 146 lb 12 oz (66.565 kg)  SpO2 98%   CC: dry eyes and dry mouth Subjective:    Patient ID: Samantha Norman, female    DOB: 06/28/1985, 30 y.o.   MRN: [id number]  HPI: Samantha Norman is a 30 y.o. female presenting on 10/04/2015 for Dry Eye   Noticing several month h/o increased dry eye and mouth. Has been using artificial tears 9-10 times daily. Saw eye doctor - restasis has been no help. Saw dentist - advised to stop alcohol based mouth wash but hasn't helped. Has also been using hard candy and sugar free gum. She has had some peeling on roof of mouth.   Feels dehydrated despite drinking plenty of water. Some discomfort with urination. Noticing increased neuropathy of legs.   No fevers/chills, new skin rash (known dry skin eczema), joint pains, myalgias. No parotid gland swelling.  Drinks caffeine - 1 cup in am.  No EtOH.   On propranolol for last several years for migraine prevention.   H/o ludwig's angina earlier this year.   H/o positive anti-LA (07/2012) and mildly positive ANA ([facility]NA), not though sjogren.   Relevant past medical, surgical, family and social history reviewed and updated as indicated. Interim medical history since our last visit reviewed. Allergies and medications reviewed and updated. Current Outpatient Prescriptions on File Prior to Visit  Medication Sig  . norethindrone-ethinyl estradiol 1/35 (NECON 1/35, 28,) tablet Take 1 tablet by mouth daily.  Marland Kitchen. venlafaxine XR (EFFEXOR XR) 75 MG 24 hr capsule Take 1 capsule (75 mg total) by mouth daily with breakfast.   No current facility-administered medications on file prior to visit.    Review of Systems Per HPI unless specifically indicated in ROS section     Objective:    BP 102/60 mmHg  Pulse 69  Temp(Src) 98.2 F (36.8 C) (Oral)  Wt 146 lb 12 oz (66.565 kg)  SpO2 98%  Wt Readings from Last 3 Encounters:  10/04/15 146 lb 12 oz (66.565 kg)    07/08/15 148 lb (67.132 kg)  06/07/15 145 lb 12 oz (66.112 kg)    Physical Exam  Constitutional: She appears well-developed and well-nourished. No distress.  HENT:  Head: Normocephalic and atraumatic.  Mouth/Throat: Oropharynx is clear and moist. No oropharyngeal exudate.  Overall mmm No parotid swelling  Eyes: Conjunctivae and EOM are normal. Pupils are equal, round, and reactive to light. No scleral icterus.  Neck: Normal range of motion. Neck supple. No thyromegaly present.  Cardiovascular: Normal rate, regular rhythm, normal heart sounds and intact distal pulses.   No murmur heard. Pulmonary/Chest: Effort normal and breath sounds normal. No respiratory distress. She has no wheezes. She has no rales.  Musculoskeletal: She exhibits no edema.  Lymphadenopathy:    She has no cervical adenopathy.  Skin: Skin is warm and dry. No rash noted.  Psychiatric: She has a normal mood and affect.  Nursing note and vitals reviewed.  Results for orders placed or performed in visit on 06/07/15  CBC with Differential/Platelet  Result Value Ref Range   WBC 5.6 4.0 - 10.5 K/uL   RBC 4.31 3.87 - 5.11 Mil/uL   Hemoglobin 13.6 12.0 - 15.0 g/dL   HCT 91.439.9 78.236.0 - 95.646.0 %   MCV 92.6 78.0 - 100.0 fl   MCHC 34.1 30.0 - 36.0 g/dL   RDW 21.312.6 08.611.5 - 57.815.5 %   Platelets 281.0 150.0 -  400.0 K/uL   Neutrophils Relative % 26.2 (L) 43.0 - 77.0 %   Lymphocytes Relative 58.8 Repeated and verified X2. (H) 12.0 - 46.0 %   Monocytes Relative 11.5 3.0 - 12.0 %   Eosinophils Relative 2.9 0.0 - 5.0 %   Basophils Relative 0.6 0.0 - 3.0 %   Neutro Abs 1.5 1.4 - 7.7 K/uL   Lymphs Abs 3.3 0.7 - 4.0 K/uL   Monocytes Absolute 0.6 0.1 - 1.0 K/uL   Eosinophils Absolute 0.2 0.0 - 0.7 K/uL   Basophils Absolute 0.0 0.0 - 0.1 K/uL      Assessment & Plan:   Problem List Items Addressed This Visit    Dry eyes - Primary    Dry eyes/dry mouth over last few months. Will trial lower propranolol dose (from 60mg  ER to 20mg  IR  BID and try taper down) - if increase in migraines, will restart 60mg  ER.  Discussed biotene and other symptomatic measures. H/o mildly positive anti SSB (2014).  If abnormal, low threshold to refer to rheum.      Relevant Orders   ANA   Rheumatoid factor   Sjogren's syndrome antibods(ssa + ssb)    Other Visit Diagnoses    Dry mouth        Relevant Orders    ANA    Rheumatoid factor    Sjogren's syndrome antibods(ssa + ssb)        Follow up plan: No Follow-up on file.  Eustaquio BoydenJavier Elonna Mcfarlane, MD

## 2015-10-04 NOTE — Addendum Note (Signed)
Addended by: Roena MaladyEVONTENNO, MELANIE Y on: 10/04/2015 10:56 AM   Modules accepted: Orders

## 2015-10-04 NOTE — Assessment & Plan Note (Signed)
Dry eyes/dry mouth over last few months. Will trial lower propranolol dose (from 60mg  ER to 20mg  IR BID and try taper down) - if increase in migraines, will restart 60mg  ER.  Discussed biotene and other symptomatic measures. H/o mildly positive anti SSB (2014).  If abnormal, low threshold to refer to rheum.

## 2015-10-04 NOTE — Progress Notes (Signed)
Pre visit review using our clinic review tool, if applicable. No additional management support is needed unless otherwise documented below in the visit note. 

## 2015-10-05 LAB — RHEUMATOID FACTOR

## 2015-10-07 LAB — ANA: Anti Nuclear Antibody(ANA): NEGATIVE

## 2015-10-07 LAB — SJOGREN'S SYNDROME ANTIBODS(SSA + SSB)
SSA (RO) (ENA) ANTIBODY, IGG: NEGATIVE
SSB (La) (ENA) Antibody, IgG: 1.2 — ABNORMAL HIGH

## 2015-10-10 ENCOUNTER — Other Ambulatory Visit: Payer: Self-pay | Admitting: Family Medicine

## 2015-10-10 DIAGNOSIS — R768 Other specified abnormal immunological findings in serum: Secondary | ICD-10-CM

## 2015-10-10 DIAGNOSIS — H04123 Dry eye syndrome of bilateral lacrimal glands: Secondary | ICD-10-CM

## 2015-11-21 DIAGNOSIS — R768 Other specified abnormal immunological findings in serum: Secondary | ICD-10-CM | POA: Diagnosis not present

## 2015-11-21 DIAGNOSIS — M2550 Pain in unspecified joint: Secondary | ICD-10-CM | POA: Diagnosis not present

## 2015-11-21 DIAGNOSIS — M3500 Sicca syndrome, unspecified: Secondary | ICD-10-CM | POA: Diagnosis not present

## 2015-11-21 MED FILL — PROPRANOLOL ER 60 MG CAP: 60 | 90 days supply | Qty: 90 | Fill #2

## 2015-11-21 MED FILL — VENLAFAXINE HCL ER 75 MG CA: 75 | 90 days supply | Qty: 90 | Fill #1

## 2015-11-21 MED FILL — DASETTA 1-35-28 TABLET: 1-35 | 84 days supply | Qty: 84 | Fill #1

## 2015-11-30 ENCOUNTER — Encounter: Payer: Self-pay | Admitting: Family Medicine

## 2016-01-21 DIAGNOSIS — M3500 Sicca syndrome, unspecified: Secondary | ICD-10-CM | POA: Diagnosis not present

## 2016-01-21 DIAGNOSIS — B354 Tinea corporis: Secondary | ICD-10-CM | POA: Diagnosis not present

## 2016-01-21 DIAGNOSIS — M2550 Pain in unspecified joint: Secondary | ICD-10-CM | POA: Diagnosis not present

## 2016-01-21 DIAGNOSIS — R768 Other specified abnormal immunological findings in serum: Secondary | ICD-10-CM | POA: Diagnosis not present

## 2016-01-21 MED FILL — KETOCONAZOLE 2% CREAM: 2 | 14 days supply | Qty: 60 | Fill #0

## 2016-01-23 ENCOUNTER — Encounter: Payer: Self-pay | Admitting: Family Medicine

## 2016-01-23 DIAGNOSIS — E041 Nontoxic single thyroid nodule: Secondary | ICD-10-CM

## 2016-01-23 HISTORY — DX: Nontoxic single thyroid nodule: E04.1

## 2016-01-24 DIAGNOSIS — J320 Chronic maxillary sinusitis: Secondary | ICD-10-CM | POA: Diagnosis not present

## 2016-01-24 DIAGNOSIS — J322 Chronic ethmoidal sinusitis: Secondary | ICD-10-CM | POA: Diagnosis not present

## 2016-01-24 DIAGNOSIS — J3501 Chronic tonsillitis: Secondary | ICD-10-CM | POA: Diagnosis not present

## 2016-01-24 DIAGNOSIS — J040 Acute laryngitis: Secondary | ICD-10-CM | POA: Diagnosis not present

## 2016-01-24 MED FILL — CEFUROXIME AXETIL 250 MG TA: 250 | 10 days supply | Qty: 20 | Fill #0

## 2016-01-24 MED FILL — LACTINEX CHEWABLE TABLET: 25 days supply | Qty: 50 | Fill #0

## 2016-01-29 ENCOUNTER — Encounter: Payer: Self-pay | Admitting: Family Medicine

## 2016-01-30 DIAGNOSIS — J320 Chronic maxillary sinusitis: Secondary | ICD-10-CM | POA: Diagnosis not present

## 2016-01-30 DIAGNOSIS — J322 Chronic ethmoidal sinusitis: Secondary | ICD-10-CM | POA: Diagnosis not present

## 2016-02-13 ENCOUNTER — Other Ambulatory Visit: Payer: Self-pay | Admitting: Family Medicine

## 2016-02-13 MED FILL — DASETTA 1-35-28 TABLET: 1-35 | 84 days supply | Qty: 84 | Fill #2

## 2016-02-13 MED FILL — VENLAFAXINE HCL ER 75 MG CA: 75 | 90 days supply | Qty: 90 | Fill #0

## 2016-02-13 MED FILL — PROPRANOLOL ER 60 MG CAP: 60 | 90 days supply | Qty: 90 | Fill #3

## 2016-04-03 ENCOUNTER — Ambulatory Visit (INDEPENDENT_AMBULATORY_CARE_PROVIDER_SITE_OTHER): Payer: 59 | Admitting: Family Medicine

## 2016-04-03 ENCOUNTER — Encounter: Payer: Self-pay | Admitting: Family Medicine

## 2016-04-03 VITALS — BP 118/72 | HR 88 | Temp 98.1°F | Wt 153.5 lb

## 2016-04-03 DIAGNOSIS — F4323 Adjustment disorder with mixed anxiety and depressed mood: Secondary | ICD-10-CM

## 2016-04-03 DIAGNOSIS — M3500 Sicca syndrome, unspecified: Secondary | ICD-10-CM | POA: Diagnosis not present

## 2016-04-03 DIAGNOSIS — K9041 Non-celiac gluten sensitivity: Secondary | ICD-10-CM

## 2016-04-03 DIAGNOSIS — E041 Nontoxic single thyroid nodule: Secondary | ICD-10-CM | POA: Diagnosis not present

## 2016-04-03 MED ORDER — VENLAFAXINE HCL ER 37.5 MG PO CP24: 37.5000 mg | ORAL_CAPSULE | Freq: Every day | ORAL | 6 refills | Status: DC

## 2016-04-03 NOTE — Patient Instructions (Signed)
I'll take a look at forms. Try to taper down to 37.5mg  effexor XR daily for next few months.  labwork today to check for gluten allergy.

## 2016-04-03 NOTE — Assessment & Plan Note (Addendum)
Reviewed work up to date. ?sjogren syndrome, ENT did not complete biopsy. Pt has not returned to rheumatology. If desires pregnancy may need to return to rheum to see if she's deemed high risk for pregnancy.

## 2016-04-03 NOTE — Assessment & Plan Note (Signed)
Not appreciated today. Continue to monitor.  

## 2016-04-03 NOTE — Assessment & Plan Note (Signed)
H/o depression. Very stable period, just was married, feels overall well and happy. Desires to taper off effexor XR - will decrease to 37.5mg  daily.

## 2016-04-03 NOTE — Progress Notes (Signed)
Pre visit review using our clinic review tool, if applicable. No additional management support is needed unless otherwise documented below in the visit note. 

## 2016-04-03 NOTE — Progress Notes (Signed)
BP 118/72   Pulse 88   Temp 98.1 F (36.7 C) (Oral)   Wt 153 lb 8 oz (69.6 kg)   BMI 23.34 kg/m    CC: f/u visit Subjective:    Patient ID: Samantha Norman, female    DOB: [birth year], 31 y.o.   MRN: [id number]  HPI: Samantha Norman is a 31 y.o. female presenting on 04/03/2016 for Follow-up   See recent patient emails. Referred to rheumatology for sicca syndrome with positive SSB, possible Sjogren's syndrome who referred to ENT for lip biopsy, ENT recommended against this. Treated for tonsillitis with abx course.   She actually did trial off gluten over last 1.5 months - and has felt better. Finds gluten causes GI upset, headache, joint pains, and nonspecific rash. No anemia history.   She recently was married, went to [facility] for honeymoon.   Relevant past medical, surgical, family and social history reviewed and updated as indicated. Interim medical history since our last visit reviewed. Allergies and medications reviewed and updated. Current Outpatient Prescriptions on File Prior to Visit  Medication Sig  . norethindrone-ethinyl estradiol 1/35 (NECON 1/35, 28,) tablet Take 1 tablet by mouth daily.  . propranolol (INDERAL) 20 MG tablet Take 1 tablet (20 mg total) by mouth 2 (two) times daily.   No current facility-administered medications on file prior to visit.     Review of Systems Per HPI unless specifically indicated in ROS section     Objective:    BP 118/72   Pulse 88   Temp 98.1 F (36.7 C) (Oral)   Wt 153 lb 8 oz (69.6 kg)   BMI 23.34 kg/m   Wt Readings from Last 3 Encounters:  04/03/16 153 lb 8 oz (69.6 kg)  10/04/15 146 lb 12 oz (66.6 kg)  07/08/15 148 lb (67.1 kg)    Physical Exam  Constitutional: She appears well-developed and well-nourished. No distress.  HENT:  Mouth/Throat: Oropharynx is clear and moist. No oropharyngeal exudate.  Eyes: Conjunctivae and EOM are normal. Pupils are equal, round, and reactive to light.  Neck: Normal range of motion. Neck  supple. No thyromegaly present.  Cardiovascular: Normal rate, regular rhythm, normal heart sounds and intact distal pulses.   No murmur heard. Pulmonary/Chest: Effort normal and breath sounds normal. No respiratory distress. She has no wheezes. She has no rales.  Musculoskeletal: She exhibits no edema.  Lymphadenopathy:    She has no cervical adenopathy.  Skin: Skin is warm and dry. No rash noted.  Psychiatric: She has a normal mood and affect.  Nursing note and vitals reviewed.  Results for orders placed or performed in visit on 10/04/15  ANA  Result Value Ref Range   Anit Nuclear Antibody(ANA) NEG NEGATIVE  Rheumatoid factor  Result Value Ref Range   Rhuematoid fact SerPl-aCnc <10 <=14 IU/mL  Sjogren's syndrome antibods(ssa + ssb)  Result Value Ref Range   SSA (Ro) (ENA) Antibody, IgG <1.0 NEG <1.0 NEG AI   SSB (La) (ENA) Antibody, IgG 1.2 POS (H) <1.0 NEG AI  POCT Urinalysis Dipstick (Automated)  Result Value Ref Range   Color, UA dark yellow    Clarity, UA clear    Glucose, UA neg    Bilirubin, UA +    Ketones, UA +    Spec Grav, UA >=1.030    Blood, UA neg    pH, UA 6.0    Protein, UA trace    Urobilinogen, UA negative    Nitrite, UA neg  Leukocytes, UA Negative Negative      Assessment & Plan:   Problem List Items Addressed This Visit    Adjustment disorder with mixed anxiety and depressed mood    H/o depression. Very stable period, just was married, feels overall well and happy. Desires to taper off effexor XR - will decrease to 37.5mg  daily.       Non-celiac gluten sensitivity - Primary    Symptoms of celiac sensitivity - will check for allergy today with TTG IgG. Pt will continue on gluten free diet as she has experienced benefit from this.       Relevant Orders   Tissue transglutaminase, IgA   Sicca syndrome (HCC)    Reviewed work up to date. ?sjogren syndrome, ENT did not complete biopsy. Pt has not returned to rheumatology. If desires pregnancy may  need to return to rheum to see if she's deemed high risk for pregnancy.       Thyroid nodule    Not appreciated today. Continue to monitor.          Follow up plan: Return as needed, for follow up visit.  Eustaquio BoydenJavier Hayden Mabin, MD

## 2016-04-03 NOTE — Assessment & Plan Note (Signed)
Symptoms of celiac sensitivity - will check for allergy today with TTG IgG. Pt will continue on gluten free diet as she has experienced benefit from this.

## 2016-04-06 LAB — TISSUE TRANSGLUTAMINASE, IGA: Tissue Transglutaminase Ab, IgA: 1 U/mL (ref <4)

## 2016-04-12 ENCOUNTER — Encounter: Payer: Self-pay | Admitting: Family Medicine

## 2016-04-14 NOTE — Telephone Encounter (Signed)
See pt email - plz fax form and have copy available for patient to pick up. Thanks.  Placed in Kim's box.

## 2016-04-14 NOTE — Telephone Encounter (Signed)
Letter faxed and original placed up front for pick up.

## 2016-04-22 DIAGNOSIS — M2550 Pain in unspecified joint: Secondary | ICD-10-CM | POA: Diagnosis not present

## 2016-04-22 DIAGNOSIS — J40 Bronchitis, not specified as acute or chronic: Secondary | ICD-10-CM | POA: Diagnosis not present

## 2016-04-22 DIAGNOSIS — M3500 Sicca syndrome, unspecified: Secondary | ICD-10-CM | POA: Diagnosis not present

## 2016-04-22 DIAGNOSIS — R768 Other specified abnormal immunological findings in serum: Secondary | ICD-10-CM | POA: Diagnosis not present

## 2016-04-22 DIAGNOSIS — B354 Tinea corporis: Secondary | ICD-10-CM | POA: Diagnosis not present

## 2016-04-22 DIAGNOSIS — J9801 Acute bronchospasm: Secondary | ICD-10-CM | POA: Diagnosis not present

## 2016-04-22 DIAGNOSIS — Z6822 Body mass index (BMI) 22.0-22.9, adult: Secondary | ICD-10-CM | POA: Diagnosis not present

## 2016-04-22 MED FILL — VENLAFAXINE HCL ER 37.5 MG: 37.5 | 30 days supply | Qty: 30 | Fill #0

## 2016-04-22 MED FILL — VENTOLIN HFA 90 MCG INHALER: 108 (90 BAS | 25 days supply | Qty: 18 | Fill #0

## 2016-04-22 MED FILL — AZITHROMYCIN 250 MG TABLET: 250 | 5 days supply | Qty: 6 | Fill #0

## 2016-04-29 ENCOUNTER — Encounter: Payer: Self-pay | Admitting: Family Medicine

## 2016-05-01 MED FILL — DASETTA 1-35-28 TABLET: 1-35 | 84 days supply | Qty: 84 | Fill #3

## 2016-05-04 ENCOUNTER — Telehealth: Payer: Self-pay | Admitting: Family Medicine

## 2016-05-04 NOTE — Telephone Encounter (Signed)
[employer] short term disability in dr g in box

## 2016-05-09 NOTE — Telephone Encounter (Signed)
Filled and in Kim's box. 

## 2016-05-11 NOTE — Telephone Encounter (Signed)
Returned to Robin 

## 2016-05-14 NOTE — Telephone Encounter (Signed)
Left message asking pt to call office she needs to sign medical release form before I can sent office notes to [employer].  Paperwork on robin's desk in folder

## 2016-05-28 NOTE — Telephone Encounter (Signed)
Paperwork faxed 05/20/16 Copy for pt Copy for file Copy for scan

## 2016-07-28 ENCOUNTER — Telehealth: Payer: Self-pay | Admitting: *Deleted

## 2016-07-28 DIAGNOSIS — Z01419 Encounter for gynecological examination (general) (routine) without abnormal findings: Secondary | ICD-10-CM

## 2016-07-28 MED ORDER — NORETHINDRONE-ETH ESTRADIOL 1-35 MG-MCG PO TABS: 1.0000 | ORAL_TABLET | Freq: Every day | ORAL | 0 refills | Status: DC

## 2016-07-28 MED FILL — DASETTA 1-35-28 TABLET: 1-35 | 84 days supply | Qty: 84 | Fill #0

## 2016-07-28 NOTE — Telephone Encounter (Signed)
Pt left message requesting refill of OCP's.  She stated she has appt coming up soon but will be out of medication before the appt. Refill sent per standing order. Message left on pt's voicemail with this information.

## 2016-08-18 ENCOUNTER — Ambulatory Visit: Payer: 59 | Admitting: Obstetrics & Gynecology

## 2016-09-14 DIAGNOSIS — Z0120 Encounter for dental examination and cleaning without abnormal findings: Secondary | ICD-10-CM | POA: Diagnosis not present

## 2016-09-21 ENCOUNTER — Ambulatory Visit: Payer: 59 | Admitting: Obstetrics & Gynecology

## 2016-09-21 ENCOUNTER — Ambulatory Visit (INDEPENDENT_AMBULATORY_CARE_PROVIDER_SITE_OTHER): Payer: 59 | Admitting: Obstetrics & Gynecology

## 2016-09-21 VITALS — BP 106/67 | HR 70 | Ht 68.0 in | Wt 151.8 lb

## 2016-09-21 DIAGNOSIS — Z01419 Encounter for gynecological examination (general) (routine) without abnormal findings: Secondary | ICD-10-CM | POA: Diagnosis not present

## 2016-09-21 DIAGNOSIS — Z3009 Encounter for other general counseling and advice on contraception: Secondary | ICD-10-CM

## 2016-09-21 DIAGNOSIS — Z3169 Encounter for other general counseling and advice on procreation: Secondary | ICD-10-CM

## 2016-09-21 MED ORDER — NORETHINDRONE-ETH ESTRADIOL 1-35 MG-MCG PO TABS: 1.0000 | ORAL_TABLET | Freq: Every day | ORAL | 4 refills | Status: DC

## 2016-09-21 NOTE — Progress Notes (Signed)
Subjective:     Alvino BloodStacey Karl Leece is a 31 y.o. female here for a routine exam. G0  LMP 09/16/2016 Current complaints: no.   Married x 6 months- monogamous.     Gynecologic History Patient's last menstrual period was 09/16/2016 (exact date). Contraception: OCP (estrogen/progesterone) Last Pap: 06/18/2014. Results were: normal Last mammogram: n/a. Results were: normal  Obstetric History OB History  Gravida Para Term Preterm AB Living  0 0 0 0 0 0  SAB TAB Ectopic Multiple Live Births  0 0 0 0          The following portions of the patient's history were reviewed and updated as appropriate: allergies, current medications, past family history, past medical history, past social history, past surgical history and problem list.  Review of Systems Pertinent items noted in HPI and remainder of comprehensive ROS otherwise negative.    Objective:  BP 106/67   Pulse 70   Ht 5\' 8"  (1.727 m)   Wt 151 lb 12.8 oz (68.9 kg)   LMP 09/16/2016 (Exact Date)   BMI 23.08 kg/m   General Appearance:    Alert, cooperative, no distress, appears stated age  Head:    Normocephalic, without obvious abnormality, atraumatic  Eyes:    conjunctiva/corneas clear, EOM's intact, both eyes  Ears:    Normal external ear canals, both ears  Nose:   Nares normal, septum midline, mucosa normal, no drainage    or sinus tenderness  Throat:   Lips, mucosa, and tongue normal; teeth and gums normal  Neck:   Supple, symmetrical, trachea midline, no adenopathy;    thyroid:  no enlargement/tenderness/nodules  Back:     Symmetric, no curvature, ROM normal, no CVA tenderness  Lungs:     Clear to auscultation bilaterally, respirations unlabored  Chest Wall:    No tenderness or deformity   Heart:    Regular rate and rhythm, S1 and S2 normal, no murmur, rub   or gallop  Breast Exam:    No tenderness, masses, or nipple abnormality  Abdomen:     Soft, non-tender, bowel sounds active all four quadrants,    no masses, no  organomegaly  Genitalia:    Normal female without lesion, discharge or tenderness     Extremities:   Extremities normal, atraumatic, no cyanosis or edema  Pulses:   2+ and symmetric all extremities  Skin:   Skin color, texture, turgor normal, no rashes or lesions     Assessment:    Healthy female exam.   Contraception management Preconception counseling   Plan:    Follow up in: 1 year.  Refill OCPs- [place] 1/35 Keep PNV Pt to stop OCPs when she is ready to conceive  Shelly Shoultz L. Harraway-Smith, M.D., Evern CoreFACOG

## 2016-09-21 NOTE — Patient Instructions (Signed)

## 2016-10-20 DIAGNOSIS — B354 Tinea corporis: Secondary | ICD-10-CM | POA: Diagnosis not present

## 2016-10-20 DIAGNOSIS — R768 Other specified abnormal immunological findings in serum: Secondary | ICD-10-CM | POA: Diagnosis not present

## 2016-10-20 DIAGNOSIS — M2550 Pain in unspecified joint: Secondary | ICD-10-CM | POA: Diagnosis not present

## 2016-10-20 DIAGNOSIS — M3500 Sicca syndrome, unspecified: Secondary | ICD-10-CM | POA: Diagnosis not present

## 2016-10-20 DIAGNOSIS — Z6823 Body mass index (BMI) 23.0-23.9, adult: Secondary | ICD-10-CM | POA: Diagnosis not present

## 2016-11-12 ENCOUNTER — Other Ambulatory Visit: Payer: Self-pay | Admitting: Internal Medicine

## 2016-11-12 MED ORDER — CIPROFLOXACIN HCL 500 MG PO TABS: 500.0000 mg | ORAL_TABLET | Freq: Two times a day (BID) | ORAL | 0 refills | Status: DC

## 2016-11-12 MED FILL — CIPROFLOXACIN HCL 500 MG TA: 500 | 5 days supply | Qty: 10 | Fill #0

## 2016-12-14 ENCOUNTER — Encounter: Payer: Self-pay | Admitting: General Practice

## 2016-12-14 ENCOUNTER — Ambulatory Visit (INDEPENDENT_AMBULATORY_CARE_PROVIDER_SITE_OTHER): Payer: 59

## 2016-12-14 ENCOUNTER — Encounter: Payer: Self-pay | Admitting: Obstetrics & Gynecology

## 2016-12-14 DIAGNOSIS — Z3201 Encounter for pregnancy test, result positive: Secondary | ICD-10-CM | POA: Diagnosis not present

## 2016-12-14 LAB — POCT PREGNANCY, URINE: Preg Test, Ur: POSITIVE — AB

## 2016-12-14 NOTE — Progress Notes (Signed)
Patient presented to the office for a UPT. UPT positive. Patient's LMP was 11/09/16 with an  EDD of 08/16/17. Patient is 5w od. Advised patient to start prenatal vitamins and prenatal care. Patient verbalized understanding and had no questions.

## 2016-12-15 ENCOUNTER — Encounter: Payer: Self-pay | Admitting: Family Medicine

## 2016-12-15 DIAGNOSIS — Z3401 Encounter for supervision of normal first pregnancy, first trimester: Secondary | ICD-10-CM

## 2016-12-15 NOTE — Progress Notes (Signed)
Agree with nursing staff's documentation of this patient's clinic encounter.  Terrian Ridlon, MD    

## 2016-12-30 DIAGNOSIS — N911 Secondary amenorrhea: Secondary | ICD-10-CM | POA: Diagnosis not present

## 2017-01-08 DIAGNOSIS — Z3401 Encounter for supervision of normal first pregnancy, first trimester: Secondary | ICD-10-CM | POA: Diagnosis not present

## 2017-01-08 DIAGNOSIS — Z3685 Encounter for antenatal screening for Streptococcus B: Secondary | ICD-10-CM | POA: Diagnosis not present

## 2017-01-08 LAB — OB RESULTS CONSOLE ABO/RH: RH TYPE: POSITIVE

## 2017-01-08 LAB — OB RESULTS CONSOLE GC/CHLAMYDIA
Chlamydia: NEGATIVE
Gonorrhea: NEGATIVE

## 2017-01-08 LAB — OB RESULTS CONSOLE HEPATITIS B SURFACE ANTIGEN: HEP B S AG: NEGATIVE

## 2017-01-08 LAB — OB RESULTS CONSOLE ANTIBODY SCREEN: ANTIBODY SCREEN: NEGATIVE

## 2017-01-08 LAB — OB RESULTS CONSOLE RPR: RPR: NONREACTIVE

## 2017-01-08 LAB — OB RESULTS CONSOLE HIV ANTIBODY (ROUTINE TESTING): HIV: NONREACTIVE

## 2017-01-08 LAB — OB RESULTS CONSOLE RUBELLA ANTIBODY, IGM: RUBELLA: IMMUNE

## 2017-01-20 DIAGNOSIS — Z315 Encounter for genetic counseling: Secondary | ICD-10-CM | POA: Diagnosis not present

## 2017-01-20 DIAGNOSIS — Z3401 Encounter for supervision of normal first pregnancy, first trimester: Secondary | ICD-10-CM | POA: Diagnosis not present

## 2017-01-20 DIAGNOSIS — Z3480 Encounter for supervision of other normal pregnancy, unspecified trimester: Secondary | ICD-10-CM | POA: Diagnosis not present

## 2017-01-20 DIAGNOSIS — Z113 Encounter for screening for infections with a predominantly sexual mode of transmission: Secondary | ICD-10-CM | POA: Diagnosis not present

## 2017-01-27 ENCOUNTER — Encounter: Payer: 59 | Admitting: Advanced Practice Midwife

## 2017-02-01 DIAGNOSIS — H5213 Myopia, bilateral: Secondary | ICD-10-CM | POA: Diagnosis not present

## 2017-02-04 DIAGNOSIS — Z36 Encounter for antenatal screening for chromosomal anomalies: Secondary | ICD-10-CM | POA: Diagnosis not present

## 2017-02-04 DIAGNOSIS — Z3491 Encounter for supervision of normal pregnancy, unspecified, first trimester: Secondary | ICD-10-CM | POA: Diagnosis not present

## 2017-02-04 DIAGNOSIS — Z3A12 12 weeks gestation of pregnancy: Secondary | ICD-10-CM | POA: Diagnosis not present

## 2017-02-04 DIAGNOSIS — Z3682 Encounter for antenatal screening for nuchal translucency: Secondary | ICD-10-CM | POA: Diagnosis not present

## 2017-02-22 DIAGNOSIS — Z3480 Encounter for supervision of other normal pregnancy, unspecified trimester: Secondary | ICD-10-CM | POA: Diagnosis not present

## 2017-03-18 DIAGNOSIS — Z3A18 18 weeks gestation of pregnancy: Secondary | ICD-10-CM | POA: Diagnosis not present

## 2017-03-18 DIAGNOSIS — Z3400 Encounter for supervision of normal first pregnancy, unspecified trimester: Secondary | ICD-10-CM | POA: Diagnosis not present

## 2017-03-18 DIAGNOSIS — Z363 Encounter for antenatal screening for malformations: Secondary | ICD-10-CM | POA: Diagnosis not present

## 2017-03-30 DIAGNOSIS — N200 Calculus of kidney: Secondary | ICD-10-CM | POA: Insufficient documentation

## 2017-03-30 HISTORY — PX: DILATION AND CURETTAGE OF UTERUS: SHX78

## 2017-03-30 NOTE — L&D Delivery Note (Signed)
Delivery Note At 12:40 PM a viable female was delivered via Vaginal, Spontaneous OA APGAR: 8, 9; weight  pending.   Placenta status:spontaneously with 3 vessel cord , .  Cord:  with the following complications: none.  Cord pH: not obtained  Anesthesia:  epidural Episiotomy: Median Lacerations: None Suture Repair: 2.0 vicryl Est. Blood Loss (mL): 300  Mom to postpartum.  Baby to Couplet care / Skin to Skin.  Macen Joslin L 08/17/2017, 12:52 PM

## 2017-04-15 DIAGNOSIS — Z362 Encounter for other antenatal screening follow-up: Secondary | ICD-10-CM | POA: Diagnosis not present

## 2017-04-15 DIAGNOSIS — Z3A22 22 weeks gestation of pregnancy: Secondary | ICD-10-CM | POA: Diagnosis not present

## 2017-04-15 DIAGNOSIS — Z3400 Encounter for supervision of normal first pregnancy, unspecified trimester: Secondary | ICD-10-CM | POA: Diagnosis not present

## 2017-04-16 ENCOUNTER — Other Ambulatory Visit (HOSPITAL_COMMUNITY): Payer: Self-pay | Admitting: Obstetrics and Gynecology

## 2017-04-16 DIAGNOSIS — Z3689 Encounter for other specified antenatal screening: Secondary | ICD-10-CM

## 2017-04-16 DIAGNOSIS — O283 Abnormal ultrasonic finding on antenatal screening of mother: Secondary | ICD-10-CM

## 2017-04-16 DIAGNOSIS — Z3A22 22 weeks gestation of pregnancy: Secondary | ICD-10-CM

## 2017-04-20 ENCOUNTER — Encounter (HOSPITAL_COMMUNITY): Payer: Self-pay | Admitting: *Deleted

## 2017-04-21 ENCOUNTER — Ambulatory Visit (HOSPITAL_COMMUNITY)
Admission: RE | Admit: 2017-04-21 | Discharge: 2017-04-21 | Disposition: A | Discharge status: Home / Self Care | Payer: 59 | Source: Ambulatory Visit | Attending: Obstetrics and Gynecology | Admitting: Obstetrics and Gynecology

## 2017-04-21 ENCOUNTER — Other Ambulatory Visit (HOSPITAL_COMMUNITY): Payer: Self-pay | Admitting: *Deleted

## 2017-04-21 ENCOUNTER — Other Ambulatory Visit: Payer: Self-pay

## 2017-04-21 ENCOUNTER — Encounter (HOSPITAL_COMMUNITY): Payer: Self-pay

## 2017-04-21 DIAGNOSIS — Z3A22 22 weeks gestation of pregnancy: Secondary | ICD-10-CM

## 2017-04-21 DIAGNOSIS — O359XX0 Maternal care for (suspected) fetal abnormality and damage, unspecified, not applicable or unspecified: Secondary | ICD-10-CM | POA: Diagnosis not present

## 2017-04-21 DIAGNOSIS — Z363 Encounter for antenatal screening for malformations: Secondary | ICD-10-CM | POA: Diagnosis not present

## 2017-04-21 DIAGNOSIS — Z3689 Encounter for other specified antenatal screening: Secondary | ICD-10-CM

## 2017-04-21 DIAGNOSIS — O358XX0 Maternal care for other (suspected) fetal abnormality and damage, not applicable or unspecified: Secondary | ICD-10-CM

## 2017-04-21 DIAGNOSIS — Z3A23 23 weeks gestation of pregnancy: Secondary | ICD-10-CM | POA: Insufficient documentation

## 2017-04-21 DIAGNOSIS — O283 Abnormal ultrasonic finding on antenatal screening of mother: Secondary | ICD-10-CM

## 2017-04-21 DIAGNOSIS — O35EXX0 Maternal care for other (suspected) fetal abnormality and damage, fetal genitourinary anomalies, not applicable or unspecified: Secondary | ICD-10-CM

## 2017-05-17 DIAGNOSIS — Z3480 Encounter for supervision of other normal pregnancy, unspecified trimester: Secondary | ICD-10-CM | POA: Diagnosis not present

## 2017-05-17 DIAGNOSIS — Z3400 Encounter for supervision of normal first pregnancy, unspecified trimester: Secondary | ICD-10-CM | POA: Diagnosis not present

## 2017-05-17 DIAGNOSIS — Z23 Encounter for immunization: Secondary | ICD-10-CM | POA: Diagnosis not present

## 2017-05-31 ENCOUNTER — Ambulatory Visit (HOSPITAL_COMMUNITY)
Admission: RE | Admit: 2017-05-31 | Discharge: 2017-05-31 | Disposition: A | Discharge status: Home / Self Care | Payer: 59 | Source: Ambulatory Visit | Attending: Obstetrics and Gynecology | Admitting: Obstetrics and Gynecology

## 2017-05-31 ENCOUNTER — Encounter (HOSPITAL_COMMUNITY): Payer: Self-pay

## 2017-05-31 DIAGNOSIS — Z3A29 29 weeks gestation of pregnancy: Secondary | ICD-10-CM | POA: Diagnosis not present

## 2017-05-31 DIAGNOSIS — Z362 Encounter for other antenatal screening follow-up: Secondary | ICD-10-CM | POA: Diagnosis not present

## 2017-05-31 DIAGNOSIS — O35EXX0 Maternal care for other (suspected) fetal abnormality and damage, fetal genitourinary anomalies, not applicable or unspecified: Secondary | ICD-10-CM

## 2017-05-31 DIAGNOSIS — O358XX0 Maternal care for other (suspected) fetal abnormality and damage, not applicable or unspecified: Secondary | ICD-10-CM | POA: Insufficient documentation

## 2017-07-03 ENCOUNTER — Inpatient Hospital Stay (EMERGENCY_DEPARTMENT_HOSPITAL)
Admission: AD | Admit: 2017-07-03 | Discharge: 2017-07-03 | Disposition: A | Discharge status: Home / Self Care | Payer: 59 | Source: Ambulatory Visit | Attending: Obstetrics and Gynecology | Admitting: Obstetrics and Gynecology

## 2017-07-03 ENCOUNTER — Encounter (HOSPITAL_COMMUNITY): Payer: Self-pay | Admitting: *Deleted

## 2017-07-03 DIAGNOSIS — M549 Dorsalgia, unspecified: Secondary | ICD-10-CM | POA: Insufficient documentation

## 2017-07-03 DIAGNOSIS — E876 Hypokalemia: Secondary | ICD-10-CM | POA: Diagnosis not present

## 2017-07-03 DIAGNOSIS — O9989 Other specified diseases and conditions complicating pregnancy, childbirth and the puerperium: Secondary | ICD-10-CM | POA: Insufficient documentation

## 2017-07-03 DIAGNOSIS — Z3A33 33 weeks gestation of pregnancy: Secondary | ICD-10-CM

## 2017-07-03 DIAGNOSIS — A084 Viral intestinal infection, unspecified: Secondary | ICD-10-CM | POA: Diagnosis not present

## 2017-07-03 DIAGNOSIS — R197 Diarrhea, unspecified: Secondary | ICD-10-CM | POA: Insufficient documentation

## 2017-07-03 DIAGNOSIS — R509 Fever, unspecified: Secondary | ICD-10-CM | POA: Insufficient documentation

## 2017-07-03 DIAGNOSIS — O26893 Other specified pregnancy related conditions, third trimester: Secondary | ICD-10-CM | POA: Insufficient documentation

## 2017-07-03 DIAGNOSIS — O99613 Diseases of the digestive system complicating pregnancy, third trimester: Secondary | ICD-10-CM | POA: Diagnosis not present

## 2017-07-03 DIAGNOSIS — O212 Late vomiting of pregnancy: Secondary | ICD-10-CM | POA: Diagnosis not present

## 2017-07-03 DIAGNOSIS — A09 Infectious gastroenteritis and colitis, unspecified: Secondary | ICD-10-CM | POA: Diagnosis not present

## 2017-07-03 DIAGNOSIS — Z3A34 34 weeks gestation of pregnancy: Secondary | ICD-10-CM | POA: Diagnosis not present

## 2017-07-03 LAB — URINALYSIS, ROUTINE W REFLEX MICROSCOPIC
Bilirubin Urine: NEGATIVE
Glucose, UA: NEGATIVE mg/dL
Ketones, ur: 20 mg/dL — AB
NITRITE: NEGATIVE
Protein, ur: 30 mg/dL — AB
SPECIFIC GRAVITY, URINE: 1.02 (ref 1.005–1.030)
pH: 6 (ref 5.0–8.0)

## 2017-07-03 LAB — CBC WITH DIFFERENTIAL/PLATELET
BASOS PCT: 0 %
Basophils Absolute: 0 10*3/uL (ref 0.0–0.1)
Eosinophils Absolute: 0 10*3/uL (ref 0.0–0.7)
Eosinophils Relative: 0 %
HEMATOCRIT: 33.1 % — AB (ref 36.0–46.0)
HEMOGLOBIN: 11.4 g/dL — AB (ref 12.0–15.0)
LYMPHS PCT: 7 %
Lymphs Abs: 0.9 10*3/uL (ref 0.7–4.0)
MCH: 32.5 pg (ref 26.0–34.0)
MCHC: 34.4 g/dL (ref 30.0–36.0)
MCV: 94.3 fL (ref 78.0–100.0)
MONOS PCT: 3 %
Monocytes Absolute: 0.4 10*3/uL (ref 0.1–1.0)
Neutro Abs: 11 10*3/uL — ABNORMAL HIGH (ref 1.7–7.7)
Neutrophils Relative %: 90 %
OTHER: 0 %
Platelets: 167 10*3/uL (ref 150–400)
RBC: 3.51 MIL/uL — ABNORMAL LOW (ref 3.87–5.11)
RDW: 12.9 % (ref 11.5–15.5)
WBC Morphology: INCREASED
WBC: 12.3 10*3/uL — ABNORMAL HIGH (ref 4.0–10.5)

## 2017-07-03 LAB — COMPREHENSIVE METABOLIC PANEL
ALK PHOS: 75 U/L (ref 38–126)
ALT: 17 U/L (ref 14–54)
AST: 22 U/L (ref 15–41)
Albumin: 3.1 g/dL — ABNORMAL LOW (ref 3.5–5.0)
Anion gap: 11 (ref 5–15)
BILIRUBIN TOTAL: 1 mg/dL (ref 0.3–1.2)
BUN: 6 mg/dL (ref 6–20)
CALCIUM: 8.4 mg/dL — AB (ref 8.9–10.3)
CO2: 16 mmol/L — AB (ref 22–32)
CREATININE: 0.62 mg/dL (ref 0.44–1.00)
Chloride: 103 mmol/L (ref 101–111)
GFR calc Af Amer: >60 mL/min (ref >60)
Glucose, Bld: 130 mg/dL — ABNORMAL HIGH (ref 65–99)
Potassium: 3.2 mmol/L — ABNORMAL LOW (ref 3.5–5.1)
Sodium: 130 mmol/L — ABNORMAL LOW (ref 135–145)
Total Protein: 6.5 g/dL (ref 6.5–8.1)

## 2017-07-03 LAB — LACTIC ACID, PLASMA: LACTIC ACID, VENOUS: 1.7 mmol/L (ref 0.5–1.9)

## 2017-07-03 MED ORDER — ACETAMINOPHEN 325 MG PO TABS
650.0000 mg | ORAL_TABLET | Freq: Once | ORAL | Status: AC
  Administered 2017-07-03: 650 mg via ORAL
  Filled 2017-07-03: qty 2

## 2017-07-03 MED ORDER — LACTATED RINGERS IV BOLUS
1000.0000 mL | Freq: Once | INTRAVENOUS | Status: AC
  Administered 2017-07-03: 1000 mL via INTRAVENOUS

## 2017-07-03 MED ORDER — LOPERAMIDE HCL 2 MG PO CAPS
2.0000 mg | ORAL_CAPSULE | Freq: Once | ORAL | Status: AC
  Administered 2017-07-03: 2 mg via ORAL
  Filled 2017-07-03: qty 1

## 2017-07-03 MED ORDER — LOPERAMIDE HCL 2 MG PO CAPS: 2.0000 mg | ORAL_CAPSULE | Freq: Four times a day (QID) | ORAL | 0 refills | Status: DC | PRN

## 2017-07-03 MED ORDER — ONDANSETRON 8 MG PO TBDP: 8.0000 mg | ORAL_TABLET | Freq: Three times a day (TID) | ORAL | 0 refills | Status: DC | PRN

## 2017-07-03 MED ORDER — ONDANSETRON 8 MG PO TBDP
8.0000 mg | ORAL_TABLET | Freq: Once | ORAL | Status: AC
  Administered 2017-07-03: 8 mg via ORAL
  Filled 2017-07-03: qty 1

## 2017-07-03 NOTE — MAU Provider Note (Signed)
History   G1 @ 33.5 wks with c/o fever, diarrhea, no vomiting. States diarrhea started Thursday and has progressively gotten worse with fever.  CSN: [id number]  Arrival date & time 07/03/17  1031   None     Chief Complaint  Patient presents with  . Back Pain  . Diarrhea  . Chills  . Fever    HPI  Past Medical History:  Diagnosis Date  . Dizziness    "goes along w/my migraines"  . Family history of adverse reaction to anesthesia    "Mom is overweight, has sleep apnea; has breathing issues w/anesthesia"  . History of chicken pox   . History of depression   . 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 Lumbar vertebral fracture ([place]) 2004   "sports related; L5; no OR"  . Migraine headache    "monthly even w/my RX" (05/31/2015)  . Mononucleosis dx'd 05/2015  . Pneumonia ~ 2010  . Sicca syndrome ([place]) 10/04/2015   Sicca syndrome with polyarthralgia. Established with Dr Trudie Reed rheum - glandular without systemic manifestations - continue topical treatment, no need for systemic therapy at this time. ANA neg, ESR 2, CRP 0.6, anti CCP 5 (10/2015)   . Spondylolysis of lumbosacral region 2004   chronic R L5  . Thyroid nodule 01/23/2016   Right lower thyroid gland 6 mm nodule by CT (05/2015)    Past Surgical History:  Procedure Laterality Date  . brain MRI  11/2013   WNL, no demyelinating disease  . evoked brainstem auditory response  2014   WNL ([facility])    Family History  Problem Relation Age of Onset  . Hypertension Mother   . Cancer Mother 63       breast  . Alzheimer's disease Maternal Uncle 6       early onset  . Cancer Paternal Uncle        prostate  . Cancer Paternal Grandfather        prostate  . Stroke Father 84       unclear cause - small vessel disease  . Stroke Paternal Grandmother 31       multiple   . Stroke Paternal Uncle 17  . CAD Maternal Grandmother 80  . Diabetes Paternal Uncle     Social History   Tobacco Use  . Smoking status: Never Smoker  . Smokeless tobacco:  Never Used  Substance Use Topics  . Alcohol use: No    Alcohol/week: 0.0 oz    Comment: occasionally  . Drug use: No    OB History    Gravida  1   Para  0   Term  0   Preterm  0   AB  0   Living  0     SAB  0   TAB  0   Ectopic  0   Multiple  0   Live Births              Review of Systems  Constitutional: Positive for fever.  HENT: Negative.   Eyes: Negative.   Cardiovascular: Negative.   Gastrointestinal: Positive for diarrhea.  Endocrine: Negative.   Genitourinary: Negative.   Musculoskeletal: Negative.   Skin: Negative.   Allergic/Immunologic: Negative.   Neurological: Negative.   Hematological: Negative.   Psychiatric/Behavioral: Negative.     Allergies  Patient has no known allergies.  Home Medications    BP 119/71 (BP Location: Right Arm)   Pulse (!) 144   Temp (!) 102.7 F (39.3 C) (Oral)  Resp 18   LMP 11/09/2016   SpO2 100%   Physical Exam  Constitutional: She is oriented to person, place, and time. She appears well-developed and well-nourished.  HENT:  Head: Normocephalic.  Eyes: Pupils are equal, round, and reactive to light.  Neck: Normal range of motion.  Cardiovascular: Normal rate, regular rhythm, normal heart sounds and intact distal pulses.  Pulmonary/Chest: Effort normal and breath sounds normal.  Abdominal: Soft. Bowel sounds are normal.  Genitourinary: Vagina normal and uterus normal.  Musculoskeletal: Normal range of motion.  Neurological: She is alert and oriented to person, place, and time. She has normal reflexes.  Skin: Skin is warm and dry.  Psychiatric: She has a normal mood and affect. Her behavior is normal. Judgment and thought content normal.    MAU Course  Procedures (including critical care time)  Labs Reviewed  CBC WITH DIFFERENTIAL/PLATELET - Abnormal; Notable for the following components:      Result Value   WBC 12.3 (*)    RBC 3.51 (*)    Hemoglobin 11.4 (*)    HCT 33.1 (*)    All other  components within normal limits  COMPREHENSIVE METABOLIC PANEL - Abnormal; Notable for the following components:   Sodium 130 (*)    Potassium 3.2 (*)    CO2 16 (*)    Glucose, Bld 130 (*)    Calcium 8.4 (*)    Albumin 3.1 (*)    All other components within normal limits  URINALYSIS, ROUTINE W REFLEX MICROSCOPIC - Abnormal; Notable for the following components:   APPearance HAZY (*)    Hgb urine dipstick SMALL (*)    Ketones, ur 20 (*)    Protein, ur 30 (*)    Leukocytes, UA SMALL (*)    Bacteria, UA RARE (*)    Squamous Epithelial / LPF 6-30 (*)    All other components within normal limits  LACTIC ACID, PLASMA   No results found.   No diagnosis found.    MDM  Temp max 102.7. fhr 170's no decels. POC discussed with Dr. Corinna Capra. To give one more dose of tylenol and immodium, decrease fluids to 200cc/hr.  15:11 pt now feeling better, POC discussed with Dr. Corinna Capra pt to be d/c home in stable condition. To f/u if any worsening.

## 2017-07-03 NOTE — Discharge Instructions (Signed)
Viral Gastroenteritis, Adult  Viral gastroenteritis is also known as the stomach flu. This condition is caused by various viruses. These viruses can be passed from person to person very easily (are very contagious). This condition may affect your stomach, small intestine, and large intestine. It can cause sudden watery diarrhea, fever, and vomiting.  Diarrhea and vomiting can make you feel weak and cause you to become dehydrated. You may not be able to keep fluids down. Dehydration can make you tired and thirsty, cause you to have a dry mouth, and decrease how often you urinate. Older adults and people with other diseases or a weak immune system are at higher risk for dehydration.  It is important to replace the fluids that you lose from diarrhea and vomiting. If you become severely dehydrated, you may need to get fluids through an IV tube.  What are the causes?  Gastroenteritis is caused by various viruses, including rotavirus and norovirus. Norovirus is the most common cause in adults.  You can get sick by eating food, drinking water, or touching a surface contaminated with one of these viruses. You can also get sick from sharing utensils or other personal items with an infected person.  What increases the risk?  This condition is more likely to develop in people:  · Who have a weak defense system (immune system).  · Who live with one or more children who are younger than 2 years old.  · Who live in a nursing home.  · Who go on cruise ships.    What are the signs or symptoms?  Symptoms of this condition start suddenly 1-2 days after exposure to a virus. Symptoms may last a few days or as long as a week. The most common symptoms are watery diarrhea and vomiting. Other symptoms include:  · Fever.  · Headache.  · Fatigue.  · Pain in the abdomen.  · Chills.  · Weakness.  · Nausea.  · Muscle aches.  · Loss of appetite.    How is this diagnosed?  This condition is diagnosed with a medical history and physical exam. You  may also have a stool test to check for viruses or other infections.  How is this treated?  This condition typically goes away on its own. The focus of treatment is to restore lost fluids (rehydration). Your health care provider may recommend that you take an oral rehydration solution (ORS) to replace important salts and minerals (electrolytes) in your body. Severe cases of this condition may require giving fluids through an IV tube.  Treatment may also include medicine to help with your symptoms.  Follow these instructions at home:  Follow instructions from your health care provider about how to care for yourself at home.  Eating and drinking  Follow these recommendations as told by your health care provider:  · Take an ORS. This is a drink that is sold at pharmacies and retail stores.  · Drink clear fluids in small amounts as you are able. Clear fluids include water, ice chips, diluted fruit juice, and low-calorie sports drinks.  · Eat bland, easy-to-digest foods in small amounts as you are able. These foods include bananas, applesauce, rice, lean meats, toast, and crackers.  · Avoid fluids that contain a lot of sugar or caffeine, such as energy drinks, sports drinks, and soda.  · Avoid alcohol.  · Avoid spicy or fatty foods.    General instructions    · Drink enough fluid to keep your urine clear or   pale yellow.  · Wash your hands often. If soap and water are not available, use hand sanitizer.  · Make sure that all people in your household wash their hands well and often.  · Take over-the-counter and prescription medicines only as told by your health care provider.  · Rest at home while you recover.  · Watch your condition for any changes.  · Take a warm bath to relieve any burning or pain from frequent diarrhea episodes.  · Keep all follow-up visits as told by your health care provider. This is important.  Contact a health care provider if:  · You cannot keep fluids down.  · Your symptoms get worse.  · You have  new symptoms.  · You feel light-headed or dizzy.  · You have muscle cramps.  Get help right away if:  · You have chest pain.  · You feel extremely weak or you faint.  · You see blood in your vomit.  · Your vomit looks like coffee grounds.  · You have bloody or black stools or stools that look like tar.  · You have a severe headache, a stiff neck, or both.  · You have a rash.  · You have severe pain, cramping, or bloating in your abdomen.  · You have trouble breathing or you are breathing very quickly.  · Your heart is beating very quickly.  · Your skin feels cold and clammy.  · You feel confused.  · You have pain when you urinate.  · You have signs of dehydration, such as:  ? Dark urine, very little urine, or no urine.  ? Cracked lips.  ? Dry mouth.  ? Sunken eyes.  ? Sleepiness.  ? Weakness.  This information is not intended to replace advice given to you by your health care provider. Make sure you discuss any questions you have with your health care provider.  Document Released: 03/16/2005 Document Revised: 08/28/2015 Document Reviewed: 11/20/2014  Elsevier Interactive Patient Education © 2018 [employer]

## 2017-07-03 NOTE — MAU Note (Signed)
Pt reports she started having watery diarrhea x 12 since yesterday. Fever up to 102, goes back up after tylenol wears off. Lower back pain, perineal pain since yesterday.

## 2017-07-05 ENCOUNTER — Other Ambulatory Visit: Payer: Self-pay

## 2017-07-05 ENCOUNTER — Inpatient Hospital Stay (HOSPITAL_COMMUNITY)
Admission: AD | Admit: 2017-07-05 | Discharge: 2017-07-07 | DRG: 832 | Disposition: A | Discharge status: Home / Self Care | Payer: 59 | Source: Ambulatory Visit | Attending: Obstetrics and Gynecology | Admitting: Obstetrics and Gynecology

## 2017-07-05 ENCOUNTER — Encounter (HOSPITAL_COMMUNITY): Payer: Self-pay | Admitting: *Deleted

## 2017-07-05 DIAGNOSIS — K529 Noninfective gastroenteritis and colitis, unspecified: Secondary | ICD-10-CM | POA: Diagnosis present

## 2017-07-05 DIAGNOSIS — A09 Infectious gastroenteritis and colitis, unspecified: Secondary | ICD-10-CM | POA: Diagnosis present

## 2017-07-05 DIAGNOSIS — E876 Hypokalemia: Secondary | ICD-10-CM | POA: Diagnosis present

## 2017-07-05 DIAGNOSIS — R197 Diarrhea, unspecified: Secondary | ICD-10-CM | POA: Diagnosis not present

## 2017-07-05 DIAGNOSIS — O219 Vomiting of pregnancy, unspecified: Secondary | ICD-10-CM | POA: Diagnosis not present

## 2017-07-05 DIAGNOSIS — O99613 Diseases of the digestive system complicating pregnancy, third trimester: Principal | ICD-10-CM | POA: Diagnosis present

## 2017-07-05 DIAGNOSIS — O212 Late vomiting of pregnancy: Secondary | ICD-10-CM | POA: Diagnosis present

## 2017-07-05 DIAGNOSIS — O26899 Other specified pregnancy related conditions, unspecified trimester: Secondary | ICD-10-CM | POA: Diagnosis not present

## 2017-07-05 DIAGNOSIS — Z3A34 34 weeks gestation of pregnancy: Secondary | ICD-10-CM

## 2017-07-05 LAB — URINALYSIS, ROUTINE W REFLEX MICROSCOPIC
BILIRUBIN URINE: NEGATIVE
Glucose, UA: NEGATIVE mg/dL
Hgb urine dipstick: NEGATIVE
KETONES UR: 20 mg/dL — AB
Nitrite: NEGATIVE
Protein, ur: 100 mg/dL — AB
Specific Gravity, Urine: 1.029 (ref 1.005–1.030)
pH: 6 (ref 5.0–8.0)

## 2017-07-05 LAB — COMPREHENSIVE METABOLIC PANEL
ALT: 48 U/L (ref 14–54)
AST: 46 U/L — AB (ref 15–41)
Albumin: 2.7 g/dL — ABNORMAL LOW (ref 3.5–5.0)
Alkaline Phosphatase: 80 U/L (ref 38–126)
Anion gap: 8 (ref 5–15)
BUN: 6 mg/dL (ref 6–20)
CHLORIDE: 110 mmol/L (ref 101–111)
CO2: 16 mmol/L — AB (ref 22–32)
CREATININE: 0.79 mg/dL (ref 0.44–1.00)
Calcium: 8.7 mg/dL — ABNORMAL LOW (ref 8.9–10.3)
GFR calc non Af Amer: >60 mL/min (ref >60)
Glucose, Bld: 76 mg/dL (ref 65–99)
Potassium: 3.2 mmol/L — ABNORMAL LOW (ref 3.5–5.1)
SODIUM: 134 mmol/L — AB (ref 135–145)
Total Bilirubin: 0.5 mg/dL (ref 0.3–1.2)
Total Protein: 5.9 g/dL — ABNORMAL LOW (ref 6.5–8.1)

## 2017-07-05 LAB — CBC
HCT: 31.8 % — ABNORMAL LOW (ref 36.0–46.0)
Hemoglobin: 11.3 g/dL — ABNORMAL LOW (ref 12.0–15.0)
MCH: 33.1 pg (ref 26.0–34.0)
MCHC: 35.5 g/dL (ref 30.0–36.0)
MCV: 93.3 fL (ref 78.0–100.0)
PLATELETS: 178 10*3/uL (ref 150–400)
RBC: 3.41 MIL/uL — ABNORMAL LOW (ref 3.87–5.11)
RDW: 13.2 % (ref 11.5–15.5)
WBC: 6 10*3/uL (ref 4.0–10.5)

## 2017-07-05 LAB — C DIFFICILE QUICK SCREEN W PCR REFLEX
C DIFFICILE (CDIFF) TOXIN: NEGATIVE
C Diff antigen: NEGATIVE
C Diff interpretation: NOT DETECTED

## 2017-07-05 MED ORDER — ONDANSETRON HCL 4 MG/2ML IJ SOLN
4.0000 mg | Freq: Once | INTRAMUSCULAR | Status: AC
  Administered 2017-07-05: 4 mg via INTRAVENOUS
  Filled 2017-07-05: qty 2

## 2017-07-05 MED ORDER — ZOLPIDEM TARTRATE 5 MG PO TABS: 5.0000 mg | ORAL_TABLET | Freq: Every evening | ORAL | Status: DC | PRN

## 2017-07-05 MED ORDER — DEXTROSE IN LACTATED RINGERS 5 % IV SOLN
INTRAVENOUS | Status: DC
  Administered 2017-07-05 – 2017-07-06 (×3): via INTRAVENOUS

## 2017-07-05 MED ORDER — LACTATED RINGERS IV BOLUS
1000.0000 mL | Freq: Once | INTRAVENOUS | Status: AC
  Administered 2017-07-05: 1000 mL via INTRAVENOUS

## 2017-07-05 MED ORDER — PRENATAL MULTIVITAMIN CH
1.0000 | ORAL_TABLET | Freq: Every day | ORAL | Status: DC
  Administered 2017-07-06: 1 via ORAL
  Filled 2017-07-05: qty 1

## 2017-07-05 MED ORDER — DOCUSATE SODIUM 100 MG PO CAPS: 100.0000 mg | ORAL_CAPSULE | Freq: Every day | ORAL | Status: DC

## 2017-07-05 MED ORDER — ACETAMINOPHEN 500 MG PO TABS: 1000.0000 mg | ORAL_TABLET | Freq: Four times a day (QID) | ORAL | Status: DC | PRN

## 2017-07-05 MED ORDER — LOPERAMIDE HCL 2 MG PO CAPS
2.0000 mg | ORAL_CAPSULE | Freq: Four times a day (QID) | ORAL | Status: DC | PRN
  Filled 2017-07-05: qty 1

## 2017-07-05 MED ORDER — POTASSIUM CHLORIDE 10 MEQ/100ML IV SOLN
10.0000 meq | INTRAVENOUS | Status: AC
  Administered 2017-07-05 (×2): 10 meq via INTRAVENOUS
  Filled 2017-07-05 (×2): qty 100

## 2017-07-05 MED ORDER — CALCIUM CARBONATE ANTACID 500 MG PO CHEW: 2.0000 | CHEWABLE_TABLET | ORAL | Status: DC | PRN

## 2017-07-05 MED ORDER — ONDANSETRON HCL 4 MG/2ML IJ SOLN
4.0000 mg | Freq: Once | INTRAMUSCULAR | Status: DC
  Filled 2017-07-05: qty 2

## 2017-07-05 MED FILL — ONDANSETRON ODT 8 MG TABLET: 8 | 7 days supply | Qty: 20 | Fill #0

## 2017-07-05 NOTE — H&P (Addendum)
Samantha Norman is a 32 y.o. female presenting for persistent GI illness. For the past week has had daily diarrhea - at least 10 stools/day. Was evaluated in MAU 2 or 2 days ago and after proper IVF/PO challenge was discharged in stable condition. Since then she has slowly worsened with new onset of emesis today - last > 6 hours ago. Voiding less. Still able to tolerate some fluids and crackers but overall just feels weak. No dizziness, CP, SOB, syncope, or problems ambulating. + blood in stool since Saturday. Has been taking zofran and imodium.  No contractions. + FM. No sick contacts.  OB History    Gravida  1   Para  0   Term  0   Preterm  0   AB  0   Living  0     SAB  0   TAB  0   Ectopic  0   Multiple  0   Live Births             Past Medical History:  Diagnosis Date  . Dizziness    "goes along w/my migraines"  . Family history of adverse reaction to anesthesia    "Mom is overweight, has sleep apnea; has breathing issues w/anesthesia"  . History of chicken pox   . History of depression   . 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 Lumbar vertebral fracture ([place]) 2004   "sports related; L5; no OR"  . Migraine headache    "monthly even w/my RX" (05/31/2015)  . Mononucleosis dx'd 05/2015  . Pneumonia ~ 2010  . Sicca syndrome ([place]) 10/04/2015   Sicca syndrome with polyarthralgia. Established with Dr Trudie Reed rheum - glandular without systemic manifestations - continue topical treatment, no need for systemic therapy at this time. ANA neg, ESR 2, CRP 0.6, anti CCP 5 (10/2015)   . Spondylolysis of lumbosacral region 2004   chronic R L5  . Thyroid nodule 01/23/2016   Right lower thyroid gland 6 mm nodule by CT (05/2015)   Past Surgical History:  Procedure Laterality Date  . brain MRI  11/2013   WNL, no demyelinating disease  . evoked brainstem auditory response  2014   WNL ([facility])   Family History: family history includes Alzheimer's disease (age of onset: 24) in her maternal uncle;  CAD (age of onset: 13) in her maternal grandmother; Cancer in her paternal grandfather and paternal uncle; Cancer (age of onset: 45) in her mother; Diabetes in her paternal uncle; Hypertension in her mother; Stroke (age of onset: 75) in her paternal uncle; Stroke (age of onset: 18) in her father; Stroke (age of onset: 19) in her paternal grandmother. Social History:  reports that she has never smoked. She has never used smokeless tobacco. She reports that she does not drink alcohol or use drugs.  Pregnancy uncomplicated thus far  ROS History   Blood pressure 116/66, pulse (!) 127, temperature 98.8 F (37.1 C), temperature source Oral, resp. rate (!) 24, weight 79.5 kg (175 lb 4 oz), last menstrual period 11/09/2016, SpO2 (!) 89 %. Exam Physical Exam  Gen: well appearing, NAD, conversive, A&O x 3 Pulm: CTAB, NWOB CV: tachy to 120s Abd: soft, nontender, gravid Pelvic deferred  Prenatal labs: ABO, Rh:   Antibody:   Rubella:   RPR:    HBsAg:    HIV:    GBS:     Assessment/Plan: 32yo G1P0 @ 34.0 wga p/w diarrhea x 7 days and new onset of emesis, tachycardic to 120s.  Also  low K and ketones in urine. Pt w/o2 sat of 89% on admission but repeat 99% - lungs clear, well appearing, and has NWOB. Given significant dehydration, and persistent symptoms with new onset of emesis, recommend admission for IVF, repletion of electrolytes, and GI consultation. Patient AF - if spikes fever would initiate sepsis work up including [place] consultation in AM - stool cultures sent - Imodium - zofran - IVF w/K+, repeat CMP in AM - FWB reassuring - NST reactive and w/out any decels, plan NST q shift, toco while awake - enteric precautions   Tyson Dense 07/05/2017, 6:45 PM

## 2017-07-05 NOTE — MAU Provider Note (Signed)
Chief Complaint:  Fever; Emesis; Nausea; and Diarrhea   None     HPI: Samantha Norman is a 32 y.o. G1P0000 at 15w0dwho presents to maternity admissions reporting diarrhea x 5 days with onset of n/v today.  The symptoms were associated with fever Saturday and Sunday but fever is now resolved. The n/v is new onset today with emesis x 3-4.  Diarrhea continues x 10 daily and is watery and yellow.  She was seen in MAU on 4/6 and received medications and IV fluids. She is taking Zofran and imodium at home but symptoms are not improving.  She has some abdominal cramping today that is irregular but sometimes painful.  She has not tried any other treatments. There are no other symptoms. She reports good fetal movement, denies LOF, vaginal bleeding, vaginal itching/burning, urinary symptoms, h/a, dizziness, or fever/chills.    HPI  Past Medical History: Past Medical History:  Diagnosis Date  . Dizziness    "goes along w/my migraines"  . Family history of adverse reaction to anesthesia    "Mom is overweight, has sleep apnea; has breathing issues w/anesthesia"  . History of chicken pox   . History of depression   . 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 [place]   "sports related; L5; no OR"  . Migraine headache    "monthly even w/my RX" (05/31/2015)  . Mononucleosis dx'd 05/2015  . Pneumonia ~ 2010  [place]   Sicca syndrome with polyarthralgia. Established with Dr HTrudie Reedrheum - glandular without systemic manifestations - continue topical treatment, no need for systemic therapy at this time. ANA neg, ESR 2, CRP 0.6, anti CCP 5 (10/2015)   . Spondylolysis of lumbosacral region 2004   chronic R L5  . Thyroid nodule 01/23/2016   Right lower thyroid gland 6 mm nodule by CT (05/2015)    Past obstetric history: OB History  Gravida Para Term Preterm AB Living  1 0 0 0 0 0  SAB TAB Ectopic Multiple Live Births  0 0 0 0      # Outcome Date GA Lbr Len/2nd  Weight Sex Delivery Anes PTL Lv  1 Current             Past Surgical History: Past Surgical History:  Procedure Laterality Date  . brain MRI  11/2013   WNL, no demyelinating disease  . evoked brainstem auditory response  2014   WNL ([facility])    Family History: Family History  Problem Relation Age of Onset  . Hypertension Mother   . Cancer Mother 562      breast  . Alzheimer's disease Maternal Uncle 529      early onset  . Cancer Paternal Uncle        prostate  . Cancer Paternal Grandfather        prostate  . Stroke Father 668      unclear cause - small vessel disease  . Stroke Paternal Grandmother [phone]      multiple   . Stroke Paternal Uncle 665 . CAD Maternal Grandmother 80  . Diabetes Paternal Uncle     Social History: Social History   Tobacco Use  . Smoking status: Never Smoker  . Smokeless tobacco: Never Used  Substance Use Topics  . Alcohol use: No    Alcohol/week: 0.0 oz    Comment: occasionally  . Drug use: No    Allergies: No Known Allergies  Meds:  Medications Prior to Admission  Medication Sig Dispense Refill Last Dose  . acetaminophen (TYLENOL) 500 MG tablet Take 1,000 mg by mouth every 6 (six) hours as needed for mild pain or headache.   07/03/2017 at Unknown time  . loperamide (IMODIUM) 2 MG capsule Take 1 capsule (2 mg total) by mouth 4 (four) times daily as needed for diarrhea or loose stools. 12 capsule 0   . ondansetron (ZOFRAN ODT) 8 MG disintegrating tablet Take 1 tablet (8 mg total) by mouth every 8 (eight) hours as needed for nausea or vomiting. 20 tablet 0   . Prenatal Multivit-Min-Fe-FA (PRENATAL VITAMINS PO) Take 1 tablet by mouth daily.    07/02/2017 at Unknown time    ROS:  Review of Systems  Constitutional: Negative for chills, fatigue and fever.  Eyes: Negative for visual disturbance.  Respiratory: Negative for shortness of breath.   Cardiovascular: Negative for chest pain.  Gastrointestinal: Positive for abdominal pain, diarrhea,  nausea and vomiting.  Genitourinary: Positive for pelvic pain. Negative for difficulty urinating, dysuria, flank pain, vaginal bleeding, vaginal discharge and vaginal pain.  Neurological: Negative for dizziness and headaches.  Psychiatric/Behavioral: Negative.      I have reviewed patient's Past Medical Hx, Surgical Hx, Family Hx, Social Hx, medications and allergies.   Physical Exam   Patient Vitals for the past 24 hrs:  BP Temp Temp src Pulse Resp SpO2 Weight  07/05/17 1948 107/69 97.9 F (36.6 C) Oral (!) 102 (!) 21 99 % -  07/05/17 1623 116/66 98.8 F (37.1 C) Oral (!) 127 (!) 24 (!) 89 % 175 lb 4 oz (79.5 kg)   Constitutional: Well-developed, well-nourished female in mild distress.  Cardiovascular: tachycardia noted Respiratory: normal effort GI: Abd soft, non-tender, gravid appropriate for gestational age.  MS: Extremities nontender, no edema, normal ROM Neurologic: Alert and oriented x 4.  GU: Neg CVAT.     FHT:  Baseline 135 , moderate variability, accelerations present, no decelerations Contractions: irregular, mild to palpation   Labs: Results for orders placed or performed during the hospital encounter of 07/05/17 (from the past 24 hour(s))  Urinalysis, Routine w reflex microscopic     Status: Abnormal   Collection Time: 07/05/17  4:03 PM  Result Value Ref Range   Color, Urine AMBER (A) YELLOW   APPearance HAZY (A) CLEAR   Specific Gravity, Urine 1.029 1.005 - 1.030   pH 6.0 5.0 - 8.0   Glucose, UA NEGATIVE NEGATIVE mg/dL   Hgb urine dipstick NEGATIVE NEGATIVE   Bilirubin Urine NEGATIVE NEGATIVE   Ketones, ur 20 (A) NEGATIVE mg/dL   Protein, ur 100 (A) NEGATIVE mg/dL   Nitrite NEGATIVE NEGATIVE   Leukocytes, UA TRACE (A) NEGATIVE   RBC / HPF 0-5 0 - 5 RBC/hpf   WBC, UA 6-30 0 - 5 WBC/hpf   Bacteria, UA FEW (A) NONE SEEN   Squamous Epithelial / LPF 6-30 (A) NONE SEEN   Mucus PRESENT   CBC     Status: Abnormal   Collection Time: 07/05/17  6:00 PM  Result  Value Ref Range   WBC 6.0 4.0 - 10.5 K/uL   RBC 3.41 (L) 3.87 - 5.11 MIL/uL   Hemoglobin 11.3 (L) 12.0 - 15.0 g/dL   HCT 31.8 (L) 36.0 - 46.0 %   MCV 93.3 78.0 - 100.0 fL   MCH 33.1 26.0 - 34.0 pg   MCHC 35.5 30.0 - 36.0 g/dL   RDW 13.2 11.5 - 15.5 %   Platelets 178 150 -  400 K/uL  Comprehensive metabolic panel     Status: Abnormal   Collection Time: 07/05/17  6:00 PM  Result Value Ref Range   Sodium 134 (L) 135 - 145 mmol/L   Potassium 3.2 (L) 3.5 - 5.1 mmol/L   Chloride 110 101 - 111 mmol/L   CO2 16 (L) 22 - 32 mmol/L   Glucose, Bld 76 65 - 99 mg/dL   BUN 6 6 - 20 mg/dL   Creatinine, Ser 0.79 0.44 - 1.00 mg/dL   Calcium 8.7 (L) 8.9 - 10.3 mg/dL   Total Protein 5.9 (L) 6.5 - 8.1 g/dL   Albumin 2.7 (L) 3.5 - 5.0 g/dL   AST 46 (H) 15 - 41 U/L   ALT 48 14 - 54 U/L   Alkaline Phosphatase 80 38 - 126 U/L   Total Bilirubin 0.5 0.3 - 1.2 mg/dL   GFR calc non Af Amer >60 >60 mL/min   GFR calc Af Amer >60 >60 mL/min   Anion gap 8 5 - 15      Imaging:  No results found.  MAU Course/MDM: CBC, CMP LR x 1000 ml, Zofran 4 mg IV Stool cultures ordered NST reviewed and reactive Consult Dr Royston Sinner with presentation, exam findings and test results.  Admit to [facility] for GI consult tomorrow Pt stable at time of transfer  Assessment: 1. Gastroenteritis presumed infectious     Plan: Admit to [facility] GI Consult D5LR @ 125 Zofran and imodium PRN Dr Royston Sinner to see pt  Fatima Blank Certified Nurse-Midwife 07/05/2017 8:01 PM

## 2017-07-05 NOTE — MAU Note (Signed)
Started Friday night, diarrhea.  Was here Sat- had fever 104.  Sunday, was 101 finally broke in the afternoon was 96.  Today was 97.  Diarrhea continues (watery), nausea and vomiting started today.

## 2017-07-06 MED ORDER — KCL-LACTATED RINGERS-D5W 20 MEQ/L IV SOLN
INTRAVENOUS | Status: AC
  Administered 2017-07-06 (×2): via INTRAVENOUS
  Filled 2017-07-06 (×3): qty 1000

## 2017-07-06 NOTE — Progress Notes (Signed)
No new complaints.  Continued diarrhea with 4 episodes this AM.  No nausea after receiving Zofran yesterday.  Tolerating clears.  Active FM.    VSS. AF. K+ 3.2 AST 22->46 ALT 17->48 WBC 6 C.dif negative  FHT Cat I Toco flat  Gen: A&O x 3 Abd: soft, +ttp in b/l upper quads  31yo G1 at 3234w1d with diarrhea -GI consult this AM -Continue clear liquid diet -Stool cx pending -Hypokalemia-IV repletion; recheck labs tomorrow -NST q shift  Mitchel HonourMegan Elaya Droege, DO

## 2017-07-06 NOTE — Plan of Care (Signed)
  Problem: Clinical Measurements: Goal: Ability to maintain clinical measurements within normal limits will improve Outcome: Progressing   Problem: Coping: Goal: Level of anxiety will decrease Outcome: Progressing   Problem: Physical Regulation: Goal: Complications related to the disease process, condition or treatment will be avoided or minimized Outcome: Progressing   Problem: Education: Goal: Knowledge of General Education information will improve Outcome: Progressing   Problem: Pain Management: Goal: Relief or control of pain will improve Outcome: Progressing

## 2017-07-06 NOTE — Progress Notes (Signed)
IP called to possibly take pt off of enteric precautions since C. Diff came back negative.. RN advised to leave pt on precautions until the gastric panel results come back.

## 2017-07-06 NOTE — Consult Note (Signed)
Referring Provider:   Dr. Linda Hedges Primary Care Physician:  Ria Bush, MD Primary Gastroenterologist: None (unassigned)  Reason for Consultation: Diarrhea  HPI: Samantha Norman is a 32 y.o. female who is [redacted] weeks pregnant with no previous significant GI history who, 5 days ago, began having some loose stools as compared to her normal regular bowel habit of one bowel movement daily.  Over the next day, she developed frank diarrhea and over the next couple of days, fever to 104 degrees.  Finally, yesterday, she developed nausea and vomiting, prompting admission to the hospital, where it was noted that her white count was normal at 6000.    Imodium has not really control the diarrhea.    No recent exposure to antibiotics, no suspicious food exposure, no one else around her is sick with a similar illness.  Stool for C. difficile has come back negative and a GI pathogen panel is pending.    There is no family history of inflammatory bowel disease.  The patient passed some blood and mucus with her bowel movements beginning a couple of days into the illness, but that has been resolved over the past day or 2.    At this time, she is still having 10 or more bowel movements per day, but they are really just very small "squirts," with a fair amount of tenesmus symptoms.  She is having some rather diffuse abdominal cramps.  When she does need to pass some stools, it is very urgent.   Past Medical History:  Diagnosis Date  . Dizziness    "goes along w/my migraines"  . Family history of adverse reaction to anesthesia    "Mom is overweight, has sleep apnea; has breathing issues w/anesthesia"  . History of chicken pox   . History of depression   . 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 Lumbar vertebral fracture ([place]) 2004   "sports related; L5; no OR"  . Migraine headache    "monthly even w/my RX" (05/31/2015)  . Mononucleosis dx'd 05/2015  . Pneumonia ~ 2010  . Sicca syndrome ([place]) 10/04/2015   Sicca syndrome with polyarthralgia. Established with Dr Trudie Reed rheum - glandular without systemic manifestations - continue topical treatment, no need for systemic therapy at this time. ANA neg, ESR 2, CRP 0.6, anti CCP 5 (10/2015)   . Spondylolysis of lumbosacral region 2004   chronic R L5  . Thyroid nodule 01/23/2016   Right lower thyroid gland 6 mm nodule by CT (05/2015)    Past Surgical History:  Procedure Laterality Date  . brain MRI  11/2013   WNL, no demyelinating disease  . evoked brainstem auditory response  2014   WNL ([facility])    Prior to Admission medications   Medication Sig Start Date End Date Taking? Authorizing Provider  acetaminophen (TYLENOL) 500 MG tablet Take 1,000 mg by mouth every 6 (six) hours as needed for mild pain or headache.   Yes [provider]  loperamide (IMODIUM) 2 MG capsule Take 1 capsule (2 mg total) by mouth 4 (four) times daily as needed for diarrhea or loose stools. 07/03/17  Yes Keitha Butte, CNM  ondansetron (ZOFRAN ODT) 8 MG disintegrating tablet Take 1 tablet (8 mg total) by mouth every 8 (eight) hours as needed for nausea or vomiting. 07/03/17  Yes Keitha Butte, CNM  Prenatal Multivit-Min-Fe-FA (PRENATAL VITAMINS PO) Take 1 tablet by mouth daily.    Yes [provider]    Current Facility-Administered Medications  Medication Dose Route Frequency  Provider Last Rate Last Dose  . acetaminophen (TYLENOL) tablet 1,000 mg  1,000 mg Oral Q6H PRN Leftwich-Kirby, Kathie Dike, CNM      . calcium carbonate (TUMS - dosed in mg elemental calcium) chewable tablet 400 mg of elemental calcium  2 tablet Oral Q4H PRN Leftwich-Kirby, Kathie Dike, CNM      . dextrose 5 % in lactated ringers infusion   Intravenous Continuous Leftwich-Kirby, Lisa A, CNM   Stopped at 07/06/17 1125  . dextrose 5% in lactated ringers with KCl 20 mEq/L infusion   Intravenous Continuous Morris, Megan, DO 75 mL/hr at 07/06/17 1125    . docusate sodium (COLACE) capsule 100 mg  100 mg  Oral Daily Leftwich-Kirby, Lisa A, CNM      . loperamide (IMODIUM) capsule 2 mg  2 mg Oral QID PRN Leftwich-Kirby, Kathie Dike, CNM      . ondansetron (ZOFRAN) injection 4 mg  4 mg Intravenous Once Leftwich-Kirby, Kathie Dike, CNM      . prenatal multivitamin tablet 1 tablet  1 tablet Oral Q1200 Leftwich-Kirby, Lisa A, CNM   1 tablet at 07/06/17 1126  . zolpidem (AMBIEN) tablet 5 mg  5 mg Oral QHS PRN Leftwich-Kirby, Lisa A, CNM        Allergies as of 07/03/2017  . (No Known Allergies)    Family History  Problem Relation Age of Onset  . Hypertension Mother   . Cancer Mother 3       breast  . Alzheimer's disease Maternal Uncle 42       early onset  . Cancer Paternal Uncle        prostate  . Cancer Paternal Grandfather        prostate  . Stroke Father 77       unclear cause - small vessel disease  . Stroke Paternal Grandmother 76       multiple   . Stroke Paternal Uncle 38  . CAD Maternal Grandmother 80  . Diabetes Paternal Uncle     Social History   Socioeconomic History  . Marital status: Married    Spouse name: Not on file  . Number of children: 0  . Years of education: Pharmacy  . Highest education level: Not on file  Occupational History  . Occupation: Ship broker  Social Needs  . Financial resource strain: Not on file  . Food insecurity:    Worry: Not on file    Inability: Not on file  . Transportation needs:    Medical: Not on file    Non-medical: Not on file  Tobacco Use  . Smoking status: Never Smoker  . Smokeless tobacco: Never Used  Substance and Sexual Activity  . Alcohol use: No    Alcohol/week: 0.0 oz    Comment: occasionally  . Drug use: No  . Sexual activity: Not Currently    Birth control/protection: None  Lifestyle  . Physical activity:    Days per week: Not on file    Minutes per session: Not on file  . Stress: Not on file  Relationships  . Social connections:    Talks on phone: Not on file    Gets together: Not on file    Attends religious  service: Not on file    Active member of club or organization: Not on file    Attends meetings of clubs or organizations: Not on file    Relationship status: Not on file  . Intimate partner violence:    Fear of current or  ex partner: Not on file    Emotionally abused: Not on file    Physically abused: Not on file    Forced sexual activity: Not on file  Other Topics Concern  . Not on file  Social History Narrative   Lives alone, 2 dogs    Occupation: PhD pharmacist at [employer]    Edu: PhD    Activity: walks dogs, plays tennis    Diet: good water, fruits/vegetables regularly    Review of Systems: See HPI  Physical Exam: Vital signs in last 24 hours: Temp:  [97.7 F (36.5 C)-99.5 F (37.5 C)] 98.9 F (37.2 C) (04/09 1640) Pulse Rate:  [87-102] 98 (04/09 1640) Resp:  [18-21] 18 (04/09 1640) BP: (98-111)/(51-70) 101/70 (04/09 1640) SpO2:  [96 %-99 %] 99 % (04/09 1640) Last BM Date: 07/06/17 Pleasant, healthy-appearing Caucasian female, lying in bed in no distress whatsoever.  Husband is at bedside.  She is anicteric without pallor.  No evident skin rashes.  Radial pulse is somewhat rapid but full (on IV fluids).  Abdomen is gravid consistent with gestational age of [redacted] weeks, but without overt tenderness.  Intake/Output from previous day: No intake/output data recorded. Intake/Output this shift: No intake/output data recorded.  Lab Results: Recent Labs    07/05/17 1800  WBC 6.0  HGB 11.3*  HCT 31.8*  PLT 178   BMET Recent Labs    07/05/17 1800  NA 134*  K 3.2*  CL 110  CO2 16*  GLUCOSE 76  BUN 6  CREATININE 0.79  CALCIUM 8.7*   LFT Recent Labs    07/05/17 1800  PROT 5.9*  ALBUMIN 2.7*  AST 46*  ALT 48  ALKPHOS 80  BILITOT 0.5   PT/INR No results for input(s): LABPROT, INR in the last 72 hours.  Studies/Results: No results found.  Impression: Acute diarrheal illness, currently with tenesmus type symptoms suggesting the presence of significant rectal  inflammation.  The differential diagnosis would be an acute self-limited colitis due to infection (for which the fever and associated nausea and vomiting yesterday would support that diagnosis), versus new onset inflammatory bowel disease (most likely distal ulcerative colitis).  I tend to favor this being infectious.  Plan: 1.  I have ordered a regular diet for the patient.  She is desirous of eating, has not had further nausea or vomiting, and I do not think the food exposure will adversely influence whatever process is going on in the colon, although it may result in somewhat more stool output.  2.  Await pathogen panel results, which will hopefully be ready tomorrow.  If this is confirmed to be infectious in character, the patient simply needs more time to recover (most enteric pathogens do not require antibiotic therapy).  3.  If the GI pathogen panel comes back negative and the patient remains symptomatic when seen tomorrow on rounds, I would consider institution of mesalamine suppositories, assuming that that is permissible from the obstetrical standpoint, as empiric therapy for possible ulcerative proctitis.    4.  Although under ideal circumstances, we would be doing sigmoidoscopy with mucosal biopsies in this patient to help clarify the underlying diagnosis, in this patient's case, I would prefer to avoid instrumentation of the pelvis since she is quite near term.  However, if needed, I think we could perform a sigmoidoscopy without need for IV sedation, and with a low likelihood of precipitating premature labor.  5.  We will see the patient again on rounds tomorrow.  In the meantime,  please feel free to call me if you would like to discuss her case in more detail.   LOS: 1 day   Verlisa Vara V  07/06/2017, 6:55 PM   Pager [phone] If no answer or after 5 PM call [phone]

## 2017-07-07 LAB — GASTROINTESTINAL PANEL BY PCR, STOOL (REPLACES STOOL CULTURE)
Adenovirus F40/41: NOT DETECTED
Astrovirus: NOT DETECTED
CRYPTOSPORIDIUM: NOT DETECTED
Campylobacter species: DETECTED — AB
Cyclospora cayetanensis: NOT DETECTED
ENTAMOEBA HISTOLYTICA: NOT DETECTED
Enteroaggregative E coli (EAEC): NOT DETECTED
Enteropathogenic E coli (EPEC): NOT DETECTED
Enterotoxigenic E coli (ETEC): NOT DETECTED
GIARDIA LAMBLIA: NOT DETECTED
Norovirus GI/GII: NOT DETECTED
Plesimonas shigelloides: NOT DETECTED
Rotavirus A: NOT DETECTED
SALMONELLA SPECIES: NOT DETECTED
SHIGELLA/ENTEROINVASIVE E COLI (EIEC): NOT DETECTED
Sapovirus (I, II, IV, and V): NOT DETECTED
Shiga like toxin producing E coli (STEC): NOT DETECTED
VIBRIO CHOLERAE: NOT DETECTED
VIBRIO SPECIES: NOT DETECTED
YERSINIA ENTEROCOLITICA: NOT DETECTED

## 2017-07-07 LAB — COMPREHENSIVE METABOLIC PANEL
ALBUMIN: 2.2 g/dL — AB (ref 3.5–5.0)
ALT: 34 U/L (ref 14–54)
ANION GAP: 5 (ref 5–15)
AST: 37 U/L (ref 15–41)
Alkaline Phosphatase: 67 U/L (ref 38–126)
BUN: 5 mg/dL — ABNORMAL LOW (ref 6–20)
CO2: 21 mmol/L — AB (ref 22–32)
Calcium: 8.3 mg/dL — ABNORMAL LOW (ref 8.9–10.3)
Chloride: 112 mmol/L — ABNORMAL HIGH (ref 101–111)
Creatinine, Ser: 0.65 mg/dL (ref 0.44–1.00)
GFR calc Af Amer: >60 mL/min (ref >60)
GFR calc non Af Amer: >60 mL/min (ref >60)
GLUCOSE: 83 mg/dL (ref 65–99)
POTASSIUM: 3.6 mmol/L (ref 3.5–5.1)
SODIUM: 138 mmol/L (ref 135–145)
Total Bilirubin: 0.5 mg/dL (ref 0.3–1.2)
Total Protein: 5 g/dL — ABNORMAL LOW (ref 6.5–8.1)

## 2017-07-07 LAB — OCCULT BLOOD X 1 CARD TO LAB, STOOL: Fecal Occult Bld: NEGATIVE

## 2017-07-07 NOTE — Progress Notes (Signed)
Discharge instructions given, questions answered, pt states understanding. Signs and given copy 

## 2017-07-07 NOTE — Progress Notes (Signed)
Pt reports feeling better overnight.  Able to tolerate regular diet - no further n/v.  No diarrhea overnight & only 2 stools this am.  No vb or ctx. + FM.  Desires discharge  AF, VSS + FHT Gen - NAD Abd - gravid Ext - NT  GI panel + campylobacter  A/P:  34+2 weeks with infectious colitis (campylobacter) - improved Plan discharge home with f/u next week in office

## 2017-07-08 ENCOUNTER — Inpatient Hospital Stay (HOSPITAL_COMMUNITY): Payer: 59

## 2017-07-08 ENCOUNTER — Observation Stay (HOSPITAL_COMMUNITY)
Admission: AD | Admit: 2017-07-08 | Discharge: 2017-07-10 | Disposition: A | Discharge status: Home / Self Care | Payer: 59 | Source: Ambulatory Visit | Attending: Obstetrics and Gynecology | Admitting: Obstetrics and Gynecology

## 2017-07-08 ENCOUNTER — Encounter (HOSPITAL_COMMUNITY): Payer: Self-pay

## 2017-07-08 DIAGNOSIS — O9989 Other specified diseases and conditions complicating pregnancy, childbirth and the puerperium: Principal | ICD-10-CM | POA: Insufficient documentation

## 2017-07-08 DIAGNOSIS — R1032 Left lower quadrant pain: Secondary | ICD-10-CM | POA: Insufficient documentation

## 2017-07-08 DIAGNOSIS — Z3A34 34 weeks gestation of pregnancy: Secondary | ICD-10-CM | POA: Diagnosis not present

## 2017-07-08 DIAGNOSIS — R319 Hematuria, unspecified: Secondary | ICD-10-CM | POA: Diagnosis not present

## 2017-07-08 DIAGNOSIS — R109 Unspecified abdominal pain: Secondary | ICD-10-CM | POA: Diagnosis present

## 2017-07-08 LAB — URINALYSIS, ROUTINE W REFLEX MICROSCOPIC
BILIRUBIN URINE: NEGATIVE
GLUCOSE, UA: NEGATIVE mg/dL
Ketones, ur: 5 mg/dL — AB
LEUKOCYTES UA: NEGATIVE
NITRITE: NEGATIVE
PH: 5 (ref 5.0–8.0)
Protein, ur: 30 mg/dL — AB
SPECIFIC GRAVITY, URINE: 1.018 (ref 1.005–1.030)

## 2017-07-08 LAB — COMPREHENSIVE METABOLIC PANEL
ALBUMIN: 2.7 g/dL — AB (ref 3.5–5.0)
ALK PHOS: 86 U/L (ref 38–126)
ALT: 37 U/L (ref 14–54)
ANION GAP: 9 (ref 5–15)
AST: 38 U/L (ref 15–41)
BILIRUBIN TOTAL: 0.5 mg/dL (ref 0.3–1.2)
BUN: 8 mg/dL (ref 6–20)
CALCIUM: 9.2 mg/dL (ref 8.9–10.3)
CO2: 22 mmol/L (ref 22–32)
Chloride: 106 mmol/L (ref 101–111)
Creatinine, Ser: 0.76 mg/dL (ref 0.44–1.00)
GFR calc non Af Amer: >60 mL/min (ref >60)
Glucose, Bld: 100 mg/dL — ABNORMAL HIGH (ref 65–99)
POTASSIUM: 4 mmol/L (ref 3.5–5.1)
SODIUM: 137 mmol/L (ref 135–145)
TOTAL PROTEIN: 5.7 g/dL — AB (ref 6.5–8.1)

## 2017-07-08 LAB — CBC
HEMATOCRIT: 30.6 % — AB (ref 36.0–46.0)
HEMOGLOBIN: 10.6 g/dL — AB (ref 12.0–15.0)
MCH: 32.4 pg (ref 26.0–34.0)
MCHC: 34.6 g/dL (ref 30.0–36.0)
MCV: 93.6 fL (ref 78.0–100.0)
Platelets: 259 10*3/uL (ref 150–400)
RBC: 3.27 MIL/uL — ABNORMAL LOW (ref 3.87–5.11)
RDW: 12.9 % (ref 11.5–15.5)
WBC: 7.6 10*3/uL (ref 4.0–10.5)

## 2017-07-08 MED ORDER — DOCUSATE SODIUM 100 MG PO CAPS
100.0000 mg | ORAL_CAPSULE | Freq: Every day | ORAL | Status: DC
  Filled 2017-07-08: qty 1

## 2017-07-08 MED ORDER — MORPHINE SULFATE 2 MG/ML IV SOLN
INTRAVENOUS | Status: DC
  Administered 2017-07-08: via INTRAVENOUS
  Administered 2017-07-09: 9 mg via INTRAVENOUS
  Administered 2017-07-09: 6 mg via INTRAVENOUS
  Administered 2017-07-09: 16.5 mg via INTRAVENOUS
  Filled 2017-07-08: qty 30

## 2017-07-08 MED ORDER — LACTATED RINGERS IV BOLUS
1000.0000 mL | Freq: Once | INTRAVENOUS | Status: AC
  Administered 2017-07-08: 1000 mL via INTRAVENOUS

## 2017-07-08 MED ORDER — PROMETHAZINE HCL 25 MG/ML IJ SOLN
12.5000 mg | Freq: Once | INTRAMUSCULAR | Status: AC
  Administered 2017-07-08: 12.5 mg via INTRAVENOUS
  Filled 2017-07-08: qty 1

## 2017-07-08 MED ORDER — LACTATED RINGERS IV SOLN
INTRAVENOUS | Status: DC
  Administered 2017-07-08 – 2017-07-09 (×3): via INTRAVENOUS

## 2017-07-08 MED ORDER — ONDANSETRON HCL 4 MG/2ML IJ SOLN: 4.0000 mg | Freq: Four times a day (QID) | INTRAMUSCULAR | Status: DC | PRN

## 2017-07-08 MED ORDER — CALCIUM CARBONATE ANTACID 500 MG PO CHEW: 2.0000 | CHEWABLE_TABLET | ORAL | Status: DC | PRN

## 2017-07-08 MED ORDER — DIPHENHYDRAMINE HCL 12.5 MG/5ML PO ELIX
12.5000 mg | ORAL_SOLUTION | Freq: Four times a day (QID) | ORAL | Status: DC | PRN
  Filled 2017-07-08: qty 5

## 2017-07-08 MED ORDER — BUTORPHANOL TARTRATE 1 MG/ML IJ SOLN
1.0000 mg | Freq: Once | INTRAMUSCULAR | Status: AC
  Administered 2017-07-08: 1 mg via INTRAVENOUS
  Filled 2017-07-08: qty 1

## 2017-07-08 MED ORDER — SODIUM CHLORIDE 0.9% FLUSH: 9.0000 mL | INTRAVENOUS | Status: DC | PRN

## 2017-07-08 MED ORDER — ZOLPIDEM TARTRATE 5 MG PO TABS
5.0000 mg | ORAL_TABLET | Freq: Every evening | ORAL | Status: DC | PRN
Start: 2017-07-08 — End: 2017-07-10

## 2017-07-08 MED ORDER — ACETAMINOPHEN 325 MG PO TABS
650.0000 mg | ORAL_TABLET | ORAL | Status: DC | PRN
  Filled 2017-07-08: qty 2

## 2017-07-08 MED ORDER — DIPHENHYDRAMINE HCL 50 MG/ML IJ SOLN: 12.5000 mg | Freq: Four times a day (QID) | INTRAMUSCULAR | Status: DC | PRN

## 2017-07-08 MED ORDER — NALOXONE HCL 0.4 MG/ML IJ SOLN: 0.4000 mg | INTRAMUSCULAR | Status: DC | PRN

## 2017-07-08 MED ORDER — PRENATAL MULTIVITAMIN CH
1.0000 | ORAL_TABLET | Freq: Every day | ORAL | Status: DC
  Filled 2017-07-08: qty 1

## 2017-07-08 MED FILL — NITROFURANTOIN MONO-MCR 100: 100 | 7 days supply | Qty: 14 | Fill #0

## 2017-07-08 NOTE — MAU Note (Signed)
Pt reports that she was discharged yesterday following admission for GI bug. Pt states last night she started having pressure with urination and called her physician today who prescribed Macrobid. States tonight she started having twisting, sharp, radiating left sided flank pain. States she has noticed some blood and pus in urine and decrease in urination today. Has a history of pyelo 12 years ago and states it feels like this. Pt tried heat at home, but did not take Tylenol. Denies and pregnancy complaints. Reports good fetal movement. Pt unable to obtain urine specimen at this time.

## 2017-07-08 NOTE — MAU Provider Note (Addendum)
History     CSN: [id number]  Arrival date and time: 07/08/17 2009   First Provider Initiated Contact with Patient 07/08/17 2041     Chief Complaint  Patient presents with  . Flank Pain   HPI Samantha Norman is a 32 y.o. G1P0000 at 35w3dwho presents with left flank pain. She states she felt a fullness in her bladder last night and was prescribed Macrobid for a presumed UTI this morning. She states throughout the day, the pain in her flank has continued to get worse. She states she has not been able to urinate since this morning. She states when she did urinate, it burned and she saw blood. She denies any abdominal pain, vaginal bleeding or leaking of fluid. Reports good fetal movement.  She was discharged from the hospital yesterday after an admission for infectious colitis. She reports feeling relief from those symptoms.   OB History    Gravida  1   Para  0   Term  0   Preterm  0   AB  0   Living  0     SAB  0   TAB  0   Ectopic  0   Multiple  0   Live Births              Past Medical History:  Diagnosis Date  . Dizziness    "goes along w/my migraines"  . Family history of adverse reaction to anesthesia    "Mom is overweight, has sleep apnea; has breathing issues w/anesthesia"  . History of chicken pox   . History of depression   . 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 [place]bar [place] ([place] 2004   "sports related; L5; no OR"  . Migraine headache    "monthly even w/my RX" (05/31/2015)  . Mononucleosis dx'd 05/2015  . Pneumonia ~ 2010  . [place]   Sicca syndrome with polyarthralgia. Established with Dr HTrudie Reedrheum - glandular without systemic manifestations - continue topical treatment, no need for systemic therapy at this time. ANA neg, ESR 2, CRP 0.6, anti CCP 5 (10/2015)   . Spondylolysis of lumbosacral region 2004   chronic R L5  . Thyroid nodule 01/23/2016   Right lower thyroid gland 6 mm nodule by CT (05/2015)    Past  Surgical History:  Procedure Laterality Date  . brain MRI  11/2013   WNL, no demyelinating disease  . evoked brainstem auditory response  2014   WNL ([facility])    Family History  Problem Relation Age of Onset  . Hypertension Mother   . Cancer Mother 532      breast  . Alzheimer's disease Maternal Uncle 593      early onset  . Cancer Paternal Uncle        prostate  . Cancer Paternal Grandfather        prostate  . Stroke Father 69      unclear cause - small vessel disease  . Stroke Paternal Grandmother 817      multiple   . Stroke Paternal Uncle 665 . CAD Maternal Grandmother 80  . Diabetes Paternal Uncle     Social History   Tobacco Use  . Smoking status: Never Smoker  . Smokeless tobacco: Never Used  Substance Use Topics  . Alcohol use: No    Alcohol/week: 0.0 oz    Comment: occasionally  . Drug use: No    Allergies: No Known Allergies  Medications Prior to Admission  Medication Sig Dispense Refill Last Dose  . acetaminophen (TYLENOL) 500 MG tablet Take 1,000 mg by mouth every 6 (six) hours as needed for mild pain or headache.   07/05/2017 at Unknown time  . ondansetron (ZOFRAN ODT) 8 MG disintegrating tablet Take 1 tablet (8 mg total) by mouth every 8 (eight) hours as needed for nausea or vomiting. 20 tablet 0 07/05/2017 at Unknown time  . Prenatal Multivit-Min-Fe-FA (PRENATAL VITAMINS PO) Take 1 tablet by mouth daily.    Past Week at Unknown time    Review of Systems  Constitutional: Negative.  Negative for fatigue and fever.  HENT: Negative.   Respiratory: Negative.  Negative for shortness of breath.   Cardiovascular: Negative.  Negative for chest pain.  Gastrointestinal: Negative.  Negative for abdominal pain, constipation, diarrhea, nausea and vomiting.  Genitourinary: Positive for difficulty urinating, dysuria and hematuria.  Musculoskeletal: Positive for back pain.  Neurological: Negative.  Negative for dizziness and headaches.   Physical Exam   Blood  pressure 134/85, pulse (!) 103, temperature 97.7 F (36.5 C), temperature source Oral, resp. rate (!) 24, height '5\' 8"'  (1.727 m), weight 183 lb (83 kg), last menstrual period 11/09/2016, SpO2 100 %.  Physical Exam  Nursing note and vitals reviewed. Constitutional: She is oriented to person, place, and time. She appears well-developed and well-nourished. No distress.  HENT:  Head: Normocephalic.  Eyes: Pupils are equal, round, and reactive to light.  Cardiovascular: Normal rate, regular rhythm and normal heart sounds.  Respiratory: Effort normal and breath sounds normal. No respiratory distress.  GI: Soft. Bowel sounds are normal. She exhibits no distension. There is no tenderness. There is no CVA tenderness.  Neurological: She is alert and oriented to person, place, and time.  Skin: Skin is warm and dry.  Psychiatric: She has a normal mood and affect. Her behavior is normal. Judgment and thought content normal.   Fetal Tracing:  Baseline: 150 Variability: moderate Accels: 15x15 Decels: none  Toco: none  Dilation: Closed Effacement (%): Thick Exam by:: Sharolyn Douglas CNM   MAU Course  Procedures Results for orders placed or performed during the hospital encounter of 07/08/17 (from the past 24 hour(s))  CBC     Status: Abnormal   Collection Time: 07/08/17  8:51 PM  Result Value Ref Range   WBC 7.6 4.0 - 10.5 K/uL   RBC 3.27 (L) 3.87 - 5.11 MIL/uL   Hemoglobin 10.6 (L) 12.0 - 15.0 g/dL   HCT 30.6 (L) 36.0 - 46.0 %   MCV 93.6 78.0 - 100.0 fL   MCH 32.4 26.0 - 34.0 pg   MCHC 34.6 30.0 - 36.0 g/dL   RDW 12.9 11.5 - 15.5 %   Platelets 259 150 - 400 K/uL   MDM Patient in extreme discomfort upon arrival. Dr. Matthew Saras consulted and orders received Stadol 48m IV IV LR CBC, CMP Cath UA UKoreaRenal  Patient screaming in pain 30 minutes after stadol administration. Dr. HMatthew Sarascalled back and requested more pain medication. Will give another 151mstadol and 12.5 phenergan IV before  u/s.   Care turned over to [facility] CN[facility] at 2205. CaWende MottCNM 07/08/17 10:05PM  Assessment and Plan  Us[place]oreaenal  Result Date: 07/08/2017 CLINICAL DATA:  [age]/o F; left-sided flank pain and hematuria. Thirty-four weeks pregnant. EXAM: RENAL / URINARY TRACT ULTRASOUND COMPLETE COMPARISON:  None. FINDINGS: Right Kidney: Length: 12.7 cm. Echogenicity within normal limits. No mass or hydronephrosis visualized. Left  Kidney: Length: 13 cm. Echogenicity within normal limits. No mass or hydronephrosis visualized. Bladder: Appears normal for degree of bladder distention. IMPRESSION: Normal renal ultrasound. Electronically Signed   By: Kristine Garbe M.D.   On: 07/08/2017 22:12   Results for orders placed or performed during the hospital encounter of 07/08/17 (from the past 24 hour(s))  CBC     Status: Abnormal   Collection Time: 07/08/17  8:51 PM  Result Value Ref Range   WBC 7.6 4.0 - 10.5 K/uL   RBC 3.27 (L) 3.87 - 5.11 MIL/uL   Hemoglobin 10.6 (L) 12.0 - 15.0 g/dL   HCT 30.6 (L) 36.0 - 46.0 %   MCV 93.6 78.0 - 100.0 fL   MCH 32.4 26.0 - 34.0 pg   MCHC 34.6 30.0 - 36.0 g/dL   RDW 12.9 11.5 - 15.5 %   Platelets 259 150 - 400 K/uL  Comprehensive metabolic panel     Status: Abnormal   Collection Time: 07/08/17  8:51 PM  Result Value Ref Range   Sodium 137 135 - 145 mmol/L   Potassium 4.0 3.5 - 5.1 mmol/L   Chloride 106 101 - 111 mmol/L   CO2 22 22 - 32 mmol/L   Glucose, Bld 100 (H) 65 - 99 mg/dL   BUN 8 6 - 20 mg/dL   Creatinine, Ser 0.76 0.44 - 1.00 mg/dL   Calcium 9.2 8.9 - 10.3 mg/dL   Total Protein 5.7 (L) 6.5 - 8.1 g/dL   Albumin 2.7 (L) 3.5 - 5.0 g/dL   AST 38 15 - 41 U/L   ALT 37 14 - 54 U/L   Alkaline Phosphatase 86 38 - 126 U/L   Total Bilirubin 0.5 0.3 - 1.2 mg/dL   GFR calc non Af Amer >60 >60 mL/min   GFR calc Af Amer >60 >60 mL/min   Anion gap 9 5 - 15  Urinalysis, Routine w reflex microscopic     Status: Abnormal   Collection Time: 07/08/17  9:35 PM   Result Value Ref Range   Color, Urine YELLOW YELLOW   APPearance HAZY (A) CLEAR   Specific Gravity, Urine 1.018 1.005 - 1.030   pH 5.0 5.0 - 8.0   Glucose, UA NEGATIVE NEGATIVE mg/dL   Hgb urine dipstick LARGE (A) NEGATIVE   Bilirubin Urine NEGATIVE NEGATIVE   Ketones, ur 5 (A) NEGATIVE mg/dL   Protein, ur 30 (A) NEGATIVE mg/dL   Nitrite NEGATIVE NEGATIVE   Leukocytes, UA NEGATIVE NEGATIVE   RBC / HPF TOO NUMEROUS TO COUNT 0 - 5 RBC/hpf   WBC, UA 0-5 0 - 5 WBC/hpf   Bacteria, UA RARE (A) NONE SEEN   Squamous Epithelial / LPF 0-5 (A) NONE SEEN   Mucus PRESENT    Ca Oxalate Crys, UA PRESENT   Type and screen [place]     Status: None   Collection Time: 07/08/17 11:10 PM  Result Value Ref Range   ABO/RH(D) A POS    Antibody Screen NEG    Sample Expiration      07/11/2017 Performed at [facility], [address] [place], [place] [address]    Reviewed above results with Dr Matthew Saras Pt has had two doses of Stadol without relief Dr Matthew Saras will admit her for pain control with Morphine PCA Orders placed Hansel Feinstein CNM

## 2017-07-09 ENCOUNTER — Other Ambulatory Visit: Payer: Self-pay

## 2017-07-09 ENCOUNTER — Encounter (HOSPITAL_COMMUNITY): Payer: Self-pay

## 2017-07-09 DIAGNOSIS — R1032 Left lower quadrant pain: Secondary | ICD-10-CM | POA: Diagnosis not present

## 2017-07-09 DIAGNOSIS — R319 Hematuria, unspecified: Secondary | ICD-10-CM | POA: Diagnosis not present

## 2017-07-09 DIAGNOSIS — O2693 Pregnancy related conditions, unspecified, third trimester: Secondary | ICD-10-CM | POA: Diagnosis not present

## 2017-07-09 DIAGNOSIS — Z3A34 34 weeks gestation of pregnancy: Secondary | ICD-10-CM | POA: Diagnosis not present

## 2017-07-09 DIAGNOSIS — O9989 Other specified diseases and conditions complicating pregnancy, childbirth and the puerperium: Secondary | ICD-10-CM | POA: Diagnosis not present

## 2017-07-09 LAB — STOOL CULTURE: E COLI SHIGA TOXIN ASSAY: NEGATIVE

## 2017-07-09 LAB — ABO/RH: ABO/RH(D): A POS

## 2017-07-09 LAB — STOOL CULTURE REFLEX - CMPCXR

## 2017-07-09 LAB — TYPE AND SCREEN
ABO/RH(D): A POS
Antibody Screen: NEGATIVE

## 2017-07-09 LAB — STOOL CULTURE REFLEX - RSASHR

## 2017-07-09 MED ORDER — HYDROMORPHONE HCL 1 MG/ML IJ SOLN: 0.5000 mg | INTRAMUSCULAR | Status: DC | PRN

## 2017-07-09 MED ORDER — ACETAMINOPHEN 325 MG PO TABS
650.0000 mg | ORAL_TABLET | ORAL | Status: DC | PRN
Start: 2017-07-09 — End: 2017-07-10
  Administered 2017-07-09: 650 mg via ORAL

## 2017-07-09 MED ORDER — OXYCODONE HCL 5 MG PO TABS: 10.0000 mg | ORAL_TABLET | ORAL | Status: DC | PRN

## 2017-07-09 NOTE — H&P (Signed)
Samantha Norman is a 32 y.o. female presenting for L flank pain, was D/C recently for gastroenteritis, presents with L flank pain, during eval in MAU, received IV Stadil for [place] study , but cont with severe L flank pain>>>US shows no obstr or obvious stone, but cath UA + Blood>>>adm for pain mgmt . OB History    Gravida  1   Para  0   Term  0   Preterm  0   AB  0   Living  0     SAB  0   TAB  0   Ectopic  0   Multiple  0   Live Births             Past Medical History:  Diagnosis Date  . Dizziness    "goes along w/my migraines"  . Family history of adverse reaction to anesthesia    "Mom is overweight, has sleep apnea; has breathing issues w/anesthesia"  . History of chicken pox   . History of depression   . 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 Lumbar vertebral fracture ([place]) 2004   "sports related; L5; no OR"  . Migraine headache    "monthly even w/my RX" (05/31/2015)  . Mononucleosis dx'd 05/2015  . Pneumonia ~ 2010  . Sicca syndrome ([place]) 10/04/2015   Sicca syndrome with polyarthralgia. Established with Dr Trudie Reed rheum - glandular without systemic manifestations - continue topical treatment, no need for systemic therapy at this time. ANA neg, ESR 2, CRP 0.6, anti CCP 5 (10/2015)   . Spondylolysis of lumbosacral region 2004   chronic R L5  . Thyroid nodule 01/23/2016   Right lower thyroid gland 6 mm nodule by CT (05/2015)   Past Surgical History:  Procedure Laterality Date  . brain MRI  11/2013   WNL, no demyelinating disease  . evoked brainstem auditory response  2014   WNL ([facility])   Family History: family history includes Alzheimer's disease (age of onset: 59) in her maternal uncle; CAD (age of onset: 25) in her maternal grandmother; Cancer in her paternal grandfather and paternal uncle; Cancer (age of onset: 24) in her mother; Diabetes in her paternal uncle; Hypertension in her mother; Stroke (age of onset: 93) in her paternal uncle; Stroke (age of onset: 43) in her  father; Stroke (age of onset: 56) in her paternal grandmother. Social History:  reports that she has never smoked. She has never used smokeless tobacco. She reports that she does not drink alcohol or use drugs.     Maternal Diabetes: No Genetic Screening: Normal Maternal Ultrasounds/Referrals: Normal Fetal Ultrasounds or other Referrals:  None Maternal Substance Abuse:  No Significant Maternal Medications:  None Significant Maternal Lab Results:   Other Comments:  None  ROS History Dilation: Closed Effacement (%): Thick Exam by:: Sharolyn Douglas CNM  Blood pressure 117/78, pulse (!) 101, temperature 97.8 F (36.6 C), temperature source Oral, resp. rate (!) 25, height '5\' 8"'  (1.727 m), weight 183 lb (83 kg), last menstrual period 11/09/2016, SpO2 96 %. Exam Physical Exam  Constitutional: She is oriented to person, place, and time. She appears well-developed and well-nourished.  HENT:  Head: Normocephalic and atraumatic.  Neck: Normal range of motion. Neck supple.  Cardiovascular: Normal rate and regular rhythm.  Respiratory: Effort normal and breath sounds normal.  GI:  Fundus soft FHR 142  Musculoskeletal:  L flank tenderness  Neurological: She is alert and oriented to person, place, and time.  Skin: Skin is warm.  Prenatal labs: ABO, Rh: --/--/A POS, A POS Performed at [facility], [address] [place], [place] [address]  [phone] 2310) Antibody: NEG (04/11 2310) Rubella:   RPR:    HBsAg:    HIV:    GBS:     Assessment/Plan: 53w4dL flank pain, prob kidney stone with hematuria noted MS PCA Strain urine C and [place]4/02/2018, 7:10 AM

## 2017-07-09 NOTE — Progress Notes (Signed)
  S: This AM reports flank pain still present but much improved with PCA. Continues to deny any urinary symptoms. Pt reports easily voiding after [address] (prior to that felt she could not void "like something was blocked her urine from coming out").  Still having occ diarrhea (has infx colitis) but nothing like when she was admitted.  Last emesis overnight and pt felt s/s pain. No current emesis or nausea.  No fever/chills.  No ctxn or LOF. +FM.  O:  AF HR low 100s (baseline) BP WNL RR 19  Gen: well appearing, NAD, conversive Abd: soft, nontender, gravid Back: No CVA tenderness GYN: No blood or LOF on pad  A/P: 32 yo G1P0 @ 34.4 wga recently discharged after being dx w/camphlobacter enteritis p/w new onset L flank pain. Pt w/h/o pyelo. Felt similar but no urinary s/s and pt AF. Reports L flank pain 10/10 on admission with occasional sharp pain into L groin that is now improving. Carbon UA w/out evidence of infxn but has ++ blood. CX was sent. Renal US w/out evidence of hydronephrosis. Suspect kidney stone. Cannot completely exclude atypical pyelonephritis but given clinical presentation (p/w L flank pain radiating into L groin, AF, blood on UA otherwise + leuk/nit, nl WBC), kidney stone more likely.  - strain urine - tylenol prn  - morphine PCA  - use has slowly downtrended: 15 mg overnight but only 6mg  in last 8 hours  - CEFM while morphine PCA in place  - continuous O2 - ctm vs closely and monitor for new onset fever - UCX pending - repeat imaging/labs prn - general diet, cont IVF (dec to 100 cc/hr) for now given continued but improving diarrhea - low threshold to initiate abx if doesn't continue to improve  - uro c/s tomorrow if does not continue to improve   Rosie FateE Javona Bergevin MD

## 2017-07-09 NOTE — Progress Notes (Signed)
Pt has not used PCA in >8 hours. Transition to PO pain meds - tylenol + oxycodone prn. IV dil prn breakthrough pain.    Rosie FateE Shavanna Furnari MD

## 2017-07-09 NOTE — Progress Notes (Signed)
After patient got up to use the bathroom for first time she stated that the burning sensation had gone away. She still is having pain in her left lower quadrant, rating it and 8 or 9.

## 2017-07-10 DIAGNOSIS — O9989 Other specified diseases and conditions complicating pregnancy, childbirth and the puerperium: Secondary | ICD-10-CM | POA: Diagnosis not present

## 2017-07-10 DIAGNOSIS — R1032 Left lower quadrant pain: Secondary | ICD-10-CM | POA: Diagnosis not present

## 2017-07-10 DIAGNOSIS — Z3A34 34 weeks gestation of pregnancy: Secondary | ICD-10-CM | POA: Diagnosis not present

## 2017-07-10 DIAGNOSIS — R319 Hematuria, unspecified: Secondary | ICD-10-CM | POA: Diagnosis not present

## 2017-07-10 DIAGNOSIS — O2693 Pregnancy related conditions, unspecified, third trimester: Secondary | ICD-10-CM | POA: Diagnosis not present

## 2017-07-10 LAB — COMPREHENSIVE METABOLIC PANEL
ALT: 27 U/L (ref 14–54)
ANION GAP: 10 (ref 5–15)
AST: 23 U/L (ref 15–41)
Albumin: 2.4 g/dL — ABNORMAL LOW (ref 3.5–5.0)
Alkaline Phosphatase: 71 U/L (ref 38–126)
BILIRUBIN TOTAL: 0.6 mg/dL (ref 0.3–1.2)
BUN: 7 mg/dL (ref 6–20)
CHLORIDE: 107 mmol/L (ref 101–111)
CO2: 21 mmol/L — ABNORMAL LOW (ref 22–32)
Calcium: 8.1 mg/dL — ABNORMAL LOW (ref 8.9–10.3)
Creatinine, Ser: 0.64 mg/dL (ref 0.44–1.00)
GFR calc Af Amer: >60 mL/min (ref >60)
Glucose, Bld: 76 mg/dL (ref 65–99)
POTASSIUM: 3.5 mmol/L (ref 3.5–5.1)
Sodium: 138 mmol/L (ref 135–145)
TOTAL PROTEIN: 5.2 g/dL — AB (ref 6.5–8.1)

## 2017-07-10 LAB — CULTURE, OB URINE: CULTURE: NO GROWTH

## 2017-07-10 LAB — CBC
HCT: 27.8 % — ABNORMAL LOW (ref 36.0–46.0)
HEMOGLOBIN: 9.6 g/dL — AB (ref 12.0–15.0)
MCH: 32.7 pg (ref 26.0–34.0)
MCHC: 34.5 g/dL (ref 30.0–36.0)
MCV: 94.6 fL (ref 78.0–100.0)
Platelets: 262 10*3/uL (ref 150–400)
RBC: 2.94 MIL/uL — AB (ref 3.87–5.11)
RDW: 13.3 % (ref 11.5–15.5)
WBC: 6 10*3/uL (ref 4.0–10.5)

## 2017-07-10 MED ORDER — OXYCODONE HCL 5 MG PO TABS: 5.0000 mg | ORAL_TABLET | ORAL | 0 refills | Status: DC | PRN

## 2017-07-10 NOTE — Discharge Summary (Signed)
Physician Discharge Summary  Patient ID: Samantha Norman MRN: [id number] DOB/AGE: Jul 03, 1985 32 y.o.  Admit date: 07/08/2017 Discharge date: 07/10/2017  Admission Diagnoses:  Discharge Diagnoses:  Active Problems:   Flank pain   Discharged Condition: good  Hospital Course: 32 yo G1P0 presented @ 85.3 wga recently discharged after being dx w/camphlobacter enteritis p/w new onset L flank pain, UA clean except large blood, and normal renal US. Patient was admitted for pain control and used a PCA for <24 hours. She then had sign improved pain, was taken off the PCA, and required no further pain medicine x >18 hours.  She remained AF, never had leukocytosis, and remained well appearing through-out stay.   Consults: None  Significant Diagnostic Studies: labs:  CBC    Component Value Date/Time   WBC 6.0 07/10/2017 0539   RBC 2.94 (L) 07/10/2017 0539   HGB 9.6 (L) 07/10/2017 0539   HCT 27.8 (L) 07/10/2017 0539   PLT 262 07/10/2017 0539   MCV 94.6 07/10/2017 0539   MCH 32.7 07/10/2017 0539   MCHC 34.5 07/10/2017 0539   RDW 13.3 07/10/2017 0539   LYMPHSABS 0.9 07/03/2017 1045   MONOABS 0.4 07/03/2017 1045   EOSABS 0.0 07/03/2017 1045   BASOSABS 0.0 07/03/2017 1045   Urinalysis    Component Value Date/Time   COLORURINE YELLOW 07/08/2017 2135   APPEARANCEUR HAZY (A) 07/08/2017 2135   LABSPEC 1.018 07/08/2017 2135   PHURINE 5.0 07/08/2017 2135   GLUCOSEU NEGATIVE 07/08/2017 2135   HGBUR LARGE (A) 07/08/2017 2135   BILIRUBINUR NEGATIVE 07/08/2017 2135   BILIRUBINUR + 10/04/2015 1055   KETONESUR 5 (A) 07/08/2017 2135   PROTEINUR 30 (A) 07/08/2017 2135   UROBILINOGEN negative 10/04/2015 1055   UROBILINOGEN 0.2 04/07/2008 1454   NITRITE NEGATIVE 07/08/2017 2135   LEUKOCYTESUR NEGATIVE 07/08/2017 2135     and microbiology: urine culture: growth still pending  Treatments: IV hydration and pain medication  Discharge Exam: Blood pressure 120/71, pulse 98, temperature 97.7 F  (36.5 C), temperature source Oral, resp. rate 18, height 5\' 8"  (1.727 m), weight 83 kg (183 lb), last menstrual period 11/09/2016, SpO2 98 %. General appearance: alert, cooperative and appears stated age Back: symmetric, no curvature. ROM normal. No CVA tenderness. Cardio: regular rate and rhythm, S1, S2 normal, no murmur, click, rub or gallop Pelvic: gravid uterus Extremities: extremities normal, atraumatic, no cyanosis or edema Skin: Skin color, texture, turgor normal. No rashes or lesions  Abd soft, nontender, gravid  Disposition: Discharge disposition: 01-Home or Self Care       Discharge Instructions    Discharge activity:  No Restrictions   Complete by:  As directed    Discharge diet:  No restrictions   Complete by:  As directed    Discharge instructions   Complete by:  As directed    Keep ur appt monday   Fetal Kick Count:  Lie on our left side for one hour after a meal, and count the number of times your baby kicks.  If it is less than 5 times, get up, move around and drink some juice.  Repeat the test 30 minutes later.  If it is still less than 5 kicks in an hour, notify your doctor.   Complete by:  As directed    LABOR:  When conractions begin, you should start to time them from the beginning of one contraction to the beginning  of the next.  When contractions are 5 - 10 minutes apart or less and  have been regular for at least an hour, you should call your health care provider.   Complete by:  As directed    No sexual activity restrictions   Complete by:  As directed    Notify physician for a general feeling that "something is not right"   Complete by:  As directed    Notify physician for bleeding from the vagina   Complete by:  As directed    Notify physician for blurring of vision or spots before the eyes   Complete by:  As directed    Notify physician for chills or fever   Complete by:  As directed    Notify physician for fainting spells, "black outs" or loss of  consciousness   Complete by:  As directed    Notify physician for increase in vaginal discharge   Complete by:  As directed    Notify physician for increase or change in vaginal discharge   Complete by:  As directed    Notify physician for intestinal cramps, with or without diarrhea, sometimes described as "gas pain"   Complete by:  As directed    Notify physician for leaking of fluid   Complete by:  As directed    Notify physician for leaking of fluid   Complete by:  As directed    Notify physician for low, dull backache, unrelieved by heat or Tylenol   Complete by:  As directed    Notify physician for menstrual like cramps   Complete by:  As directed    Notify physician for pain or burning when urinating   Complete by:  As directed    Notify physician for pelvic pressure   Complete by:  As directed    Notify physician for pelvic pressure (sudden increase)   Complete by:  As directed    Notify physician for severe or continued nausea or vomiting   Complete by:  As directed    Notify physician for sudden gushing of fluid from the vagina (with or without continued leaking)   Complete by:  As directed    Notify physician for sudden, constant, or occasional abdominal pain   Complete by:  As directed    Notify physician for uterine contractions.  These may be painless and feel like the uterus is tightening or the baby is  "balling up"   Complete by:  As directed    Notify physician for vaginal bleeding   Complete by:  As directed    Notify physician if baby moving less than usual   Complete by:  As directed    PRETERM LABOR:  Includes any of the follwing symptoms that occur between 20 - [redacted] weeks gestation.  If these symptoms are not stopped, preterm labor can result in preterm delivery, placing your baby at risk   Complete by:  As directed      Allergies as of 07/10/2017   No Known Allergies     Medication List    TAKE these medications   acetaminophen 500 MG tablet Commonly  known as:  TYLENOL Take 1,000 mg by mouth every 6 (six) hours as needed for mild pain or headache.   ondansetron 8 MG disintegrating tablet Commonly known as:  ZOFRAN ODT Take 1 tablet (8 mg total) by mouth every 8 (eight) hours as needed for nausea or vomiting.   oxyCODONE 5 MG immediate release tablet Commonly known as:  Oxy IR/ROXICODONE Take 1 tablet (5 mg total) by mouth every 4 (four) hours as  needed for severe pain.   PRENATAL VITAMINS PO Take 1 tablet by mouth daily.        Signed: Ranae Pila 07/10/2017, 8:32 AM

## 2017-07-10 NOTE — Progress Notes (Signed)

## 2017-07-12 DIAGNOSIS — Z3685 Encounter for antenatal screening for Streptococcus B: Secondary | ICD-10-CM | POA: Diagnosis not present

## 2017-07-12 LAB — OB RESULTS CONSOLE GBS: STREP GROUP B AG: NEGATIVE

## 2017-07-15 NOTE — Discharge Summary (Signed)
Physician Discharge Summary  Patient ID: Samantha Norman MRN: [id number] DOB/AGE: 31/04/1985 31 y.o.  Admit date: 07/05/2017 Discharge date: 07/15/2017  Admission Diagnoses:  diarrhea  Discharge Diagnoses:  Active Problems:   Gastroenteritis presumed infectious   Discharged Condition: stable  Hospital Course: Pt was admitted with diarrhea, n/v.  Given IV hydration and cultures done.  + culture for campylobacter.  Once symptoms improved, pt stable for discharge.  She was ambulating and tolerating a regular diet.    Consults: GI  Significant Diagnostic Studies: labs: cbc, stool culture  Treatments: IV hydration  Discharge Exam: Blood pressure 107/73, pulse (!) 102, temperature (!) 97.5 F (36.4 C), temperature source Oral, resp. rate 16, weight 175 lb 4 oz (79.5 kg), last menstrual period 11/09/2016, SpO2 100 %. Gen - NAD Abd - soft, mildly tender.  No r/g. Ext - NT, no edema Disposition: stable   Allergies as of 07/07/2017   No Known Allergies     Medication List    STOP taking these medications   loperamide 2 MG capsule Commonly known as:  IMODIUM     TAKE these medications   acetaminophen 500 MG tablet Commonly known as:  TYLENOL Take 1,000 mg by mouth every 6 (six) hours as needed for mild pain or headache.   ondansetron 8 MG disintegrating tablet Commonly known as:  ZOFRAN ODT Take 1 tablet (8 mg total) by mouth every 8 (eight) hours as needed for nausea or vomiting.   PRENATAL VITAMINS PO Take 1 tablet by mouth daily.      Follow-up Information    [place], [facility] Schedule an appointment as soon as possible for a visit in 1 week(s).   Contact information: [address] [place] [id number] [phone]           Signed: Zelphia CairoGretchen Daymien Goth 07/15/2017, 12:32 PM

## 2017-08-05 ENCOUNTER — Telehealth (HOSPITAL_COMMUNITY): Payer: Self-pay | Admitting: *Deleted

## 2017-08-05 ENCOUNTER — Encounter (HOSPITAL_COMMUNITY): Payer: Self-pay | Admitting: *Deleted

## 2017-08-05 NOTE — Telephone Encounter (Signed)
Preadmission screen  

## 2017-08-10 DIAGNOSIS — O365930 Maternal care for other known or suspected poor fetal growth, third trimester, not applicable or unspecified: Secondary | ICD-10-CM | POA: Diagnosis not present

## 2017-08-10 DIAGNOSIS — Z3A39 39 weeks gestation of pregnancy: Secondary | ICD-10-CM | POA: Diagnosis not present

## 2017-08-10 DIAGNOSIS — Z3403 Encounter for supervision of normal first pregnancy, third trimester: Secondary | ICD-10-CM | POA: Diagnosis not present

## 2017-08-17 ENCOUNTER — Inpatient Hospital Stay (HOSPITAL_COMMUNITY): Payer: 59 | Admitting: Anesthesiology

## 2017-08-17 ENCOUNTER — Encounter (HOSPITAL_COMMUNITY): Payer: Self-pay

## 2017-08-17 ENCOUNTER — Inpatient Hospital Stay (HOSPITAL_COMMUNITY)
Admission: AD | Admit: 2017-08-17 | Discharge: 2017-08-19 | DRG: 807 | Disposition: A | Discharge status: Home / Self Care | Payer: 59 | Source: Ambulatory Visit | Attending: Obstetrics and Gynecology | Admitting: Obstetrics and Gynecology

## 2017-08-17 DIAGNOSIS — Z3A40 40 weeks gestation of pregnancy: Secondary | ICD-10-CM

## 2017-08-17 DIAGNOSIS — O358XX0 Maternal care for other (suspected) fetal abnormality and damage, not applicable or unspecified: Secondary | ICD-10-CM | POA: Diagnosis not present

## 2017-08-17 DIAGNOSIS — O26893 Other specified pregnancy related conditions, third trimester: Secondary | ICD-10-CM | POA: Diagnosis present

## 2017-08-17 DIAGNOSIS — O480 Post-term pregnancy: Secondary | ICD-10-CM | POA: Diagnosis present

## 2017-08-17 LAB — CBC
HCT: 33.5 % — ABNORMAL LOW (ref 36.0–46.0)
Hemoglobin: 11.3 g/dL — ABNORMAL LOW (ref 12.0–15.0)
MCH: 32.4 pg (ref 26.0–34.0)
MCHC: 33.7 g/dL (ref 30.0–36.0)
MCV: 96 fL (ref 78.0–100.0)
Platelets: 231 10*3/uL (ref 150–400)
RBC: 3.49 MIL/uL — ABNORMAL LOW (ref 3.87–5.11)
RDW: 13.5 % (ref 11.5–15.5)
WBC: 8.9 10*3/uL (ref 4.0–10.5)

## 2017-08-17 LAB — TYPE AND SCREEN
ABO/RH(D): A POS
ANTIBODY SCREEN: NEGATIVE

## 2017-08-17 LAB — RPR: RPR Ser Ql: NONREACTIVE

## 2017-08-17 MED ORDER — SENNOSIDES-DOCUSATE SODIUM 8.6-50 MG PO TABS
2.0000 | ORAL_TABLET | ORAL | Status: DC
  Administered 2017-08-17 – 2017-08-18 (×2): 2 via ORAL
  Filled 2017-08-17 (×2): qty 2

## 2017-08-17 MED ORDER — OXYTOCIN 40 UNITS IN LACTATED RINGERS INFUSION - SIMPLE MED
1.0000 m[IU]/min | INTRAVENOUS | Status: DC
  Administered 2017-08-17: 2 m[IU]/min via INTRAVENOUS
  Filled 2017-08-17: qty 1000

## 2017-08-17 MED ORDER — EPHEDRINE 5 MG/ML INJ
10.0000 mg | INTRAVENOUS | Status: DC | PRN
  Filled 2017-08-17: qty 2

## 2017-08-17 MED ORDER — OXYCODONE-ACETAMINOPHEN 5-325 MG PO TABS: 2.0000 | ORAL_TABLET | ORAL | Status: DC | PRN

## 2017-08-17 MED ORDER — SOD CITRATE-CITRIC ACID 500-334 MG/5ML PO SOLN
30.0000 mL | ORAL | Status: DC | PRN
  Administered 2017-08-17: 30 mL via ORAL
  Filled 2017-08-17: qty 15

## 2017-08-17 MED ORDER — DIPHENHYDRAMINE HCL 25 MG PO CAPS: 25.0000 mg | ORAL_CAPSULE | Freq: Four times a day (QID) | ORAL | Status: DC | PRN

## 2017-08-17 MED ORDER — BISACODYL 10 MG RE SUPP: 10.0000 mg | Freq: Every day | RECTAL | Status: DC | PRN

## 2017-08-17 MED ORDER — PHENYLEPHRINE 40 MCG/ML (10ML) SYRINGE FOR IV PUSH (FOR BLOOD PRESSURE SUPPORT)
80.0000 ug | PREFILLED_SYRINGE | INTRAVENOUS | Status: DC | PRN
  Filled 2017-08-17: qty 5

## 2017-08-17 MED ORDER — ONDANSETRON HCL 4 MG/2ML IJ SOLN: 4.0000 mg | Freq: Four times a day (QID) | INTRAMUSCULAR | Status: DC | PRN

## 2017-08-17 MED ORDER — MISOPROSTOL 25 MCG QUARTER TABLET
25.0000 ug | ORAL_TABLET | ORAL | Status: DC | PRN
  Administered 2017-08-17 (×2): 25 ug via VAGINAL
  Filled 2017-08-17 (×3): qty 1

## 2017-08-17 MED ORDER — ONDANSETRON HCL 4 MG PO TABS: 4.0000 mg | ORAL_TABLET | ORAL | Status: DC | PRN

## 2017-08-17 MED ORDER — OXYTOCIN 40 UNITS IN LACTATED RINGERS INFUSION - SIMPLE MED: 2.5000 [IU]/h | INTRAVENOUS | Status: DC

## 2017-08-17 MED ORDER — LACTATED RINGERS IV SOLN
INTRAVENOUS | Status: DC
  Administered 2017-08-17 (×2): via INTRAVENOUS

## 2017-08-17 MED ORDER — TETANUS-DIPHTH-ACELL PERTUSSIS 5-2.5-18.5 LF-MCG/0.5 IM SUSP: 0.5000 mL | Freq: Once | INTRAMUSCULAR | Status: DC

## 2017-08-17 MED ORDER — MEASLES, MUMPS & RUBELLA VAC ~~LOC~~ INJ: 0.5000 mL | INJECTION | Freq: Once | SUBCUTANEOUS | Status: DC

## 2017-08-17 MED ORDER — LACTATED RINGERS IV SOLN
500.0000 mL | INTRAVENOUS | Status: DC | PRN
  Administered 2017-08-17: 500 mL via INTRAVENOUS

## 2017-08-17 MED ORDER — DIBUCAINE 1 % RE OINT
1.0000 "application " | TOPICAL_OINTMENT | RECTAL | Status: DC | PRN
  Filled 2017-08-17 (×2): qty 28

## 2017-08-17 MED ORDER — ZOLPIDEM TARTRATE 5 MG PO TABS: 5.0000 mg | ORAL_TABLET | Freq: Every evening | ORAL | Status: DC | PRN

## 2017-08-17 MED ORDER — IBUPROFEN 600 MG PO TABS
600.0000 mg | ORAL_TABLET | Freq: Four times a day (QID) | ORAL | Status: DC
  Administered 2017-08-17 – 2017-08-19 (×7): 600 mg via ORAL
  Filled 2017-08-17 (×7): qty 1

## 2017-08-17 MED ORDER — SIMETHICONE 80 MG PO CHEW: 80.0000 mg | CHEWABLE_TABLET | ORAL | Status: DC | PRN

## 2017-08-17 MED ORDER — DIPHENHYDRAMINE HCL 50 MG/ML IJ SOLN: 12.5000 mg | INTRAMUSCULAR | Status: DC | PRN

## 2017-08-17 MED ORDER — FLEET ENEMA 7-19 GM/118ML RE ENEM: 1.0000 | ENEMA | Freq: Every day | RECTAL | Status: DC | PRN

## 2017-08-17 MED ORDER — LACTATED RINGERS IV SOLN
500.0000 mL | Freq: Once | INTRAVENOUS | Status: AC
  Administered 2017-08-17: 500 mL via INTRAVENOUS

## 2017-08-17 MED ORDER — PRENATAL MULTIVITAMIN CH
1.0000 | ORAL_TABLET | Freq: Every day | ORAL | Status: DC
  Filled 2017-08-17: qty 1

## 2017-08-17 MED ORDER — OXYTOCIN BOLUS FROM INFUSION
500.0000 mL | Freq: Once | INTRAVENOUS | Status: AC
  Administered 2017-08-17: 500 mL via INTRAVENOUS

## 2017-08-17 MED ORDER — FENTANYL 2.5 MCG/ML BUPIVACAINE 1/10 % EPIDURAL INFUSION (WH - ANES)
14.0000 mL/h | INTRAMUSCULAR | Status: DC | PRN
  Administered 2017-08-17: 14 mL/h via EPIDURAL
  Filled 2017-08-17: qty 100

## 2017-08-17 MED ORDER — LIDOCAINE HCL (PF) 1 % IJ SOLN
30.0000 mL | INTRAMUSCULAR | Status: DC | PRN
  Filled 2017-08-17: qty 30

## 2017-08-17 MED ORDER — PHENYLEPHRINE 40 MCG/ML (10ML) SYRINGE FOR IV PUSH (FOR BLOOD PRESSURE SUPPORT)
80.0000 ug | PREFILLED_SYRINGE | INTRAVENOUS | Status: DC | PRN
  Filled 2017-08-17: qty 10
  Filled 2017-08-17: qty 5

## 2017-08-17 MED ORDER — ACETAMINOPHEN 325 MG PO TABS: 650.0000 mg | ORAL_TABLET | ORAL | Status: DC | PRN

## 2017-08-17 MED ORDER — LIDOCAINE HCL (PF) 1 % IJ SOLN
INTRAMUSCULAR | Status: DC | PRN
  Administered 2017-08-17: 13 mL via EPIDURAL

## 2017-08-17 MED ORDER — ONDANSETRON HCL 4 MG/2ML IJ SOLN: 4.0000 mg | INTRAMUSCULAR | Status: DC | PRN

## 2017-08-17 MED ORDER — COCONUT OIL OIL
1.0000 "application " | TOPICAL_OIL | Status: DC | PRN
  Filled 2017-08-17: qty 120

## 2017-08-17 MED ORDER — OXYCODONE-ACETAMINOPHEN 5-325 MG PO TABS: 1.0000 | ORAL_TABLET | ORAL | Status: DC | PRN

## 2017-08-17 MED ORDER — WITCH HAZEL-GLYCERIN EX PADS: 1.0000 "application " | MEDICATED_PAD | CUTANEOUS | Status: DC | PRN

## 2017-08-17 MED ORDER — MEDROXYPROGESTERONE ACETATE 150 MG/ML IM SUSP: 150.0000 mg | INTRAMUSCULAR | Status: DC | PRN

## 2017-08-17 MED ORDER — TERBUTALINE SULFATE 1 MG/ML IJ SOLN
0.2500 mg | Freq: Once | INTRAMUSCULAR | Status: DC | PRN
  Filled 2017-08-17: qty 1

## 2017-08-17 MED ORDER — BENZOCAINE-MENTHOL 20-0.5 % EX AERO
1.0000 "application " | INHALATION_SPRAY | CUTANEOUS | Status: DC | PRN
  Administered 2017-08-17: 1 via TOPICAL
  Filled 2017-08-17: qty 56

## 2017-08-17 NOTE — Anesthesia Pain Management Evaluation Note (Signed)
  [facility] Pain Management Visit Note  Patient: Samantha Norman, 32 y.o., female  "Hello I am a member of the anesthesia team at [facility]. We have an anesthesia team available at all times to provide care throughout the hospital, including epidural management and anesthesia for C-section. I don't know your plan for the delivery whether it a natural birth, water birth, IV sedation, nitrous supplementation, doula or epidural, but we want to meet your pain goals."   1.Was your pain managed to your expectations on prior hospitalizations?   No prior hospitalizations for labor.  2.What is your expectation for pain management during this hospitalization?     Epidural  3.How can we help you reach that goal? Epidural at pain threshold  Record the patient's initial score and the patient's pain goal.   Pain: 3  Pain Goal: 8 [facility] wants you to be able to say your pain was always managed very well.  Jailan Trimm 08/17/2017

## 2017-08-17 NOTE — H&P (Signed)
Samantha Norman is a 32 y.o. G 1 P 0 at 14 w 1 day  Presents for IOL  Status post cytotec OB History    Gravida  1   Para  0   Term  0   Preterm  0   AB  0   Living  0     SAB  0   TAB  0   Ectopic  0   Multiple  0   Live Births             Past Medical History:  Diagnosis Date  . Dizziness    "goes along w/my migraines"  . Family history of adverse reaction to anesthesia    "Mom is overweight, has sleep apnea; has breathing issues w/anesthesia"  . History of chicken pox   . History of depression   . 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 Lumbar vertebral fracture ([place]) 2004   "sports related; L5; no OR"  . Migraine headache    "monthly even w/my RX" (05/31/2015)  . Mononucleosis dx'd 05/2015  . Pneumonia ~ 2010  . Sicca syndrome ([place]) 10/04/2015   Sicca syndrome with polyarthralgia. Established with Dr Trudie Reed rheum - glandular without systemic manifestations - continue topical treatment, no need for systemic therapy at this time. ANA neg, ESR 2, CRP 0.6, anti CCP 5 (10/2015)   . Spondylolysis of lumbosacral region 2004   chronic R L5  . Thyroid nodule 01/23/2016   Right lower thyroid gland 6 mm nodule by CT (05/2015)   Past Surgical History:  Procedure Laterality Date  . brain MRI  11/2013   WNL, no demyelinating disease  . evoked brainstem auditory response  2014   WNL ([facility])   Family History: family history includes Alzheimer's disease (age of onset: 24) in her maternal uncle; CAD (age of onset: 10) in her maternal grandmother; Cancer in her paternal grandfather and paternal uncle; Cancer (age of onset: 75) in her mother; Diabetes in her paternal uncle; Hypertension in her father and mother; Stroke (age of onset: 33) in her paternal uncle; Stroke (age of onset: 42) in her father; Stroke (age of onset: 55) in her paternal grandmother. Social History:  reports that she has never smoked. She has never used smokeless tobacco. She reports that she does not drink alcohol or  use drugs.     Maternal Diabetes: No Genetic Screening: Normal Maternal Ultrasounds/Referrals: Abnormal:  Findings:   Fetal Kidney Anomalies Fetal Ultrasounds or other Referrals:  None Maternal Substance Abuse:  No Significant Maternal Medications:  None Significant Maternal Lab Results:  None Other Comments:  None  Review of Systems  All other systems reviewed and are negative.  History Dilation: 2 Effacement (%): 90 Station: -2 Exam by:: Dr Helane Rima  Blood pressure 133/81, pulse 78, temperature 97.6 F (36.4 C), temperature source Oral, resp. rate 16, height '5\' 8"'  (1.727 m), weight 85.2 kg (187 lb 14.4 oz), last menstrual period 11/09/2016. Maternal Exam:  Abdomen: Fetal presentation: vertex     Fetal Exam Fetal State Assessment: Category I - tracings are normal.     Physical Exam  Nursing note and vitals reviewed. Constitutional: She appears well-developed and well-nourished.  HENT:  Head: Normocephalic.  Eyes: Pupils are equal, round, and reactive to light.  Neck: Normal range of motion.  Cardiovascular: Normal rate and regular rhythm.  Respiratory: Effort normal.    Prenatal labs: ABO, Rh: --/--/A POS (05/21 0100) Antibody: NEG (05/21 0100) Rubella: Immune (10/12 0000) RPR: Nonreactive (10/12 0000)  HBsAg: Negative (10/12 0000)  HIV: Non-reactive (10/12 0000)  GBS: Negative (04/15 0000)   Assessment/Plan: IUP at 40 w 1 day IOL Pitocin prn Epidural prn  Philana Younis L 08/17/2017, 7:56 AM

## 2017-08-17 NOTE — Anesthesia Preprocedure Evaluation (Signed)
Anesthesia Evaluation  Patient identified by MRN, date of birth, ID band Patient awake    Reviewed: Allergy & Precautions, NPO status , Patient's Chart, lab work & pertinent test results  Airway Mallampati: II  TM Distance: >3 FB Neck ROM: Full    Dental no notable dental hx.    Pulmonary neg pulmonary ROS,    Pulmonary exam normal breath sounds clear to auscultation       Cardiovascular negative cardio ROS Normal cardiovascular exam Rhythm:Regular Rate:Normal     Neuro/Psych  Headaches, negative neurological ROS  negative psych ROS   GI/Hepatic negative GI ROS, Neg liver ROS,   Endo/Other  negative endocrine ROS  Renal/GU negative Renal ROS  negative genitourinary   Musculoskeletal negative musculoskeletal ROS (+)   Abdominal   Peds negative pediatric ROS (+)  Hematology negative hematology ROS (+)   Anesthesia Other Findings   Reproductive/Obstetrics negative OB ROS (+) Pregnancy                             Anesthesia Physical Anesthesia Plan  ASA: II  Anesthesia Plan: Epidural   Post-op Pain Management:    Induction:   PONV Risk Score and Plan:   Airway Management Planned:   Additional Equipment:   Intra-op Plan:   Post-operative Plan:   Informed Consent:   Plan Discussed with:   Anesthesia Plan Comments:         Anesthesia Quick Evaluation  

## 2017-08-17 NOTE — Anesthesia Procedure Notes (Signed)
Epidural Patient location during procedure: OB Start time: 08/17/2017 8:58 AM End time: 08/17/2017 9:12 AM  Staffing Anesthesiologist: Lowella Curb, MD Performed: anesthesiologist   Preanesthetic Checklist Completed: patient identified, site marked, surgical consent, pre-op evaluation, timeout performed, IV checked, risks and benefits discussed and monitors and equipment checked  Epidural Patient position: sitting Prep: ChloraPrep Patient monitoring: heart rate, cardiac monitor, continuous pulse ox and blood pressure Approach: midline Location: L2-L3 Injection technique: LOR saline  Needle:  Needle type: Tuohy  Needle gauge: 17 G Needle length: 9 cm Needle insertion depth: 5 cm Catheter type: closed end flexible Catheter size: 20 Guage Catheter at skin depth: 9 cm Test dose: negative  Assessment Events: blood not aspirated, injection not painful, no injection resistance, negative IV test and no paresthesia  Additional Notes Reason for block:procedure for pain

## 2017-08-18 LAB — CBC
HCT: 29.6 % — ABNORMAL LOW (ref 36.0–46.0)
Hemoglobin: 9.7 g/dL — ABNORMAL LOW (ref 12.0–15.0)
MCH: 31.8 pg (ref 26.0–34.0)
MCHC: 32.8 g/dL (ref 30.0–36.0)
MCV: 97 fL (ref 78.0–100.0)
PLATELETS: 184 10*3/uL (ref 150–400)
RBC: 3.05 MIL/uL — ABNORMAL LOW (ref 3.87–5.11)
RDW: 13.5 % (ref 11.5–15.5)
WBC: 15.6 10*3/uL — AB (ref 4.0–10.5)

## 2017-08-18 LAB — RUBELLA SCREEN: RUBELLA: 2.87 {index} (ref >0.99)

## 2017-08-18 NOTE — Lactation Note (Signed)
This note was copied from a baby's chart. Lactation Consultation Note  Patient Name: Girl Jannifer Fischler [id number] Date: 08/18/2017 Reason for consult: Follow-up assessment Observed mom latch baby using cradle hold.  Baby latched easily.  Mom comfortable with feeding.  Baby actively sucking with swallows noted.  Discussed cluster feeding.  Instructed to feed with cues and call for assist prn.  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  Maternal Data    Feeding Feeding Type: Breast Fed Length of feed: 30 min  LATCH Score Latch: Grasps breast easily, tongue down, lips flanged, rhythmical sucking.  Audible Swallowing: A few with stimulation  Type of Nipple: Everted at rest and after stimulation  Comfort (Breast/Nipple): Soft / non-tender  Hold (Positioning): No assistance needed to correctly position infant at breast.  LATCH Score: 9  Interventions Interventions: Breast feeding basics reviewed;Assisted with latch;Skin to skin;Breast compression;Support pillows;Adjust position  Lactation Tools Discussed/Used     Consult Status Consult Status: Follow-up Date: 08/19/17 Follow-up type: In-patient    Huston Foley 08/18/2017, 1:51 PM

## 2017-08-18 NOTE — Progress Notes (Signed)
Post Partum Day 1 Subjective: no complaints, up ad lib, voiding, tolerating PO and + flatus  Objective: Blood pressure 118/71, pulse 83, temperature 98.1 F (36.7 C), temperature source Oral, resp. rate 20, height  (1.727 m), weight 187 lb 14.4 oz (85.2 kg), last menstrual period 11/09/2016, SpO2 99 %, unknown if currently breastfeeding.  Physical Exam:  General: alert, cooperative and no distress Lochia: appropriate Uterine Fundus: firm Incision: healing well DVT Evaluation: No evidence of DVT seen on physical exam.  Recent Labs    08/17/17 0100 08/18/17 0533  HGB 11.3* 9.7*  HCT 33.5* 29.6*    Assessment/Plan: Plan for discharge tomorrow   LOS: 1 day   Roselle Locus II 08/18/2017, 8:18 AM

## 2017-08-18 NOTE — Lactation Note (Signed)
This note was copied from a baby's chart. Lactation Consultation Note Baby 18 hrs old. Mom states baby hasn't been having long feeding. Has been getting on breast and holding nipple in mouth. Baby had a good feed for about 10 min. For last feeding.  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  Newborn behavior, feeding habits, STS, I&O, cluster feeding discussed. Mom encouraged to feed baby 8-12 times/24 hours and with feeding cues.  Mom has short shaft nipples. Hand expressed dot of thick colostrum to tip of nipple.  Mom has Sicca disease. Mom really tired. Encouraged mom to call for assistance today or tonight. WH/LC brochure given w/resources, support groups and LC services.   Patient Name: Samantha Norman [id number] Date: 08/18/2017 Reason for consult: Initial assessment   Maternal Data Has patient been taught Hand Expression?: Yes Does the patient have breastfeeding experience prior to this delivery?: No  Feeding Feeding Type: Breast Fed Length of feed: 6 min(error corrected)  LATCH Score Latch: Too sleepy or reluctant, no latch achieved, no sucking elicited.  Audible Swallowing: A few with stimulation  Type of Nipple: Everted at rest and after stimulation  Comfort (Breast/Nipple): Soft / non-tender  Hold (Positioning): Assistance needed to correctly position infant at breast and maintain latch.  LATCH Score: 7  Interventions Interventions: Breast feeding basics reviewed;Skin to skin;Breast massage;Hand express;Breast compression  Lactation Tools Discussed/Used WIC Program: No   Consult Status Consult Status: Follow-up Date: 08/18/17 Follow-up type: In-patient    Charyl Dancer 08/18/2017, 6:54 AM

## 2017-08-18 NOTE — Anesthesia Postprocedure Evaluation (Signed)
Anesthesia Post Note  Patient: Alexxis Mackert  Procedure(s) Performed: AN AD HOC LABOR EPIDURAL     Patient location during evaluation: Mother Baby Anesthesia Type: Epidural Level of consciousness: awake and alert and oriented Pain management: satisfactory to patient Vital Signs Assessment: post-procedure vital signs reviewed and stable Respiratory status: respiratory function stable Cardiovascular status: stable Postop Assessment: no headache, no backache, epidural receding, patient able to bend at knees, no signs of nausea or vomiting and adequate PO intake Anesthetic complications: no    Last Vitals:  Vitals:   08/17/17 2045 08/18/17 0530  BP: 98/65 118/71  Pulse: 80 83  Resp: 18 20  Temp:  36.7 C  SpO2:      Last Pain:  Vitals:   08/18/17 0530  TempSrc: Oral  PainSc: 2    Pain Goal: Patients Stated Pain Goal: 3 (08/17/17 1645)               Zaina Jenkin

## 2017-08-18 NOTE — Progress Notes (Signed)
[name] was referred for history of depression/anxiety. * Referral screened out by Clinical Social Worker because none of the following criteria appear to apply: ~ History of anxiety/depression during this pregnancy, or of post-partum depression. ~ Diagnosis of anxiety and/or depression within last 3 years; Per OB records [name]'s depression was situational.  OR * [name]'s symptoms currently being treated with medication and/or therapy.  Please contact the Clinical Social Worker if needs arise, by [name] request, or if [name] scores greater than 9/yes to question 10 on Edinburgh Postpartum Depression Screen.  Edie Darley Boyd-Gilyard, MSW, LCSW Clinical Social Work [phone] 

## 2017-08-19 NOTE — Discharge Summary (Signed)
Obstetric Discharge Summary Reason for Admission: induction of labor Prenatal Procedures: none Intrapartum Procedures: spontaneous vaginal delivery Postpartum Procedures: none Complications-Operative and Postpartum: none Hemoglobin  Date Value Ref Range Status  08/18/2017 9.7 (L) 12.0 - 15.0 g/dL Final   HCT  Date Value Ref Range Status  08/18/2017 29.6 (L) 36.0 - 46.0 % Final    Physical Exam:  General: alert Lochia: appropriate Uterine Fundus: firm Incision: healing well DVT Evaluation: No evidence of DVT seen on physical exam.  Discharge Diagnoses: Term Pregnancy-delivered  Discharge Information: Date: 08/19/2017 Activity: pelvic rest Diet: routine Medications: PNV and Ibuprofen Condition: stable Instructions: refer to practice specific booklet Discharge to: home Follow-up Information    [place], [facility] Schedule an appointment as soon as possible for a visit in 6 week(s).   Contact information: [address] [place] [address] [phone]           Newborn Data: Live born female  Birth Weight: 8 lb (3630 g) APGAR: 8, 9  Newborn Delivery   Birth date/time:  08/17/2017 12:40:00 Delivery type:  Vaginal, Spontaneous     Home with mother.  Jamaiyah Pyle Milana Obey 08/19/2017, 8:10 AM

## 2017-08-19 NOTE — Lactation Note (Signed)
This note was copied from a baby's chart. Lactation Consultation Note; Mom reports that baby has been nursing well LS by RN 9. Good output. Did cluster feed through the night. Reports breasts feel slightly heavier this morning. Reviewed engorgement prevention and treatment. Has Medela pump for home. Reviewed our phone number, OP appointments and BFSG as resources for support after DC. Asking about when to start bottle feeding EBM- reviewed waiting about 3 Yianni Skilling with parents. Going back to work at [address] [employer]. No further questions at present To call prn.  Patient Name: Samantha Norman [id number] Date: 08/19/2017 Reason for consult: Follow-up assessment   Maternal Data Formula Feeding for Exclusion: No Has patient been taught Hand Expression?: Yes Does the patient have breastfeeding experience prior to this delivery?: No  Feeding Feeding Type: Breast Fed Length of feed: 5 min  LATCH Score       Type of Nipple: Everted at rest and after stimulation  Comfort (Breast/Nipple): Soft / non-tender        Interventions Interventions: Coconut oil  Lactation Tools Discussed/Used WIC Program: No   Consult Status Consult Status: Complete    Pamelia Hoit 08/19/2017, 7:38 AM

## 2017-08-19 NOTE — Progress Notes (Signed)
Discharge instructions provided, questions answered, pt states understanding, signed and given copy 

## 2017-08-19 NOTE — Lactation Note (Signed)
This note was copied from a baby's chart. Lactation Consultation Note; Asked by Ped to observe a feeding. Baby sleepy- reviewed waking techniques with parents. Baby undressed and placed skin to skin. Mom easily able to hand express Colostrum. Baby latched well and nursed for 15 min. Latched to other breast and still nursing as I left room. Encouraged to wake baby to feed at least q 3 hours and to feed with feeding cues. Encouragement given. No questions at present.   Patient Name: Samantha Norman [id number] Date: 08/19/2017 Reason for consult: Follow-up assessment   Maternal Data Formula Feeding for Exclusion: No Has patient been taught Hand Expression?: Yes Does the patient have breastfeeding experience prior to this delivery?: No  Feeding Feeding Type: Breast Fed  LATCH Score Latch: Grasps breast easily, tongue down, lips flanged, rhythmical sucking.  Audible Swallowing: A few with stimulation  Type of Nipple: Everted at rest and after stimulation  Comfort (Breast/Nipple): Soft / non-tender  Hold (Positioning): Assistance needed to correctly position infant at breast and maintain latch.  LATCH Score: 8  Interventions Interventions: Coconut oil  Lactation Tools Discussed/Used WIC Program: No   Consult Status Consult Status: Complete    Pamelia Hoit 08/19/2017, 9:12 AM

## 2017-08-20 ENCOUNTER — Other Ambulatory Visit: Payer: Self-pay | Admitting: *Deleted

## 2017-08-20 NOTE — Patient Outreach (Signed)
[facility]) Care Management  08/20/2017  Samantha Norman 1985-10-27 [id number]   Subjective: Telephone call to patient's home / mobile number, spoke with patient, and HIPAA verified.  Discussed [facility] Care Management UMR Transition of care follow up, patient voiced understanding, and is in agreement to follow up.    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 States she has a follow up appointment with obstetrician on 10/01/17. Patient voices understanding of medical diagnosis and treatment plan.   Patient states she is able to manage self care and has assistance as needed with activities of daily living / home management. States she is accessing the following [facility] benefits: outpatient pharmacy, hospital indemnity, and has family medical leave act (FMLA) in place.  Patient states she does not have any education material, transition of care, care coordination, disease management, disease monitoring, transportation, community resource, or pharmacy needs at this time. States she is very appreciative of the follow up and is in agreement to receive [facility] Care Management information.     Objective:  Per KPN (Knowledge Performance Now, point of care tool) and chart review,  Patient hospitalized 08/17/17 -08/19/17 for induction, and spontaneous vaginal delivery of female.   Patient also has a history of  Thyroid nodule and Ludwig's angina.     Assessment: Received UMR Transition of care referral on 08/18/17.    Transition of care follow up completed, no care management needs, and will proceed with case closure.        Plan: RNCM will send patient successful outreach letter, [facility] pamphlet, and magnet. RNCM will complete case closure due to follow up completed / no care management needs.         Samantha Norman, BSN, CCM [facility] Care Management [facility] Telephonic CM Phone: [phone] Fax:  [phone]

## 2017-08-31 ENCOUNTER — Ambulatory Visit: Payer: Self-pay

## 2017-08-31 NOTE — Lactation Note (Signed)
This note was copied from a baby's chart. 08/31/2017  Name: Samantha Norman MRN: [id number] Date of Birth: 08/17/2016 Gestational Age: Gestational Age: [redacted]w[redacted]d Birth Weight: 128 oz Weight today:    8 pounds 2.5 ounces (3700 grams) with clean newborn diaper   Samantha Norman has gained 375 grams in the last 11 days with an average daily weight gain of 34 grams a day. Infant lost down to 7 pounds 1 ounce per mom at which time formula was added.   Samantha Norman presents today with mom for feeding assessment.   Mom reports BF is not going well. She reports infant does not latch well and mom now has Mastitis that started on Sunday 6/2. Mom is on Keflex. Mom is noted to have redness to 2/3 of the breast mainly on the bottom half. Mom is noted to have small lumps to the area. Mom is hand expressing, pumping every 3 hours, taking warm showers, warm compresses before feedings to try and relieve engorgement/plug. Mom is only able to get a few gtts from the right breast. She is pumping with Medela PIS every 3 hours and gets 2-4 ounces from the left breast. Mom reports fever is gone and pain to breast has lessened.   Infant is feeding every 2-3 hours with Dr. Theora Gianotti Bottle and taking 2-3 ounces at a feeding. Infant is getting 2-4 ounces of formula a day.   Reviewed with mom that if Mastitis does not resolve, that she should call her OB back to evaluate for abcess.   Infant was latched to the right breast in the cross cradle hold with the # 24 Nipple Shield. Infant fed for about 5 minutes and transferred 4 ml, milk was in the Nipple Shield. Mom denied pain with feeding. Worked with mom on applying NS.   We then latched infant to left breast in the cross cradle hold. Infant latched easily without the NS and fed for about . Mom did well with stimulating infant as needed with feeding. Infant transferred 62 ml at the breast, she became very sleepy so was removed from the breast to awaken her more.  Infant then latched to the  right breast in the cross cradle hold. She got sleepy really quickly and transferred 2 ml.   She relatched to the left breast for a brief period and transferred an additional 4 ml.   Infant with thick labial frenulum that inserts at the bottom of the gum ridge with a slight divot to the center of the gum ridge, upper lip is tight with flanging and blanching noted. Enc mom to make sure infant's upper lip is flanged with feeding. Infant with posterior lingual frenulum noted with manual exam, she had good tongue mobility noted today. Mom was shown upper lip and enc to flange lip with feeding.   Reviewed with mom the importance of getting right breast opened, mom very aware and has tried hard to pump the breast. Enc mom to try # 27 flange to the right breast today to see if it helps flow. Reviewed with mom that her supply may or may not recover on the effected side, only time will tell. Mom aware to finish all ATB even if feeling better.   Infant to follow up with Dr. Early Osmond tomorrow. [facility] is scheduled to come out on Monday June 10th. Mom aware of BF Support Groups. Mom to follow up with Lactation in 1 week per mom and LC.   Mom reports all questions/concerns have been answered. Mom  to call with questions/concerns as needed.    General Information: Mother's reason for visit: Bf is not going well, infant with weight loss, started formula, does not latch well Consult: Initial Lactation consultant: Noralee Stain RN,IBCLC Breastfeeding experience: Not going well, will not latch often and has Mastitis since Sundsy 6/2 Maternal medical conditions: Thyroid(Thyroid nodule) Maternal medications: Pre-natal vitamin, Motrin (ibuprofen)(Keflex every 6 hours)  Breastfeeding History: Frequency of breast feeding: 1 x a few days Duration of feeding: few minutes  Supplementation: Supplement method: bottle(Dr. Brown's ) Brand: Enfamil Formula volume: 2-4 ounces a day Formula frequency: 1-2 x a day    Breast milk volume: 2-3 ounces Breast milk frequency: every 2-3 hours on demand   Pump type: Medela pump in style Pump frequency: every 2-3 hours Pump volume: 2-4 ounces  Infant Output Assessment: Voids per 24 hours: 8+ Urine color: Clear yellow Stools per 24 hours: 5-6  Stool color: Brown  Breast Assessment: Breast: Filling, Engorged(right breast with mastitis, redness and engorgement) Nipple: Erect Pain level: 3(sensitivity with initial latch and with shower, goes away with feeding) Pain interventions: Bra, Coconut oil  Feeding Assessment: Infant oral assessment: Variance Infant oral assessment comment: Infant with thick labial frenulum that inserts at the bottom of the gum ridge with a slight divot to the center of the gum ridge, upper lip is tight with flanging and blanching noted. Enc mom to make sure infant's upper lip is flanged with feeding. Infant with posterior lingual frenulum noted with manual exam, she had good tongue mobility noted today. Mom was shown upper lip and enc to flange lip with feeding Positioning: Cross cradle(right breast) Latch: 2 - Grasps breast easily, tongue down, lips flanged, rhythmical sucking. Audible swallowing: 1 - A few with stimulation Type of nipple: 2 - Everted at rest and after stimulation Comfort: 2 - Soft/non-tender Hold: 2 - No assistance needed to correctly position infant at breast LATCH score: 9 Latch assessment: Deep Lips flanged: Yes Suck assessment: Displays both Tools: Nipple shield 24 mm(Ns with initial latch, not with the second latch) Pre-feed weight: 3700 grams/3766 grams Post feed weight: 3704 grams/3768 grams Amount transferred: 6 ml Amount supplemented: 0  Additional Feeding Assessment: Infant oral assessment: Variance Infant oral assessment comment: see above Positioning: Cross cradle(left breast) Latch: 2 - Grasps breast easily, tongue down, lips flanged, rhythmical sucking. Audible swallowing: 2 - Spontaneous  and intermittent Type of nipple: 2 - Everted at rest and after stimulation Comfort: 1 - Filling, red/small blisters or bruises, mild/mod discomfort Hold: 2 - No assistance needed to correctly position infant at breast LATCH score: 9 Latch assessment: Deep Lips flanged: Yes Suck assessment: Displays both   Pre-feed weight: 3704 grams/3768 grams Post feed weight: 3766 grams/3772 grams Amount transferred: 66 ml Amount supplemented: 0  Totals: Total amount transferred: 72 ml Total supplement given: 0 Total amount pumped post feed: 0  Plan: 1. Offer infant the breast with feeding cues 2. Use the # 24 Nipple shield with feedings as needed 3. Try each day without the nipple shield either at the beginning of the feeding or half way through the feeding to see if she can nurse without it with the goal of weaning off of it as soon as possible 4. Keep infant awake at the breast as needed 5. Massage/compress breast with feeding 6. Samantha Norman needs about 69-93 ml (2.5-3 ounces) for 8 feedings a day or 555-740 ml (19-25 ounces) a day 7. Continue pumping every 2-3 hours with double electric breast pump to  protect and promote supply, massage breast with pumping, pump after breast feeding and in place of breast feeding when infant will not latch 8. Warm wet compress to right breast for 10-20 minutes prior to pumping 9. Try pumping right breast while infant is on the left breast if able 10. Offer infant a bottle of 1/2-1 ounce if she is frantic at the breast and will not latch 11. Offer infant bottle post breast feeding if she is still cueing to feed or relatch to the breast with cues 12. Feed infant using the PACE bottle feeding method ([email or web address]) 13. Keep up with good work 14. Call for assistance as needed [phone] 15. Thank you for allowing me to assist you today 16. Follow up with Lactation in 1 week  Ed BlalockSharon S Valaria Kohut RN,  IBCLC                                                      08/31/2017, 10:08 AM

## 2017-09-07 ENCOUNTER — Ambulatory Visit: Payer: Self-pay

## 2017-09-07 NOTE — Lactation Note (Signed)
This note was copied from a baby's chart. 09/07/2017  Name: Samantha Norman MRN: [id number] Date of Birth: 08/17/2016 Gestational Age: Gestational Age: 4260w1d Birth Weight: 128 oz Weight today:   8 pounds 11.4 ounces (3952 grams) with clean size 1 diaper.    Samantha Norman has gained 252 grams in the last 7 days with an average daily weight gain of 36 grams a day.   Samantha Norman presents today for follow up feeding assessment.   Mom reports infant is feeding on the left breast and then needs a bottle post feeding. Mom is feeding infant using the paced bottle feeding method with Dr. Theora GianottiBrown's bottle. Mom reports infant in a growth spurt and was up all night.   Mom is pumping every 2-3 hours and getting a very small amount from the right breast, supply never returned. Mom is getting 1.5-2 ounces from the left breast with pumping. Mom is using hands on pumping. She is feeling very stressed about pumping and feeding and has weaned down her pumping. Reviewed supply and demand and body's response to weaning pumping/feeding.   Mom is undecided if she will continue to BF. Mom is using the NS with feedings. Mom wanted information on weaning supply so as not to get mastitis again. [facility]out from [email or web address] given on weaning pumping. Enc mom to wean slowly to prevent plugged ducts and mastitis. Enc mom to allow herself to make the decision about continued BF/pumping or switching to formula and that she will know when it is time to make a change. Mom reports she feels guilty about not providing EBM but decided she needed to care for herself also. Discussed with mom that it is normal for mom to grieve or feel quilt when feeding plans don't go as planned. Praised mom for taking care of herself. Mom reporst she has good support of FOB at home.   Mom latched infant to the left breast in the cross cradle hold with the # 24 NS, infant initially with tongue thrusting, enc mom to latch deeper for infant to latch. Infant fed for about 5  minutes and transferred 14 ml. She was then relatched to the left breast and fed for about 10 minutes and transferred and additional 6 ml. Enc mom to keep infant awake at the breast and to massage/compress breast with feeding. Mom decided to finish feeding with a bottle of formula. Infant not acting very hungry and drooled a lot with feeding.   Infant to follow up with Dr. Early OsmondKelleher the week of June 24th. [facility] was out yesterday to see mom. Mom aware of BF Support Groups. Mom to follow up with Lactation as needed.   Mom reports she has no further questions at this time. Mom to call with questions/concerns as needed.    General Information: Mother's reason for visit: milk supply has decreased, overwhelmed with feeding/pumping Consult: Follow-up Lactation consultant: Samantha StainSharon Annikah Lovins RN,IBCLC Breastfeeding experience: not latching as well. latching once-2 x a day, infant does not feed well at the breast now Maternal medical conditions: Thyroid(Throid nodule) Maternal medications: Pre-natal vitamin, Motrin (ibuprofen)  Breastfeeding History: Frequency of breast feeding: 1 x a day Duration of feeding: few minutes  Supplementation: Supplement method: bottle(Dr. Brown's) Brand: Enfamil Formula volume: every 2-4 hours Formula frequency: 6+ times a day   Breast milk volume: 1.5-2 ounces Breast milk frequency: 1-2 x a day Total breast milk volume per day: 3-4 ounces Pump type: Medela pump in style Pump frequency: every 4-6 hours Pump volume: 1.5-2 hours  Infant Output Assessment: Voids per 24 hours: 8+ Urine color: Clear yellow Stools per 24 hours: 5-6  Stool color: Brown(some yellow stools)  Breast Assessment: Breast: Soft Nipple: Erect Pain level: 0 Pain interventions: Bra, Coconut oil  Feeding Assessment: Infant oral assessment: Variance Infant oral assessment comment: infant with thick labial frenulum that inserts at the bottom of the gum ridge with divot noted in the  center of the gum ridge. upper lip is tight wtih flanging. infant with good tongue mobility.  Positioning: Cross cradle(left breast) Latch: 1 - Repeated attempts needed to sustain latch, nipple held in mouth throughout feeding, stimulation needed to elicit sucking reflex. Audible swallowing: 2 - Spontaneous and intermittent Type of nipple: 2 - Everted at rest and after stimulation Comfort: 2 - Soft/non-tender Hold: 1 - Assistance needed to correctly position infant at breast and maintain latch LATCH score: 8 Latch assessment: Shallow Lips flanged: Yes Suck assessment: Displays both Tools: Nipple shield 24 mm Pre-feed weight: 3952 grams  Post Feed weight: 3972 grams Total amount transferred: 20 ml Total amount supplemented: 60 ml                                            Plan:  1. Offer infant the breast with feeding cues as mom and infant desire 2. Use the # 24 Nipple shield with feedings as needed 3. Try each day without the nipple shield either at the beginning of the feeding or half way through the feeding to see if she can nurse without it with the goal of weaning off of it as soon as possible 4. Offer infant a bottle of 1/2-1 ounce if she is frantic at the breast and will not latch 5. Keep infant awake at the breast as needed 6. Massage/compress breast with feeding 7. Offer infant bottle post breast feeding if she is still cueing to feed  8. Feed infant using the PACE bottle feeding method ([email or web address]) 9. Samantha Norman needs about 73-98 ml (2.5-3.3 ounces) for 8 feedings a day or 585-780 ml (20-26 ounces) a day 10. Continue pumping,  if you want,  to maintain your supply 8 x a day with double electric breast pump to protect and promote supply, massage breast with pumping, pump after breast feeding and in place of breast feeding when infant will not latch 11. If planning to wean off pumping, follow the weaning process from [email or web address] handout 12. Keep up with good  work 13. Call for assistance as needed [phone] 14. Thank you for allowing me to assist you today 15. Follow up with Lactation as needed         Samantha Blalock RN, IBCLC                                                       Samantha Norman 09/07/2017, 10:08 AM

## 2017-09-13 MED FILL — METHYLERGONOVINE MALEATE 0.: 0.2 | 3 days supply | Qty: 9 | Fill #0

## 2017-09-23 NOTE — H&P (Signed)
32 year old G 1 P 0 status post NSVD with retained placenta tissue  Past Medical History:  Diagnosis Date  . Dizziness    "goes along w/my migraines"  . Family history of adverse reaction to anesthesia    "Mom is overweight, has sleep apnea; has breathing issues w/anesthesia"  . History of chicken pox   . History of depression   . 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 Lumbar vertebral fracture ([place]) 2004   "sports related; L5; no OR"  . Migraine headache    "monthly even w/my RX" (05/31/2015)  . Mononucleosis dx'd 05/2015  . Pneumonia ~ 2010  . Sicca syndrome ([place]) 10/04/2015   Sicca syndrome with polyarthralgia. Established with Dr Trudie Reed rheum - glandular without systemic manifestations - continue topical treatment, no need for systemic therapy at this time. ANA neg, ESR 2, CRP 0.6, anti CCP 5 (10/2015)   . Spondylolysis of lumbosacral region 2004   chronic R L5  . Thyroid nodule 01/23/2016   Right lower thyroid gland 6 mm nodule by CT (05/2015)   Past Surgical History:  Procedure Laterality Date  . brain MRI  11/2013   WNL, no demyelinating disease  . evoked brainstem auditory response  2014   WNL ([facility])   Patient has no known allergies. Prior to Admission medications   Medication Sig Start Date End Date Taking? Authorizing Provider  Prenatal Multivit-Min-Fe-FA (PRENATAL VITAMINS PO) Take 1 tablet by mouth daily.    Yes [provider]   General alert and oriented Lung CTAB Car RRR Abdomen soft and non tender  IMPRESSION: Retained placental tissue  PLAN: D and C Ultrasound guidance Risks reviewed Consent signed

## 2017-09-27 ENCOUNTER — Encounter (HOSPITAL_COMMUNITY): Payer: Self-pay

## 2017-09-27 ENCOUNTER — Encounter (HOSPITAL_COMMUNITY)
Admission: RE | Disposition: A | Discharge status: Home / Self Care | Payer: Self-pay | Source: Ambulatory Visit | Attending: Obstetrics and Gynecology

## 2017-09-27 ENCOUNTER — Ambulatory Visit (HOSPITAL_COMMUNITY): Payer: 59 | Admitting: Anesthesiology

## 2017-09-27 ENCOUNTER — Ambulatory Visit (HOSPITAL_COMMUNITY)
Admission: RE | Admit: 2017-09-27 | Discharge: 2017-09-27 | Disposition: A | Discharge status: Home / Self Care | Payer: 59 | Source: Ambulatory Visit | Attending: Obstetrics and Gynecology | Admitting: Obstetrics and Gynecology

## 2017-09-27 ENCOUNTER — Other Ambulatory Visit: Payer: Self-pay

## 2017-09-27 ENCOUNTER — Ambulatory Visit (HOSPITAL_COMMUNITY): Payer: 59

## 2017-09-27 DIAGNOSIS — O029 Abnormal product of conception, unspecified: Secondary | ICD-10-CM

## 2017-09-27 DIAGNOSIS — O034 Incomplete spontaneous abortion without complication: Secondary | ICD-10-CM | POA: Diagnosis not present

## 2017-09-27 HISTORY — PX: DILATION AND CURETTAGE OF UTERUS: SHX78

## 2017-09-27 LAB — TYPE AND SCREEN
ABO/RH(D): A POS
Antibody Screen: NEGATIVE

## 2017-09-27 LAB — CBC
HEMATOCRIT: 39 % (ref 36.0–46.0)
Hemoglobin: 12.9 g/dL (ref 12.0–15.0)
MCH: 30.9 pg (ref 26.0–34.0)
MCHC: 33.1 g/dL (ref 30.0–36.0)
MCV: 93.3 fL (ref 78.0–100.0)
PLATELETS: 241 10*3/uL (ref 150–400)
RBC: 4.18 MIL/uL (ref 3.87–5.11)
RDW: 13 % (ref 11.5–15.5)
WBC: 5.5 10*3/uL (ref 4.0–10.5)

## 2017-09-27 LAB — HM PAP SMEAR: HM Pap smear: NORMAL

## 2017-09-27 SURGERY — DILATION AND CURETTAGE
Anesthesia: General | Site: Vagina

## 2017-09-27 MED ORDER — FENTANYL CITRATE (PF) 100 MCG/2ML IJ SOLN
INTRAMUSCULAR | Status: AC
  Filled 2017-09-27: qty 2

## 2017-09-27 MED ORDER — FENTANYL CITRATE (PF) 100 MCG/2ML IJ SOLN
INTRAMUSCULAR | Status: DC | PRN
  Administered 2017-09-27 (×2): 50 ug via INTRAVENOUS

## 2017-09-27 MED ORDER — LIDOCAINE 2% (20 MG/ML) 5 ML SYRINGE
INTRAMUSCULAR | Status: DC | PRN
  Administered 2017-09-27: 60 mg via INTRAVENOUS

## 2017-09-27 MED ORDER — LACTATED RINGERS IV SOLN
INTRAVENOUS | Status: DC
  Administered 2017-09-27: 125 mL/h via INTRAVENOUS

## 2017-09-27 MED ORDER — MIDAZOLAM HCL 2 MG/2ML IJ SOLN
INTRAMUSCULAR | Status: AC
  Filled 2017-09-27: qty 2

## 2017-09-27 MED ORDER — SCOPOLAMINE 1 MG/3DAYS TD PT72
1.0000 | MEDICATED_PATCH | Freq: Once | TRANSDERMAL | Status: DC
  Administered 2017-09-27: 1.5 mg via TRANSDERMAL

## 2017-09-27 MED ORDER — LIDOCAINE HCL (CARDIAC) PF 100 MG/5ML IV SOSY
PREFILLED_SYRINGE | INTRAVENOUS | Status: AC
  Filled 2017-09-27: qty 5

## 2017-09-27 MED ORDER — SCOPOLAMINE 1 MG/3DAYS TD PT72
MEDICATED_PATCH | TRANSDERMAL | Status: AC
  Administered 2017-09-27: 1.5 mg via TRANSDERMAL
  Filled 2017-09-27: qty 1

## 2017-09-27 MED ORDER — DEXAMETHASONE SODIUM PHOSPHATE 10 MG/ML IJ SOLN
INTRAMUSCULAR | Status: DC | PRN
  Administered 2017-09-27: 10 mg via INTRAVENOUS

## 2017-09-27 MED ORDER — PROPOFOL 10 MG/ML IV BOLUS
INTRAVENOUS | Status: AC
  Filled 2017-09-27: qty 20

## 2017-09-27 MED ORDER — PROMETHAZINE HCL 25 MG/ML IJ SOLN: 6.2500 mg | INTRAMUSCULAR | Status: DC | PRN

## 2017-09-27 MED ORDER — FENTANYL CITRATE (PF) 100 MCG/2ML IJ SOLN: 25.0000 ug | INTRAMUSCULAR | Status: DC | PRN

## 2017-09-27 MED ORDER — LIDOCAINE HCL 1 % IJ SOLN
INTRAMUSCULAR | Status: DC | PRN
  Administered 2017-09-27: 10 mL

## 2017-09-27 MED ORDER — MIDAZOLAM HCL 5 MG/5ML IJ SOLN
INTRAMUSCULAR | Status: DC | PRN
  Administered 2017-09-27: 2 mg via INTRAVENOUS

## 2017-09-27 MED ORDER — ONDANSETRON HCL 4 MG/2ML IJ SOLN
INTRAMUSCULAR | Status: AC
  Filled 2017-09-27: qty 2

## 2017-09-27 MED ORDER — KETOROLAC TROMETHAMINE 30 MG/ML IJ SOLN
INTRAMUSCULAR | Status: DC | PRN
  Administered 2017-09-27: 30 mg via INTRAVENOUS

## 2017-09-27 MED ORDER — SODIUM CHLORIDE 0.9 % IV SOLN
INTRAVENOUS | Status: AC
  Filled 2017-09-27: qty 2

## 2017-09-27 MED ORDER — MEPERIDINE HCL 25 MG/ML IJ SOLN: 6.2500 mg | INTRAMUSCULAR | Status: DC | PRN

## 2017-09-27 MED ORDER — LACTATED RINGERS IV SOLN: INTRAVENOUS | Status: DC

## 2017-09-27 MED ORDER — DEXAMETHASONE SODIUM PHOSPHATE 10 MG/ML IJ SOLN
INTRAMUSCULAR | Status: AC
  Filled 2017-09-27: qty 1

## 2017-09-27 MED ORDER — KETOROLAC TROMETHAMINE 30 MG/ML IJ SOLN
INTRAMUSCULAR | Status: AC
  Filled 2017-09-27: qty 1

## 2017-09-27 MED ORDER — PROPOFOL 10 MG/ML IV BOLUS
INTRAVENOUS | Status: DC | PRN
  Administered 2017-09-27: 200 mg via INTRAVENOUS

## 2017-09-27 MED ORDER — ONDANSETRON HCL 4 MG/2ML IJ SOLN
INTRAMUSCULAR | Status: DC | PRN
  Administered 2017-09-27: 4 mg via INTRAVENOUS

## 2017-09-27 MED ORDER — LIDOCAINE HCL 1 % IJ SOLN
INTRAMUSCULAR | Status: AC
  Filled 2017-09-27: qty 20

## 2017-09-27 MED ORDER — SODIUM CHLORIDE 0.9 % IV SOLN
2.0000 g | INTRAVENOUS | Status: AC
  Administered 2017-09-27: 2 g via INTRAVENOUS

## 2017-09-27 MED ORDER — METHYLERGONOVINE MALEATE 0.2 MG/ML IJ SOLN
INTRAMUSCULAR | Status: AC
  Administered 2017-09-27: 0.2 mg via INTRAMUSCULAR
  Filled 2017-09-27: qty 1

## 2017-09-27 MED ORDER — MIDAZOLAM HCL 2 MG/2ML IJ SOLN: 0.5000 mg | Freq: Once | INTRAMUSCULAR | Status: DC | PRN

## 2017-09-27 MED ORDER — METHYLERGONOVINE MALEATE 0.2 MG/ML IJ SOLN
0.2000 mg | Freq: Once | INTRAMUSCULAR | Status: AC
  Administered 2017-09-27: 0.2 mg via INTRAMUSCULAR

## 2017-09-27 SURGICAL SUPPLY — 13 items
CATH ROBINSON RED A/P 16FR (CATHETERS) ×3 IMPLANT
CNTNR SPEC C3OZ STD GRAD LEK (MISCELLANEOUS) ×1 IMPLANT
CONT SPEC 3OZ W/LID STRL (MISCELLANEOUS) ×2
GLOVE BIO SURGEON STRL SZ 6.5 (GLOVE) ×4 IMPLANT
GLOVE BIO SURGEONS STRL SZ 6.5 (GLOVE) ×2
GLOVE BIOGEL PI IND STRL 7.0 (GLOVE) ×1 IMPLANT
GLOVE BIOGEL PI INDICATOR 7.0 (GLOVE) ×2
GOWN STRL REUS W/TWL LRG LVL3 (GOWN DISPOSABLE) ×6 IMPLANT
NS IRRIG 1000ML POUR BTL (IV SOLUTION) ×3 IMPLANT
PACK VAGINAL MINOR WOMEN LF (CUSTOM PROCEDURE TRAY) ×3 IMPLANT
PAD OB MATERNITY 4.3X12.25 (PERSONAL CARE ITEMS) ×3 IMPLANT
PAD PREP 24X48 CUFFED NSTRL (MISCELLANEOUS) ×3 IMPLANT
TOWEL OR 17X24 6PK STRL BLUE (TOWEL DISPOSABLE) ×6 IMPLANT

## 2017-09-27 NOTE — Anesthesia Procedure Notes (Signed)
Procedure Name: LMA Insertion Date/Time: 09/27/2017 7:36 AM Performed by: Jhonnie GarnerMarshall, Ahmet Schank M, CRNA Pre-anesthesia Checklist: Patient identified, Emergency Drugs available, Patient being monitored and Suction available Patient Re-evaluated:Patient Re-evaluated prior to induction Oxygen Delivery Method: Circle system utilized Preoxygenation: Pre-oxygenation with 100% oxygen Induction Type: IV induction Ventilation: Mask ventilation without difficulty LMA: LMA inserted LMA Size: 4.0 Number of attempts: 1 Placement Confirmation: positive ETCO2 and breath sounds checked- equal and bilateral Tube secured with: Tape Dental Injury: Teeth and Oropharynx as per pre-operative assessment

## 2017-09-27 NOTE — Transfer of Care (Signed)
Immediate Anesthesia Transfer of Care Note  Patient: Samantha Norman  Procedure(s) Performed: DILATATION AND CURETTAGE with ultrasound guidance (N/A Vagina )  Patient Location: PACU  Anesthesia Type:General  Level of Consciousness: sedated  Airway & Oxygen Therapy: Patient Spontanous Breathing and Patient connected to nasal cannula oxygen  Post-op Assessment: Report given to RN and Post -op Vital signs reviewed and stable  Post vital signs: Reviewed and stable  Last Vitals:  Vitals Value Taken Time  BP 119/80 09/27/2017  8:04 AM  Temp    Pulse    Resp 16 09/27/2017  8:05 AM  SpO2    Vitals shown include unvalidated device data.  Last Pain:  Vitals:   09/27/17 0619  TempSrc: Oral  PainSc: 0-No pain      Patients Stated Pain Goal: 4 (09/27/17 [id number])  Complications: No apparent anesthesia complications

## 2017-09-27 NOTE — Anesthesia Postprocedure Evaluation (Signed)
Anesthesia Post Note  Patient: Samantha Norman  Procedure(s) Performed: DILATATION AND CURETTAGE with ultrasound guidance (N/A Vagina )     Patient location during evaluation: PACU Anesthesia Type: General Level of consciousness: awake and alert, oriented and patient cooperative Pain management: pain level controlled Vital Signs Assessment: post-procedure vital signs reviewed and stable Respiratory status: spontaneous breathing, nonlabored ventilation and respiratory function stable Cardiovascular status: blood pressure returned to baseline and stable Postop Assessment: no apparent nausea or vomiting Anesthetic complications: no    Last Vitals:  Vitals:   09/27/17 0930 09/27/17 1000  BP: 113/81 116/75  Pulse: (!) 52 (!) 58  Resp: 18 18  Temp: 36.4 C (!) 36.4 C  SpO2: 97% 99%    Last Pain:  Vitals:   09/27/17 1000  TempSrc:   PainSc: 3    Pain Goal: Patients Stated Pain Goal: 4 (09/27/17 1000)               Renold Kozar,E. Siaosi Alter

## 2017-09-27 NOTE — Discharge Instructions (Signed)

## 2017-09-27 NOTE — Progress Notes (Signed)
H and p on the chart No changes BP (!) 111/93   Pulse 87   Temp 97.9 F (36.6 C) (Oral)   Resp 16   Ht 5\' 8"  (1.727 m)   Wt 72.6 kg (160 lb)   LMP 11/09/2016   SpO2 100%   BMI 24.33 kg/m  Results for orders placed or performed during the hospital encounter of 09/27/17 (from the past 24 hour(s))  Type and screen [facility]     Status: None   Collection Time: 09/27/17  6:05 AM  Result Value Ref Range   ABO/RH(D) A POS    Antibody Screen NEG    Sample Expiration      09/30/2017 Performed at [facility], [address] [place], [place] [address]   CBC     Status: None   Collection Time: 09/27/17  6:05 AM  Result Value Ref Range   WBC 5.5 4.0 - 10.5 K/uL   RBC 4.18 3.87 - 5.11 MIL/uL   Hemoglobin 12.9 12.0 - 15.0 g/dL   HCT 91.439.0 78.236.0 - 95.646.0 %   MCV 93.3 78.0 - 100.0 fL   MCH 30.9 26.0 - 34.0 pg   MCHC 33.1 30.0 - 36.0 g/dL   RDW 21.313.0 08.611.5 - 57.815.5 %   Platelets 241 150 - 400 K/uL   Proceed with D and C with ultrasound guidance Risks reviewed

## 2017-09-27 NOTE — Anesthesia Preprocedure Evaluation (Addendum)
Anesthesia Evaluation  Patient identified by MRN, date of birth, ID band Patient awake    Reviewed: Allergy & Precautions, NPO status , Patient's Chart, lab work & pertinent test results  History of Anesthesia Complications Negative for: history of anesthetic complications  Airway Mallampati: II  TM Distance: >3 FB Neck ROM: Full    Dental  (+) Teeth Intact, Dental Advisory Given   Pulmonary neg pulmonary ROS,    breath sounds clear to auscultation       Cardiovascular negative cardio ROS   Rhythm:Regular Rate:Normal     Neuro/Psych  Headaches, Chronic back pain    GI/Hepatic negative GI ROS, Neg liver ROS,   Endo/Other  negative endocrine ROS  Renal/GU negative Renal ROS     Musculoskeletal polyarthralgia   Abdominal   Peds  Hematology plt 241k   Anesthesia Other Findings   Reproductive/Obstetrics                            Anesthesia Physical Anesthesia Plan  ASA: II  Anesthesia Plan: General   Post-op Pain Management:    Induction: Intravenous  PONV Risk Score and Plan: 3 and Ondansetron, Dexamethasone and Scopolamine patch - Pre-op  Airway Management Planned: LMA  Additional Equipment:   Intra-op Plan:   Post-operative Plan:   Informed Consent: I have reviewed the patients History and Physical, chart, labs and discussed the procedure including the risks, benefits and alternatives for the proposed anesthesia with the patient or authorized representative who has indicated his/her understanding and acceptance.   Dental advisory given  Plan Discussed with: CRNA and Surgeon  Anesthesia Plan Comments: (Plan routine monitors, GA- LMA OK)        Anesthesia Quick Evaluation

## 2017-09-27 NOTE — Brief Op Note (Signed)
09/27/2017  8:04 AM  PATIENT:  Samantha Norman  32 y.o. female  PRE-OPERATIVE DIAGNOSIS:   Retained Placenta  POST-OPERATIVE DIAGNOSIS:   Same  PROCEDURE:  Procedure(s): DILATATION AND CURETTAGE with ultrasound guidance (N/A)  SURGEON:  Surgeon(s) and Role:    * Dian Queen, MD - Primary  PHYSICIAN ASSISTANT:   ASSISTANTS: none   ANESTHESIA:   paracervical block and MAC  EBL:  100 mL   BLOOD ADMINISTERED:none  DRAINS: none   LOCAL MEDICATIONS USED:  LIDOCAINE   SPECIMEN:  Source of Specimen:  placental fragments  DISPOSITION OF SPECIMEN:  PATHOLOGY  COUNTS:  YES  TOURNIQUET:  * No tourniquets in log *  DICTATION: .Other Dictation: Dictation Number dictated  PLAN OF CARE: Discharge to home after PACU  PATIENT DISPOSITION:  PACU - hemodynamically stable.   Delay start of Pharmacological VTE agent (>24hrs) due to surgical blood loss or risk of bleeding: not applicable

## 2017-09-28 ENCOUNTER — Encounter (HOSPITAL_COMMUNITY): Payer: Self-pay | Admitting: Obstetrics and Gynecology

## 2017-09-30 NOTE — Op Note (Signed)
NAME: Samantha Norman, Samantha Norman MEDICAL RECORD [id number] ACCOUNT [id number] DATE OF BIRTH:02-Jul-1985 FACILITY: [facility] LOCATION: [facility] PH[facility]ICIAN:Roger Kettles Rosita FireL. Shantice Menger, MD  OPERATIVE REPORT  DATE OF PROCEDURE:  09/27/2017  PREOPERATIVE DIAGNOSIS:  Retained placental tissue.  POSTOPERATIVE DIAGNOSIS:  Retained placental tissue.  PROCEDURE:  Dilation and Curettage with ultrasound guidance  SURGEON:   Marcelle OverlieMichelle Field Staniszewski, MD  ASSISTANT:  none  ESTIMATED BLOOD LOSS:  100 ml.  Complications:     _____  PATHOLOGY:  Probable placental fragments sent to Pathology.  DESCRIPTION OF PROCEDURE:  Patient was taken to the operating room after consent was obtained.  She was prepped and draped in the usual sterile fashion.  A speculum was inserted into the vagina.  The cervix was grasped with a tenaculum with ultrasound  guidance.  We could see the small, probable retained tissue, nothing measuring more than 10-15 mm in diameter.  A paracervical block was performed and a sharp curet was inserted.  The uterus was thoroughly curetted several times.  I could see it appeared  to be possibly placental fragments indicating _____.  This was sent to pathology.  The uterine cavity appeared clean at the end of the procedure.  Minimal bleeding was noted.  She was observed for a period of time.  All sponge and needle counts were  _____.  She was sent to the recovery room in stable condition.     AN/NUANCE  D:09/29/2017 T:09/29/2017 JOB:[id number]

## 2017-10-01 DIAGNOSIS — Z1389 Encounter for screening for other disorder: Secondary | ICD-10-CM | POA: Diagnosis not present

## 2017-10-05 MED FILL — NORETHIN-ESTRAD-FERR 1-0.02: 1-20 | 84 days supply | Qty: 84 | Fill #0

## 2017-11-18 ENCOUNTER — Other Ambulatory Visit: Payer: Self-pay | Admitting: Family Medicine

## 2017-11-18 DIAGNOSIS — Z13220 Encounter for screening for lipoid disorders: Secondary | ICD-10-CM

## 2017-11-18 DIAGNOSIS — M3500 Sicca syndrome, unspecified: Secondary | ICD-10-CM

## 2017-11-18 DIAGNOSIS — E041 Nontoxic single thyroid nodule: Secondary | ICD-10-CM

## 2017-11-19 ENCOUNTER — Other Ambulatory Visit (INDEPENDENT_AMBULATORY_CARE_PROVIDER_SITE_OTHER): Payer: 59

## 2017-11-19 DIAGNOSIS — Z13220 Encounter for screening for lipoid disorders: Secondary | ICD-10-CM

## 2017-11-19 DIAGNOSIS — M3500 Sicca syndrome, unspecified: Secondary | ICD-10-CM | POA: Diagnosis not present

## 2017-11-19 DIAGNOSIS — E041 Nontoxic single thyroid nodule: Secondary | ICD-10-CM

## 2017-11-19 LAB — COMPREHENSIVE METABOLIC PANEL
ALBUMIN: 4.3 g/dL (ref 3.5–5.2)
ALK PHOS: 30 U/L — AB (ref 39–117)
ALT: 21 U/L (ref 0–35)
AST: 17 U/L (ref 0–37)
BUN: 17 mg/dL (ref 6–23)
CHLORIDE: 106 meq/L (ref 96–112)
CO2: 24 mEq/L (ref 19–32)
Calcium: 9.6 mg/dL (ref 8.4–10.5)
Creatinine, Ser: 1.09 mg/dL (ref 0.40–1.20)
GFR: 62.24 mL/min (ref >60.00)
Glucose, Bld: 86 mg/dL (ref 70–99)
POTASSIUM: 4.4 meq/L (ref 3.5–5.1)
SODIUM: 138 meq/L (ref 135–145)
TOTAL PROTEIN: 7 g/dL (ref 6.0–8.3)
Total Bilirubin: 1.2 mg/dL (ref 0.2–1.2)

## 2017-11-19 LAB — SEDIMENTATION RATE: Sed Rate: 9 mm/hr (ref 0–20)

## 2017-11-19 LAB — LIPID PANEL
CHOLESTEROL: 190 mg/dL (ref 0–200)
HDL: 42.1 mg/dL (ref >39.00)
LDL CALC: 127 mg/dL — AB (ref 0–99)
NONHDL: 148.06
Total CHOL/HDL Ratio: 5
Triglycerides: 106 mg/dL (ref 0.0–149.0)
VLDL: 21.2 mg/dL (ref 0.0–40.0)

## 2017-11-19 LAB — TSH: TSH: 1.5 u[IU]/mL (ref 0.35–4.50)

## 2017-11-23 ENCOUNTER — Ambulatory Visit (INDEPENDENT_AMBULATORY_CARE_PROVIDER_SITE_OTHER): Payer: 59 | Admitting: Family Medicine

## 2017-11-23 ENCOUNTER — Encounter: Payer: Self-pay | Admitting: Family Medicine

## 2017-11-23 VITALS — BP 118/80 | HR 64 | Temp 98.0°F | Ht 67.5 in | Wt 153.0 lb

## 2017-11-23 DIAGNOSIS — Z0000 Encounter for general adult medical examination without abnormal findings: Secondary | ICD-10-CM | POA: Insufficient documentation

## 2017-11-23 DIAGNOSIS — M3500 Sicca syndrome, unspecified: Secondary | ICD-10-CM

## 2017-11-23 DIAGNOSIS — E041 Nontoxic single thyroid nodule: Secondary | ICD-10-CM | POA: Diagnosis not present

## 2017-11-23 NOTE — Assessment & Plan Note (Addendum)
Not appreciated TSH normal

## 2017-11-23 NOTE — Patient Instructions (Addendum)
You are doing well today Return as needed or in 1 year for next physical.  Health Maintenance, Female Adopting a healthy lifestyle and getting preventive care can go a long way to promote health and wellness. Talk with your health care provider about what schedule of regular examinations is right for you. This is a good chance for you to check in with your provider about disease prevention and staying healthy. In between checkups, there are plenty of things you can do on your own. Experts have done a lot of research about which lifestyle changes and preventive measures are most likely to keep you healthy. Ask your health care provider for more information. Weight and diet Eat a healthy diet  Be sure to include plenty of vegetables, fruits, low-fat dairy products, and lean protein.  Do not eat a lot of foods high in solid fats, added sugars, or salt.  Get regular exercise. This is one of the most important things you can do for your health. ? Most adults should exercise for at least 150 minutes each week. The exercise should increase your heart rate and make you sweat (moderate-intensity exercise). ? Most adults should also do strengthening exercises at least twice a week. This is in addition to the moderate-intensity exercise.  Maintain a healthy weight  Body mass index (BMI) is a measurement that can be used to identify possible weight problems. It estimates body fat based on height and weight. Your health care provider can help determine your BMI and help you achieve or maintain a healthy weight.  For females 89 years of age and older: ? A BMI below 18.5 is considered underweight. ? A BMI of 18.5 to 24.9 is normal. ? A BMI of 25 to 29.9 is considered overweight. ? A BMI of 30 and above is considered obese.  Watch levels of cholesterol and blood lipids  You should start having your blood tested for lipids and cholesterol at 31 years of age, then have this test every 5 years.  You may  need to have your cholesterol levels checked more often if: ? Your lipid or cholesterol levels are high. ? You are older than 31 years of age. ? You are at high risk for heart disease.  Cancer screening Lung Cancer  Lung cancer screening is recommended for adults 81-35 years old who are at high risk for lung cancer because of a history of smoking.  A yearly low-dose CT scan of the lungs is recommended for people who: ? Currently smoke. ? Have quit within the past 15 years. ? Have at least a 30-pack-year history of smoking. A pack year is smoking an average of one pack of cigarettes a day for 1 year.  Yearly screening should continue until it has been 15 years since you quit.  Yearly screening should stop if you develop a health problem that would prevent you from having lung cancer treatment.  Breast Cancer  Practice breast self-awareness. This means understanding how your breasts normally appear and feel.  It also means doing regular breast self-exams. Let your health care provider know about any changes, no matter how small.  If you are in your 20s or 30s, you should have a clinical breast exam (CBE) by a health care provider every 1-3 years as part of a regular health exam.  If you are 40 or older, have a CBE every year. Also consider having a breast X-ray (mammogram) every year.  If you have a family history of breast cancer,  talk to your health care provider about genetic screening.  If you are at high risk for breast cancer, talk to your health care provider about having an MRI and a mammogram every year.  Breast cancer gene (BRCA) assessment is recommended for women who have family members with BRCA-related cancers. BRCA-related cancers include: ? Breast. ? Ovarian. ? Tubal. ? Peritoneal cancers.  Results of the assessment will determine the need for genetic counseling and BRCA1 and BRCA2 testing.  Cervical Cancer Your health care provider may recommend that you be  screened regularly for cancer of the pelvic organs (ovaries, uterus, and vagina). This screening involves a pelvic examination, including checking for microscopic changes to the surface of your cervix (Pap test). You may be encouraged to have this screening done every 3 years, beginning at age 21.  For women ages 29-65, health care providers may recommend pelvic exams and Pap testing every 3 years, or they may recommend the Pap and pelvic exam, combined with testing for human papilloma virus (HPV), every 5 years. Some types of HPV increase your risk of cervical cancer. Testing for HPV may also be done on women of any age with unclear Pap test results.  Other health care providers may not recommend any screening for nonpregnant women who are considered low risk for pelvic cancer and who do not have symptoms. Ask your health care provider if a screening pelvic exam is right for you.  If you have had past treatment for cervical cancer or a condition that could lead to cancer, you need Pap tests and screening for cancer for at least 20 years after your treatment. If Pap tests have been discontinued, your risk factors (such as having a new sexual partner) need to be reassessed to determine if screening should resume. Some women have medical problems that increase the chance of getting cervical cancer. In these cases, your health care provider may recommend more frequent screening and Pap tests.  Colorectal Cancer  This type of cancer can be detected and often prevented.  Routine colorectal cancer screening usually begins at 31 years of age and continues through 31 years of age.  Your health care provider may recommend screening at an earlier age if you have risk factors for colon cancer.  Your health care provider may also recommend using home test kits to check for hidden blood in the stool.  A small camera at the end of a tube can be used to examine your colon directly (sigmoidoscopy or colonoscopy).  This is done to check for the earliest forms of colorectal cancer.  Routine screening usually begins at age 92.  Direct examination of the colon should be repeated every 5-10 years through 31 years of age. However, you may need to be screened more often if early forms of precancerous polyps or small growths are found.  Skin Cancer  Check your skin from head to toe regularly.  Tell your health care provider about any new moles or changes in moles, especially if there is a change in a mole's shape or color.  Also tell your health care provider if you have a mole that is larger than the size of a pencil eraser.  Always use sunscreen. Apply sunscreen liberally and repeatedly throughout the day.  Protect yourself by wearing long sleeves, pants, a wide-brimmed hat, and sunglasses whenever you are outside.  Heart disease, diabetes, and high blood pressure  High blood pressure causes heart disease and increases the risk of stroke. High blood pressure is  more likely to develop in: ? People who have blood pressure in the high end of the normal range (130-139/85-89 mm Hg). ? People who are overweight or obese. ? People who are African American.  If you are 47-80 years of age, have your blood pressure checked every 3-5 years. If you are 94 years of age or older, have your blood pressure checked every year. You should have your blood pressure measured twice-once when you are at a hospital or clinic, and once when you are not at a hospital or clinic. Record the average of the two measurements. To check your blood pressure when you are not at a hospital or clinic, you can use: ? An automated blood pressure machine at a pharmacy. ? A home blood pressure monitor.  If you are between 34 years and 10 years old, ask your health care provider if you should take aspirin to prevent strokes.  Have regular diabetes screenings. This involves taking a blood sample to check your fasting blood sugar level. ? If  you are at a normal weight and have a low risk for diabetes, have this test once every three years after 31 years of age. ? If you are overweight and have a high risk for diabetes, consider being tested at a younger age or more often. Preventing infection Hepatitis B  If you have a higher risk for hepatitis B, you should be screened for this virus. You are considered at high risk for hepatitis B if: ? You were born in a country where hepatitis B is common. Ask your health care provider which countries are considered high risk. ? Your parents were born in a high-risk country, and you have not been immunized against hepatitis B (hepatitis B vaccine). ? You have HIV or AIDS. ? You use needles to inject street drugs. ? You live with someone who has hepatitis B. ? You have had sex with someone who has hepatitis B. ? You get hemodialysis treatment. ? You take certain medicines for conditions, including cancer, organ transplantation, and autoimmune conditions.  Hepatitis C  Blood testing is recommended for: ? Everyone born from [birth year] through 1964. ? Anyone with known risk factors for hepatitis C.  Sexually transmitted infections (STIs)  You should be screened for sexually transmitted infections (STIs) including gonorrhea and chlamydia if: ? You are sexually active and are younger than 31 years of age. ? You are older than 31 years of age and your health care provider tells you that you are at risk for this type of infection. ? Your sexual activity has changed since you were last screened and you are at an increased risk for chlamydia or gonorrhea. Ask your health care provider if you are at risk.  If you do not have HIV, but are at risk, it may be recommended that you take a prescription medicine daily to prevent HIV infection. This is called pre-exposure prophylaxis (PrEP). You are considered at risk if: ? You are sexually active and do not regularly use condoms or know the HIV status of your  partner(s). ? You take drugs by injection. ? You are sexually active with a partner who has HIV.  Talk with your health care provider about whether you are at high risk of being infected with HIV. If you choose to begin PrEP, you should first be tested for HIV. You should then be tested every 3 months for as long as you are taking PrEP. Pregnancy  If you are premenopausal and you  may become pregnant, ask your health care provider about preconception counseling.  If you may become pregnant, take 400 to 800 micrograms (mcg) of folic acid every day.  If you want to prevent pregnancy, talk to your health care provider about birth control (contraception). Osteoporosis and menopause  Osteoporosis is a disease in which the bones lose minerals and strength with aging. This can result in serious bone fractures. Your risk for osteoporosis can be identified using a bone density scan.  If you are 59 years of age or older, or if you are at risk for osteoporosis and fractures, ask your health care provider if you should be screened.  Ask your health care provider whether you should take a calcium or vitamin D supplement to lower your risk for osteoporosis.  Menopause may have certain physical symptoms and risks.  Hormone replacement therapy may reduce some of these symptoms and risks. Talk to your health care provider about whether hormone replacement therapy is right for you. Follow these instructions at home:  Schedule regular health, dental, and eye exams.  Stay current with your immunizations.  Do not use any tobacco products including cigarettes, chewing tobacco, or electronic cigarettes.  If you are pregnant, do not drink alcohol.  If you are breastfeeding, limit how much and how often you drink alcohol.  Limit alcohol intake to no more than 1 drink per day for nonpregnant women. One drink equals 12 ounces of beer, 5 ounces of wine, or 1 ounces of hard liquor.  Do not use street  drugs.  Do not share needles.  Ask your health care provider for help if you need support or information about quitting drugs.  Tell your health care provider if you often feel depressed.  Tell your health care provider if you have ever been abused or do not feel safe at home. This information is not intended to replace advice given to you by your health care provider. Make sure you discuss any questions you have with your health care provider. Document Released: 09/29/2010 Document Revised: 08/22/2015 Document Reviewed: 12/18/2014 Elsevier Interactive Patient Education  [employer].

## 2017-11-23 NOTE — Progress Notes (Signed)
BP 118/80 (BP Location: Left Arm, Patient Position: Sitting, Cuff Size: Normal)   Pulse 64   Temp 98 F (36.7 C) (Oral)   Ht 5' 7.5" (1.715 m)   Wt 153 lb (69.4 kg)   LMP 11/22/2017   SpO2 99%   BMI 23.61 kg/m    CC: CPE Subjective:    Patient ID: Samantha Norman Blood, female    DOB: [birth year], 31 y.o.   MRN: [id number]  HPI: Samantha Norman is a 32 y.o. female presenting on 11/23/2017 for Annual Exam   Last seen here 03/2016. Just had baby, 3 mo old at home.  Formula fed.  Complicated pregnancy - was unable to start exercise until 9-10 wks postpartum.  Mood overall doing well.  Noticing increasing acne since pregnancy - using salicylic acid, benzoyl peroxide. Both pustular and black head acne.   H/o gluten intolerance.   Preventative: Well woman with [facility] Dr Vickey Sages. Pap 09/2017. On OCP, period only once a year Flu shot yearly Tdap 05/2017 Seat belt use discussed Sunscreen use discussed. No changing moles on skin. Non smoker Alcohol - none Dentist Q6 mo Eye exam yearly  Married. 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 Interim medical history since our last visit reviewed. Allergies and medications reviewed and updated. Outpatient Medications Prior to Visit  Medication Sig Dispense Refill  . norethindrone-ethinyl estradiol (JUNEL FE,GILDESS FE,LOESTRIN FE) 1-20 MG-MCG tablet Take 1 tablet by mouth daily.  4  . Prenatal Multivit-Min-Fe-FA (PRENATAL VITAMINS PO) Take 1 tablet by mouth daily.      No facility-administered medications prior to visit.      Per HPI unless specifically indicated in ROS section below Review of Systems  Constitutional: Negative for activity change, appetite change,  chills, fatigue, fever and unexpected weight change.  HENT: Negative for hearing loss.   Eyes: Negative for visual disturbance.  Respiratory: Negative for cough, chest tightness, shortness of breath and wheezing.   Cardiovascular: Negative for chest pain, palpitations and leg swelling.  Gastrointestinal: Negative for abdominal distention, abdominal pain, blood in stool, constipation, diarrhea, nausea and vomiting.  Genitourinary: Negative for difficulty urinating and hematuria.  Musculoskeletal: Negative for arthralgias, myalgias and neck pain.  Skin: Negative for rash.  Neurological: Negative for dizziness, seizures, syncope and headaches.  Hematological: Negative for adenopathy. Does not bruise/bleed easily.  Psychiatric/Behavioral: Negative for dysphoric mood. The patient is not nervous/anxious.        Objective:    BP 118/80 (BP Location: Left Arm, Patient Position: Sitting, Cuff Size: Normal)   Pulse 64   Temp 98 F (36.7 C) (Oral)   Ht 5' 7.5" (1.715 m)   Wt 153 lb (69.4 kg)   LMP 11/22/2017   SpO2 99%   BMI 23.61 kg/m   Wt Readings from Last 3 Encounters:  11/23/17 153 lb (69.4 kg)  09/27/17 160 lb (72.6 kg)  08/17/17 187 lb 14.4 oz (85.2 kg)    Physical Exam  Constitutional: She is oriented to person, place, and time. She appears well-developed and well-nourished. No distress.  HENT:  Head: Normocephalic and atraumatic.  Right Ear: Hearing, tympanic membrane, external ear and ear canal normal.  Left Ear: Hearing, tympanic membrane, external ear and ear canal normal.  Nose:  Nose normal.  Mouth/Throat: Uvula is midline, oropharynx is clear and moist and mucous membranes are normal. No oropharyngeal exudate, posterior oropharyngeal edema or posterior oropharyngeal erythema.  Eyes: Pupils are equal, round, and reactive to light. Conjunctivae and EOM are normal. No scleral icterus.  Neck: Normal range of motion. Neck supple. No thyromegaly present.  Cardiovascular: Normal  rate, regular rhythm, normal heart sounds and intact distal pulses.  No murmur heard. Pulses:      Radial pulses are 2+ on the right side, and 2+ on the left side.  Pulmonary/Chest: Effort normal and breath sounds normal. No respiratory distress. She has no wheezes. She has no rales.  Abdominal: Soft. Bowel sounds are normal. She exhibits no distension and no mass. There is no tenderness. There is no rebound and no guarding.  Musculoskeletal: Normal range of motion. She exhibits no edema.  Lymphadenopathy:    She has no cervical adenopathy.  Neurological: She is alert and oriented to person, place, and time.  CN grossly intact, station and gait intact  Skin: Skin is warm and dry. No rash noted.  Evident dermatographism  Psychiatric: She has a normal mood and affect. Her behavior is normal. Judgment and thought content normal.  Nursing note and vitals reviewed.  Results for orders placed or performed in visit on 11/23/17  HM PAP SMEAR  Result Value Ref Range   HM Pap smear normal       Assessment & Plan:   Problem List Items Addressed This Visit    Thyroid nodule    Not appreciated TSH normal      Sicca syndrome (HCC)    Stable period off treatment. She uses biotene and lubricating eye drops as needed.       Health maintenance examination - Primary    Preventative protocols reviewed and updated unless pt declined. Discussed healthy diet and lifestyle.           No orders of the defined types were placed in this encounter.  Orders Placed This Encounter  Procedures  . HM PAP SMEAR    This external order was created through the Results Console.    Follow up plan: Return in about 1 year (around 11/24/2018) for annual exam, prior fasting for blood work.  Samantha BoydenJavier Jamarrion Budai, MD

## 2017-11-23 NOTE — Assessment & Plan Note (Signed)
Preventative protocols reviewed and updated unless pt declined. Discussed healthy diet and lifestyle.  

## 2017-11-23 NOTE — Assessment & Plan Note (Signed)
Stable period off treatment. She uses biotene and lubricating eye drops as needed.

## 2017-12-24 MED FILL — NORETHIN-ESTRAD-FERR 1-0.02: 1-20 | 84 days supply | Qty: 84 | Fill #1

## 2018-01-30 IMAGING — CT CT NECK W/ CM
4 series · 15 of 33 positions shown, 18 images · IV contrast (Iodine)
Comparison: None.

CLINICAL DATA: 27-year-old female with sore throat. Difficulty
swallowing. Initial encounter.

EXAM:
CT NECK WITH CONTRAST
TECHNIQUE: Multidetector CT imaging of the neck was performed using the
standard protocol following the bolus administration of intravenous
contrast.
CONTRAST:  75mL OMNIPAQUE IOHEXOL 300 MG/ML  SOLN

[Series 201: soft tissue, idose (2) · axial · 0.49mm/px · z∈[+725,+769]mm · 2 of 130 slices shown]
[im 22/130  soft-tissue]
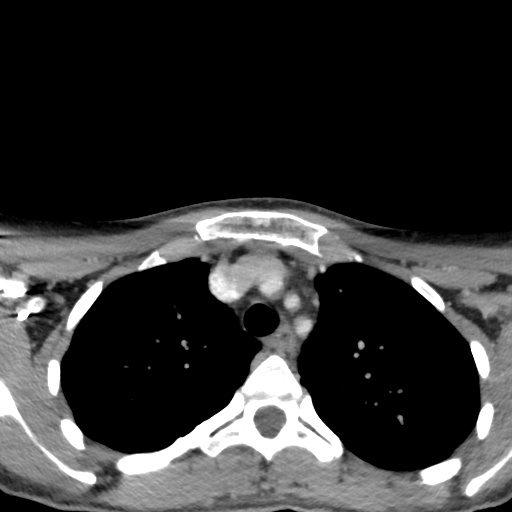
[im 44/130  soft-tissue]
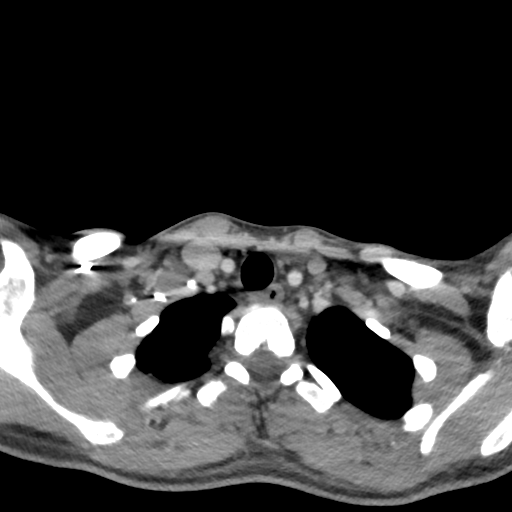

[Series 203: coronal, idose (2) · coronal · 0.45mm/px · 3 of 123 slices shown]
[im 25/123  bone]
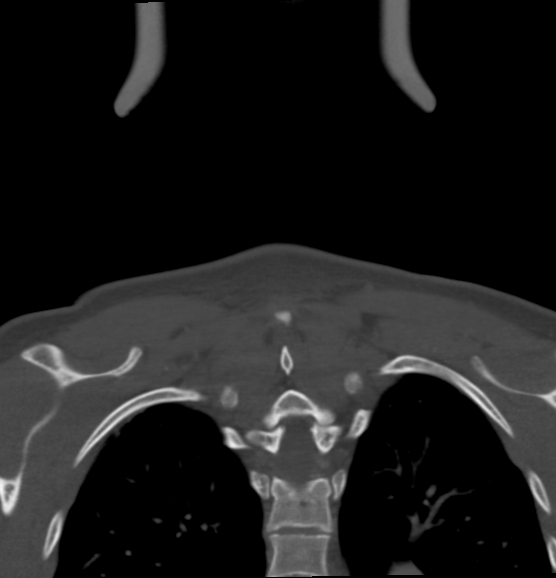
[im 49/123  bone]
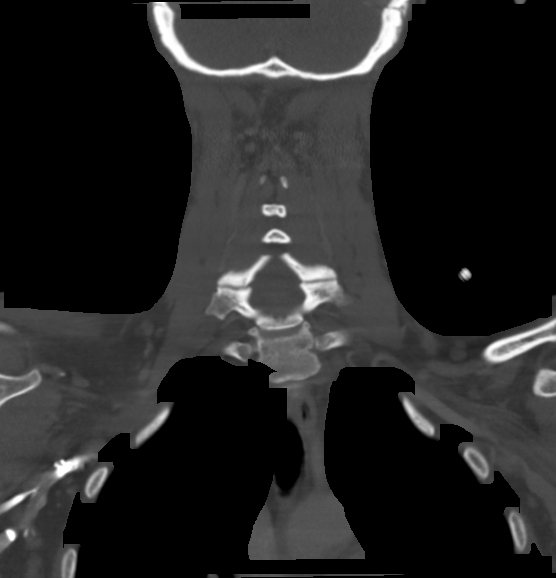
[im 74/123  bone]
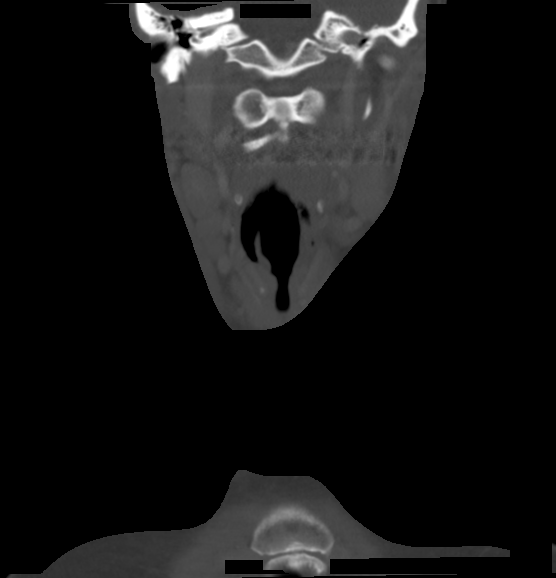

[Series 204: sagittal, idose (2) · sagittal · 0.45mm/px · 5 of 123 slices shown, 6 images]
[im 41/123  bone]
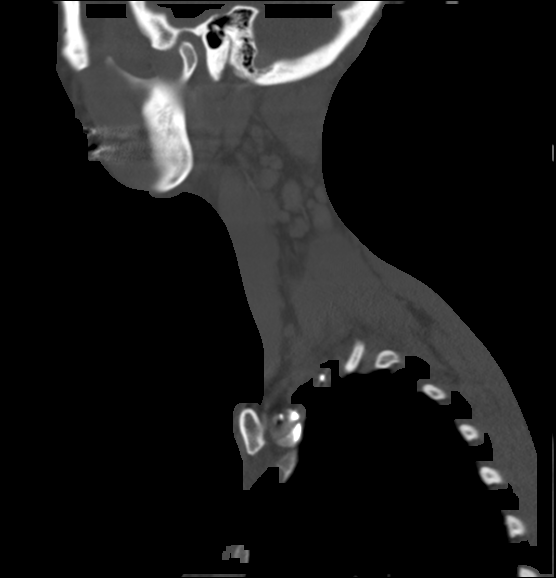
[im 51/123  bone]
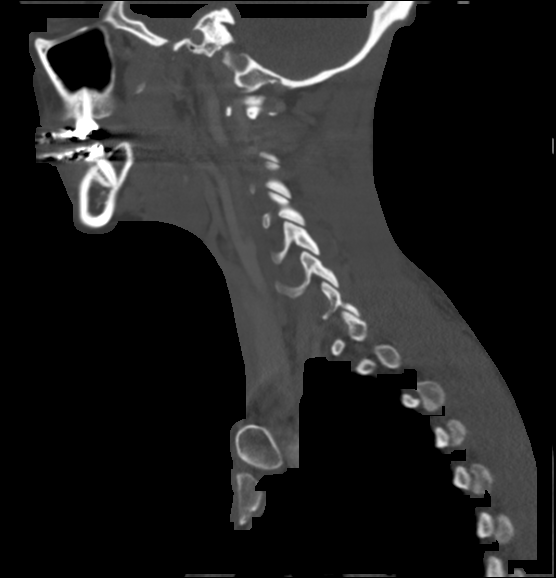
[im 62/123  soft-tissue]
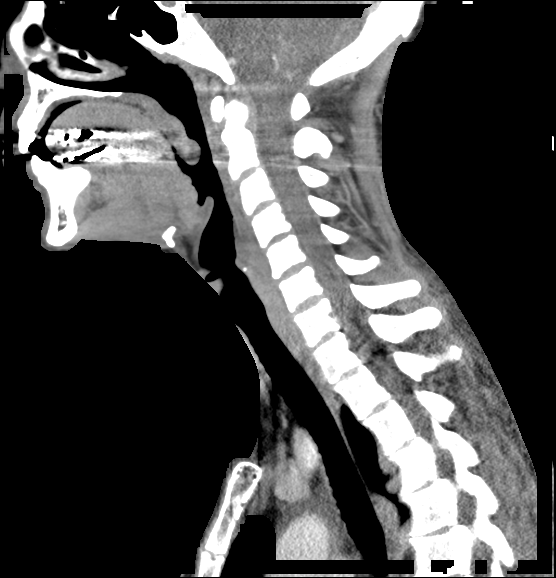
[im 62/123  bone]
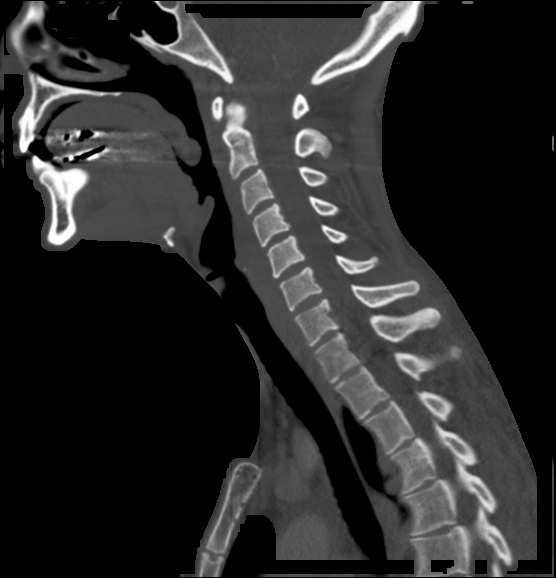
[im 72/123  bone]
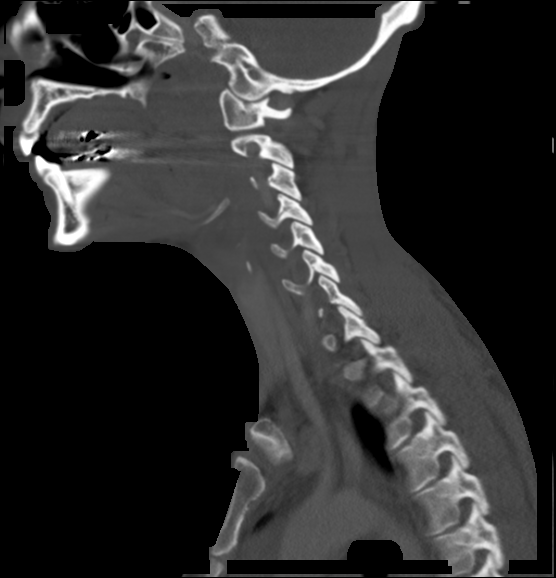
[im 82/123  bone]
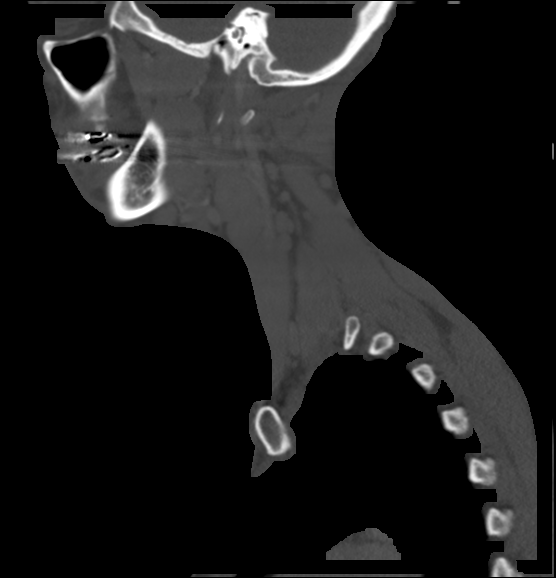

[Series 205: orthogonal, idose (2) · axial · 0.51mm/px · z∈[+729,+897]mm · 5 of 128 slices shown, 7 images]
[im 22/128  soft-tissue]
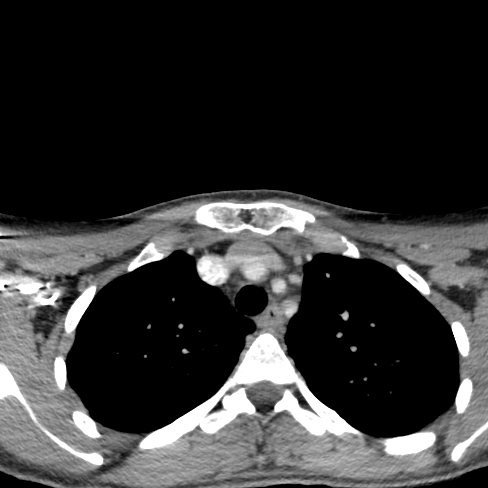
[im 22/128  bone]
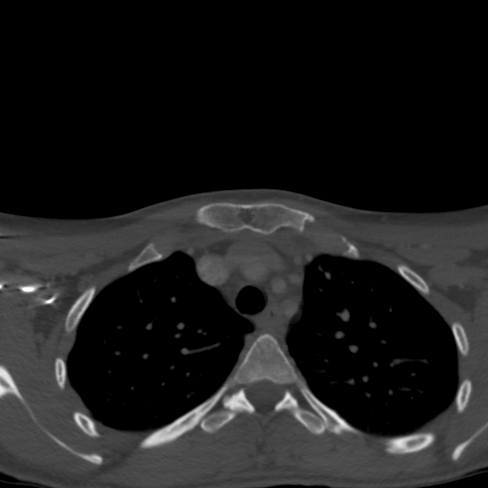
[im 43/128  bone]
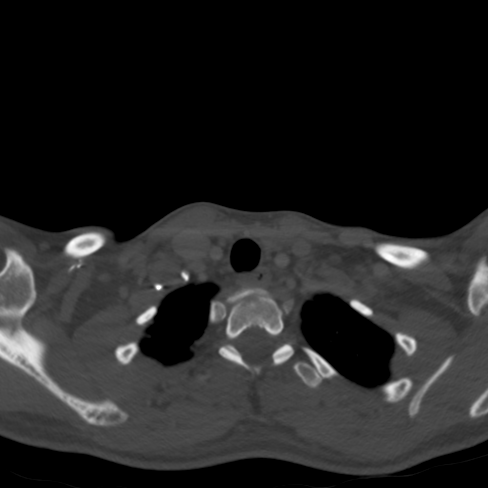
[im 64/128  bone]
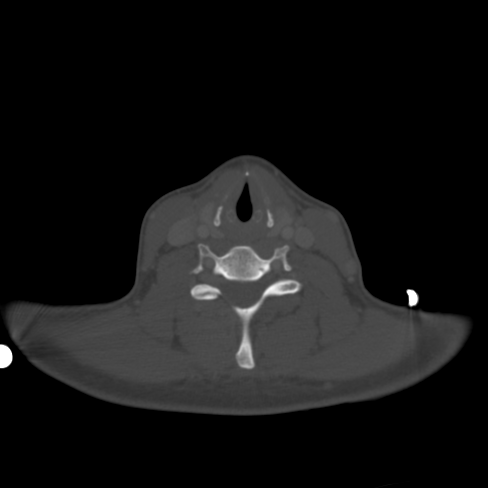
[im 85/128  bone]
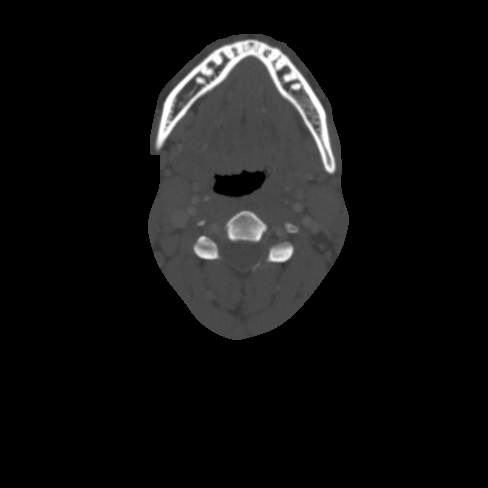
[im 106/128  soft-tissue]
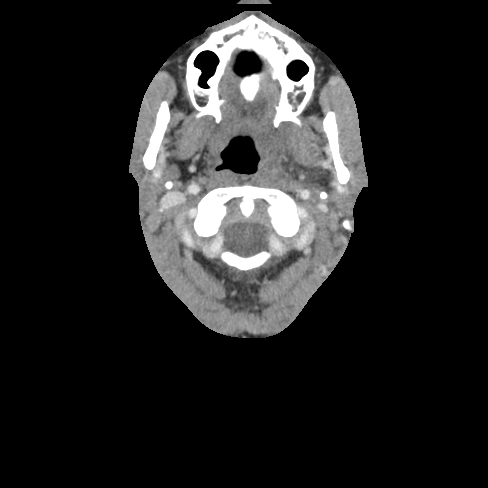
[im 106/128  bone]
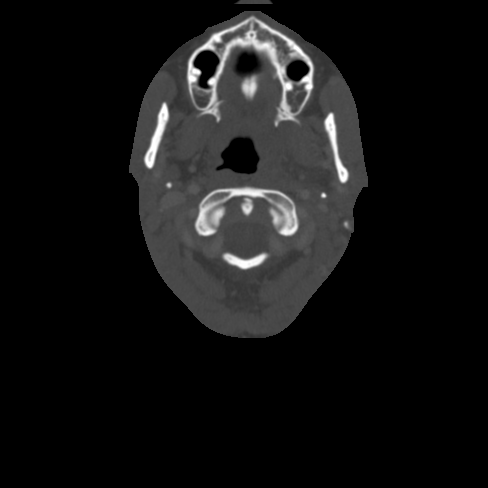

[15 of 33 positions shown; findings below may reference images not displayed]

FINDINGS: Pharynx and larynx: Diffuse inflammation of the left palatine
tonsil. Dental artifact limits evaluation. Within the inflamed left
tonsil subtle low-density structure measuring up to 1.3 x 0.8 cm
possibly represents small contained abscess although difficult to
discern with any certainty. Extension of inflammatory process across
midline to the right. Extension into the left parapharyngeal space
with hazy infiltration of fat planes (bordering the left muscles of
mastication). Small amount of retropharyngeal fluid. No deep
drainable abscess currently noted. Narrowing of the air column
greater on the left. Inflammation extends to the level of the left
piriform sinus which is slightly narrowed.

Salivary glands: No primary salivary gland abnormality.

Thyroid: Right lower thyroid gland 6 mm nodule.

Lymph nodes: Diffuse bilateral adenopathy greatest level 2 region.

Vascular: No evidence of thrombosis of the internal jugular veins.

Limited intracranial: Negative.

Visualized orbits: Negative.

Mastoids and visualized paranasal sinuses: Clear.

Skeleton: No acute abnormality.

Upper chest: Negative.
IMPRESSION: Diffuse left-sided tonsillitis with spread of inflammatory process
into the left parapharyngeal space (bordering the left muscles of
mastication) with small amount of fluid in the retropharyngeal
region without deep drainable abscess. Small abscesses within the
inflamed left tonsil may be present although dental artifact limits
evaluation. Narrowing of the air column greater on the left.
Narrowing of left piriform sinus. Please see above.

Diffuse bilateral adenopathy most notable level 2 region.

These results were called by telephone at the time of interpretation
on 05/30/2015 at [DATE] to Madden Donjuan NP who verbally acknowledged
these results.

## 2018-02-17 DIAGNOSIS — H52223 Regular astigmatism, bilateral: Secondary | ICD-10-CM | POA: Diagnosis not present

## 2018-02-17 DIAGNOSIS — H5213 Myopia, bilateral: Secondary | ICD-10-CM | POA: Diagnosis not present

## 2018-03-17 MED FILL — BLISOVI FE 1/20 1-20 MG-MCG: 1-20 | 84 days supply | Qty: 84 | Fill #2

## 2018-05-29 ENCOUNTER — Ambulatory Visit (INDEPENDENT_AMBULATORY_CARE_PROVIDER_SITE_OTHER): Payer: Self-pay | Admitting: Nurse Practitioner

## 2018-05-29 VITALS — BP 105/75 | HR 62 | Temp 98.7°F | Resp 16 | Wt 154.4 lb

## 2018-05-29 DIAGNOSIS — R6889 Other general symptoms and signs: Secondary | ICD-10-CM

## 2018-05-29 DIAGNOSIS — J069 Acute upper respiratory infection, unspecified: Secondary | ICD-10-CM

## 2018-05-29 LAB — POCT INFLUENZA A/B
Influenza A, POC: NEGATIVE
Influenza B, POC: NEGATIVE

## 2018-05-29 MED ORDER — FLUTICASONE PROPIONATE 50 MCG/ACT NA SUSP: 2.0000 | Freq: Every day | NASAL | 0 refills | Status: DC

## 2018-05-29 NOTE — Addendum Note (Signed)
Addended by: Jackquline Berlin on: 05/29/2018 02:38 PM   Modules accepted: Orders

## 2018-05-29 NOTE — Progress Notes (Signed)
MRN: [id number] DOB: [birth year]  Subjective:   Samantha Norman is a 33 y.o. female presenting for chief complaint of Generalized Body Aches (X 2 DAY); Chills (X 2 DAYS ); Headache (X 2 DAYS); and SWOLLEN LYMPH NODES  (X 2 DAYS, LEFT SIDE OF THE NECK ) .  Reports 2 day history of sinus congestion , sinus pain and myalgia, fatigue, malaise and swollen lymph nodes. Has tried Ibuprofen for  relief. Denies fever, itchy watery eyes, red eyes, ear fullness, ear drainage, difficulty swallowing, pain with swallowing, inability to swallow, dry cough, productive cough, wheezing and shortness of breath, decreased appetite, nausea, vomiting, abdominal pain and diarrhea. Has had sick contact with co-workers and her baby does attend daycare. Denies history of seasonal allergies, history of asthma. Patient has had flu shot this season. Denies smoking. Denies any other aggravating or relieving factors, no other questions or concerns.  Patient informed she does have a 64-month-old baby at home and she would like to get an influenza test for confirmation.  "I really do not feel bad, I just want to make sure because of my baby at home".   Review of Systems  Constitutional: Positive for malaise/fatigue. Negative for chills and fever.  HENT: Positive for congestion, ear pain (left ear pressure) and sinus pain. Negative for ear discharge and sore throat.   Cardiovascular: Negative.   Neurological: Positive for headaches.    Astella has a current medication list which includes the following prescription(s): ibuprofen, norethindrone-ethinyl estradiol, and prenatal vit-fe fumarate-fa. Also has No Known Allergies.  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 Also  has a past surgical history that includes evoked brainstem auditory response (2014); brain MRI (11/2013); Wisdom tooth extraction (2008); and Dilation and curettage of uterus (N/A, 09/27/2017).   Objective:   Vitals: BP 105/75 (BP Location: Right Arm, Patient Position: Sitting, Cuff Size: Normal)   Pulse 62   Temp 98.7 F (37.1 C) (Oral)   Resp 16   Wt 154 lb 6.4 oz (70 kg)   SpO2 99%   BMI 23.83 kg/m  Physical Exam Vitals signs reviewed.  HENT:     Head: Normocephalic.     Right Ear: Tympanic membrane, ear canal and external ear normal.     Left Ear: Ear canal and external ear normal. A middle ear effusion is present.     Nose: Mucosal edema present.     Right Turbinates: Enlarged and swollen.     Left Turbinates: Enlarged and swollen.     Right Sinus: No maxillary sinus tenderness or frontal sinus tenderness.     Left Sinus: Maxillary sinus tenderness present. No frontal sinus tenderness.     Mouth/Throat:     Lips: Pink.     Mouth: Mucous membranes are moist.     Pharynx: Oropharynx is clear. Uvula midline. No pharyngeal swelling, oropharyngeal exudate or posterior oropharyngeal erythema.     Tonsils: No tonsillar exudate. Swelling: 0 on the right. 0 on the left.  Neck:     Musculoskeletal: Normal range of motion and neck supple.     Comments: History of thyroid nodule, unable to appreciate cervical adenopathy Cardiovascular:     Rate and Rhythm: Normal rate and regular rhythm.  Heart sounds: Normal heart sounds.  Pulmonary:     Effort: Pulmonary effort is normal.     Breath sounds: Normal breath sounds.  Abdominal:     General: Bowel sounds are normal.     Palpations: Abdomen is soft.     Tenderness: There is no abdominal tenderness.  Skin:    General: Skin is warm and dry.  Neurological:     Mental Status: She is alert.  Psychiatric:        Mood and Affect: Mood normal.         Behavior: Behavior normal.    Results for orders placed or performed in visit on 05/29/18 (from the past 24 hour(s))  POCT Influenza A/B     Status: Normal   Collection Time: 05/29/18 12:05 PM  Result Value Ref Range   Influenza A, POC Negative Negative   Influenza B, POC Negative Negative    Assessment and Plan :   Exam findings, diagnosis etiology and medication use and indications reviewed with patient. Follow- Up and discharge instructions provided. No emergent/urgent issues found on exam.  Based on the patient's clinical presentation, symptoms, physical exam, and negative influenza test, feel patient symptoms are congruent with that of viral etiology.  Patient does not display for diagnosis of influenza in a typical manner, as she has no fever, no cough, no sore throat, no headache, and only has mild symptoms.  Patient informs during the HPI stating "I really do not feel bad, I just want to make sure because of my baby at home".  Will provide the patient with Flonase for symptomatic treatment for her maxillary sinus pressure found on exam, otherwise will have patient perform symptomatic treatment at home for her symptoms.  Patient education was provided. Patient verbalized understanding of information provided and agrees with plan of care (POC), all questions answered. The patient is advised to call or return to clinic if condition does not see an improvement in symptoms, or to seek the care of the closest emergency department if condition worsens with the above plan.   1. Flu-like symptoms  - POCT Influenza A/B  2. Viral upper respiratory infection  -Take medication as prescribed. -Ibuprofen or Tylenol for pain, fever, or general discomfort. -Increase fluids. -Sleep elevated on at least 2 pillows at bedtime to help with cough if needed. -Use a humidifier or vaporizer when at home and during sleep to help with cough if needed. -Purchase OTC Delsym cough medicine.  Use as  directed. -May use a teaspoon of honey or over-the-counter cough drops to help with cough if needed. -May also use normal saline nasal spray to help with congestion. -Follow-up if symptoms do not improve.

## 2018-05-29 NOTE — Patient Instructions (Signed)
Upper Respiratory Infection, Adult -Take medication as prescribed. -Ibuprofen or Tylenol for pain, fever, or general discomfort. -Increase fluids. -Sleep elevated on at least 2 pillows at bedtime to help with cough if needed. -Use a humidifier or vaporizer when at home and during sleep to help with cough if needed. -Purchase OTC Delsym cough medicine.  Use as directed. -May use a teaspoon of honey or over-the-counter cough drops to help with cough if needed. -May also use normal saline nasal spray to help with congestion. -Follow-up if symptoms do not improve.  An upper respiratory infection (URI) is a common viral infection of the nose, throat, and upper air passages that lead to the lungs. The most common type of URI is the common cold. URIs usually get better on their own, without medical treatment. What are the causes? A URI is caused by a virus. You may catch a virus by:  Breathing in droplets from an infected person's cough or sneeze.  Touching something that has been exposed to the virus (contaminated) and then touching your mouth, nose, or eyes. What increases the risk? You are more likely to get a URI if:  You are very young or very old.  It is autumn or winter.  You have close contact with others, such as at a daycare, school, or health care facility.  You smoke.  You have long-term (chronic) heart or lung disease.  You have a weakened disease-fighting (immune) system.  You have nasal allergies or asthma.  You are experiencing a lot of stress.  You work in an area that has poor air circulation.  You have poor nutrition. What are the signs or symptoms? A URI usually involves some of the following symptoms:  Runny or stuffy (congested) nose.  Sneezing.  Cough.  Sore throat.  Headache.  Fatigue.  Fever.  Loss of appetite.  Pain in your forehead, behind your eyes, and over your cheekbones (sinus pain).  Muscle aches.  Redness or irritation of the  eyes.  Pressure in the ears or face. How is this diagnosed? This condition may be diagnosed based on your medical history and symptoms, and a physical exam. Your health care provider may use a cotton swab to take a mucus sample from your nose (nasal swab). This sample can be tested to determine what virus is causing the illness. How is this treated? URIs usually get better on their own within 7-10 days. You can take steps at home to relieve your symptoms. Medicines cannot cure URIs, but your health care provider may recommend certain medicines to help relieve symptoms, such as:  Over-the-counter cold medicines.  Cough suppressants. Coughing is a type of defense against infection that helps to clear the respiratory system, so take these medicines only as recommended by your health care provider.  Fever-reducing medicines. Follow these instructions at home: Activity  Rest as needed.  If you have a fever, stay home from work or school until your fever is gone or until your health care provider says you are no longer contagious. Your health care provider may have you wear a face mask to prevent your infection from spreading. Relieving symptoms  Gargle with a salt-water mixture 3-4 times a day or as needed. To make a salt-water mixture, completely dissolve -1 tsp of salt in 1 cup of warm water.  Use a cool-mist humidifier to add moisture to the air. This can help you breathe more easily. Eating and drinking   Drink enough fluid to keep your urine pale  yellow.  Eat soups and other clear broths. General instructions   Take over-the-counter and prescription medicines only as told by your health care provider. These include cold medicines, fever reducers, and cough suppressants.  Do not use any products that contain nicotine or tobacco, such as cigarettes and e-cigarettes. If you need help quitting, ask your health care provider.  Stay away from secondhand smoke.  Stay up to date on  all immunizations, including the yearly (annual) flu vaccine.  Keep all follow-up visits as told by your health care provider. This is important. How to prevent the spread of infection to others   URIs can be passed from person to person (are contagious). To prevent the infection from spreading: ? Wash your hands often with soap and water. If soap and water are not available, use hand sanitizer. ? Avoid touching your mouth, face, eyes, or nose. ? Cough or sneeze into a tissue or your sleeve or elbow instead of into your hand or into the air. Contact a health care provider if:  You are getting worse instead of better.  You have a fever or chills.  Your mucus is brown or red.  You have yellow or brown discharge coming from your nose.  You have pain in your face, especially when you bend forward.  You have swollen neck glands.  You have pain while swallowing.  You have white areas in the back of your throat. Get help right away if:  You have shortness of breath that gets worse.  You have severe or persistent: ? Headache. ? Ear pain. ? Sinus pain. ? Chest pain.  You have chronic lung disease along with any of the following: ? Wheezing. ? Prolonged cough. ? Coughing up blood. ? A change in your usual mucus.  You have a stiff neck.  You have changes in your: ? Vision. ? Hearing. ? Thinking. ? Mood. Summary  An upper respiratory infection (URI) is a common infection of the nose, throat, and upper air passages that lead to the lungs.  A URI is caused by a virus.  URIs usually get better on their own within 7-10 days.  Medicines cannot cure URIs, but your health care provider may recommend certain medicines to help relieve symptoms. This information is not intended to replace advice given to you by your health care provider. Make sure you discuss any questions you have with your health care provider. Document Released: 09/09/2000 Document Revised: 10/30/2016  Document Reviewed: 10/30/2016 Elsevier Interactive Patient Education  2019 [employer].

## 2018-05-31 ENCOUNTER — Telehealth: Payer: Self-pay

## 2018-05-31 NOTE — Telephone Encounter (Signed)
Patient did not answered the phone 

## 2018-06-10 MED FILL — BLISOVI FE 1/20 1-20 MG-MCG: 1-20 | 84 days supply | Qty: 84 | Fill #3

## 2018-12-26 ENCOUNTER — Other Ambulatory Visit: Payer: Self-pay

## 2018-12-26 DIAGNOSIS — Z20822 Contact with and (suspected) exposure to covid-19: Secondary | ICD-10-CM

## 2018-12-27 LAB — SPECIMEN STATUS REPORT

## 2018-12-27 LAB — NOVEL CORONAVIRUS, NAA: SARS-CoV-2, NAA: NOT DETECTED

## 2019-03-31 NOTE — L&D Delivery Note (Signed)
Delivery Note At 2:17 PM a viable female was delivered via Vaginal, Spontaneous (Presentation: Right Occiput Anterior).  APGAR: 9, 9; weight pending.   Placenta status: Spontaneous, Intact.  Cord: 3 vessels with the following complications: None.  Cord pH: n/a  Anesthesia: Epidural Episiotomy: None Lacerations: 1st degree;Perineal Suture Repair: 3.0 vicryl rapide Est. Blood Loss (mL): 200  Mom to postpartum.  Baby to Couplet care / Skin to Skin.  Mitchel Honour 11/21/2019, 2:39 PM

## 2019-04-28 ENCOUNTER — Inpatient Hospital Stay (HOSPITAL_COMMUNITY)
Admission: AD | Admit: 2019-04-28 | Discharge: 2019-04-29 | Disposition: A | Discharge status: Home / Self Care | Payer: No Typology Code available for payment source | Attending: Obstetrics and Gynecology | Admitting: Obstetrics and Gynecology

## 2019-04-28 ENCOUNTER — Encounter (HOSPITAL_COMMUNITY): Payer: Self-pay | Admitting: Obstetrics and Gynecology

## 2019-04-28 ENCOUNTER — Other Ambulatory Visit: Payer: Self-pay

## 2019-04-28 DIAGNOSIS — O209 Hemorrhage in early pregnancy, unspecified: Secondary | ICD-10-CM | POA: Diagnosis not present

## 2019-04-28 DIAGNOSIS — Z791 Long term (current) use of non-steroidal anti-inflammatories (NSAID): Secondary | ICD-10-CM | POA: Diagnosis not present

## 2019-04-28 DIAGNOSIS — R109 Unspecified abdominal pain: Secondary | ICD-10-CM | POA: Diagnosis not present

## 2019-04-28 DIAGNOSIS — N83202 Unspecified ovarian cyst, left side: Secondary | ICD-10-CM | POA: Insufficient documentation

## 2019-04-28 DIAGNOSIS — O3481 Maternal care for other abnormalities of pelvic organs, first trimester: Secondary | ICD-10-CM | POA: Diagnosis not present

## 2019-04-28 DIAGNOSIS — Z3A10 10 weeks gestation of pregnancy: Secondary | ICD-10-CM | POA: Insufficient documentation

## 2019-04-28 DIAGNOSIS — O418X10 Other specified disorders of amniotic fluid and membranes, first trimester, not applicable or unspecified: Secondary | ICD-10-CM

## 2019-04-28 LAB — POCT PREGNANCY, URINE: Preg Test, Ur: POSITIVE — AB

## 2019-04-28 NOTE — MAU Note (Addendum)
Pt reports lying with daughter and around 930 pm felt gush of fluid in her pants.  Went to bathroom and she saw a lot of blood with no clots.  Pt reports LMP 02/18/19 which makes her 9w 6d today. Pt called OB office and they advised her to come to MAU.    Addison Naegeli, RN

## 2019-04-29 ENCOUNTER — Inpatient Hospital Stay (HOSPITAL_COMMUNITY): Payer: No Typology Code available for payment source

## 2019-04-29 DIAGNOSIS — O209 Hemorrhage in early pregnancy, unspecified: Secondary | ICD-10-CM

## 2019-04-29 DIAGNOSIS — Z3A10 10 weeks gestation of pregnancy: Secondary | ICD-10-CM

## 2019-04-29 LAB — URINALYSIS, ROUTINE W REFLEX MICROSCOPIC
Bilirubin Urine: NEGATIVE
Glucose, UA: NEGATIVE mg/dL
Ketones, ur: NEGATIVE mg/dL
Leukocytes,Ua: NEGATIVE
Nitrite: NEGATIVE
Protein, ur: 30 mg/dL — AB
RBC / HPF: >50 RBC/hpf — ABNORMAL HIGH (ref 0–5)
Specific Gravity, Urine: 1.025 (ref 1.005–1.030)
pH: 6 (ref 5.0–8.0)

## 2019-04-29 LAB — WET PREP, GENITAL
Clue Cells Wet Prep HPF POC: NONE SEEN
Sperm: NONE SEEN
Trich, Wet Prep: NONE SEEN
Yeast Wet Prep HPF POC: NONE SEEN

## 2019-04-29 NOTE — MAU Provider Note (Signed)
History     CSN: [id number]  Arrival date and time: 04/28/19 2325   First Provider Initiated Contact with Patient 04/29/19 0011      Chief Complaint  Patient presents with  . Possible Pregnancy  . Vaginal Bleeding  . Abdominal Pain   Samantha Norman is a 34 y.o. G3P1011 at 88w0dby Definite LMP of 02/18/2019.  She receives care at [facility].  She presents today for  Vaginal Bleeding and Abdominal Pain.  She states she woke up around 1030 and was soaked.  She states she was wearing black pajamas and underwear and couldn't see if it was urine or blood.  However, she states upon going to the bathroom she was "just gushing" blood.  She denies passing clots and endorses some abdominal cramping and tenderness. She denies sexual activity in the past 3 days or issues with urination or diarrhea.  She does endorse some constipation "the last few days."  She reports that she continued to notice spotting upon arrival to the MAU.  Patient states she had an ultrasound at her office which showed an [place] heartbeat about 4 days behind her due date.       OB History    Gravida  3   Para  1   Term  1   Preterm  0   AB  1   Living  1     SAB  1   TAB  0   Ectopic  0   Multiple  0   Live Births  1           Past Medical History:  Diagnosis Date  . Dizziness    "goes along w/my migraines"  . Family history of adverse reaction to anesthesia    "Mom is overweight, has sleep apnea; has breathing issues w/anesthesia"  . History of chicken pox   . History of depression   . 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 [place]bar vertebral fracture ([place]   "sports related; L5; no OR"  . Migraine headache    "monthly even w/my RX" (05/31/2015)  . Mononucleosis dx'd 05/2015  . Pneumonia ~ 2010  . [place]ca syndrome ([place] 10/04/2015   Sicca syndrome with polyarthralgia. Established with Dr HTrudie Reedrheum - glandular without systemic manifestations - continue topical treatment, no need for  systemic therapy at this time. ANA neg, ESR 2, CRP 0.6, anti CCP 5 (10/2015)   . Spondylolysis of lumbosacral region 2004   chronic R L5  . Thyroid nodule 01/23/2016   Right lower thyroid gland 6 mm nodule by CT (05/2015)    Past Surgical History:  Procedure Laterality Date  . brain MRI  11/2013   WNL, no demyelinating disease  . DILATION AND CURETTAGE OF UTERUS N/A 09/27/2017   Procedure: DILATATION AND CURETTAGE with ultrasound guidance;  Surgeon: GDian Queen MD;  [facility];  Service: Gynecology;  Laterality: N/A;  . DILATION AND CURETTAGE OF UTERUS  2019  . evoked brainstem auditory response  2014   WNL ([facility])  . WISDOM TOOTH EXTRACTION  2008    Family History  Problem Relation Age of Onset  . Hypertension Mother   . Cancer Mother 551      breast  . Alzheimer's disease Maternal Uncle 527      early onset  . Cancer Paternal Uncle        prostate  . Cancer Paternal Grandfather        prostate  . Stroke  Father 58       unclear cause - small vessel disease  . Hypertension Father   . Stroke Paternal Grandmother 52       multiple   . Stroke Paternal Uncle 8  . CAD Maternal Grandmother 80  . Diabetes Paternal Uncle     Social History   Tobacco Use  . Smoking status: Never Smoker  . Smokeless tobacco: Never Used  Substance Use Topics  . Alcohol use: No    Alcohol/week: 0.0 standard drinks    Comment: occasionally  . Drug use: No    Allergies: No Known Allergies  Medications Prior to Admission  Medication Sig Dispense Refill Last Dose  . Prenatal Multivit-Min-Fe-FA (PRENATAL VITAMINS PO) Take 1 tablet by mouth daily.    04/28/2019 at Unknown time  . fluticasone (FLONASE) 50 MCG/ACT nasal spray Place 2 sprays into both nostrils daily for 10 days. 16 g 0   . ibuprofen (ADVIL,MOTRIN) 200 MG tablet Take 200 mg by mouth every 6 (six) hours as needed.     . norethindrone-ethinyl estradiol (JUNEL FE,GILDESS FE,LOESTRIN FE) 1-20 MG-MCG tablet Take 1 tablet by mouth  daily.  4     Review of Systems  Constitutional: Negative for chills and fever.  Respiratory: Negative for cough and shortness of breath.   Gastrointestinal: Positive for abdominal pain, constipation and nausea. Negative for diarrhea and vomiting.  Genitourinary: Positive for vaginal bleeding. Negative for difficulty urinating, dysuria, pelvic pain and vaginal discharge ("Regular white").  Neurological: Negative for dizziness, light-headedness and headaches.   Physical Exam   Blood pressure 111/73, pulse (!) 106, temperature 98.6 F (37 C), temperature source Oral, resp. rate 18, weight 71.6 kg, last menstrual period 02/18/2019, SpO2 100 %, unknown if currently breastfeeding.  Physical Exam  Constitutional: She is oriented to person, place, and time. She appears well-developed and well-nourished. No distress.  HENT:  Head: Normocephalic and atraumatic.  Eyes: Conjunctivae are normal.  Cardiovascular: Normal rate, regular rhythm and normal heart sounds.  Respiratory: Effort normal and breath sounds normal.  GI: Soft.  Genitourinary: Uterus is enlarged.    Vaginal bleeding present.  There is bleeding in the vagina.    Genitourinary Comments: -Normal External Genitalia: Non tender, Bloody discharge noted at introitus.  -Vaginal Vault: Pink mucosa with good rugae. Moderate amt watery blood tinged discharge -wet prep collected -Cervix: Positioned anteriorly behind pubic bone. Pink, no lesions, cysts, or polyps.  Appears closed. No active bleeding from os-GC/CT collected -Bimanual Exam: Difficult to assess uterine size d/t position, but enlargement noted.   Musculoskeletal:        General: Normal range of motion.     Cervical back: Normal range of motion.  Neurological: She is alert and oriented to person, place, and time.  Skin: Skin is warm and dry.  Psychiatric: She has a normal mood and affect. Her behavior is normal.    MAU Course  Procedures Results for orders placed or  performed during the hospital encounter of 04/28/19 (from the past 24 hour(s))  Pregnancy, urine POC     Status: Abnormal   Collection Time: 04/28/19 11:35 PM  Result Value Ref Range   Preg Test, Ur POSITIVE (A) NEGATIVE  Wet prep, genital     Status: Abnormal   Collection Time: 04/29/19 12:27 AM   Specimen: PATH Cytology Cervicovaginal Ancillary Only  Result Value Ref Range   Yeast Wet Prep HPF POC NONE SEEN NONE SEEN   Trich, Wet Prep NONE SEEN NONE SEEN  Clue Cells Wet Prep HPF POC NONE SEEN NONE SEEN   WBC, Wet Prep HPF POC MODERATE (A) NONE SEEN   Sperm NONE SEEN   Urinalysis, Routine w reflex microscopic     Status: Abnormal   Collection Time: 04/29/19 12:28 AM  Result Value Ref Range   Color, Urine YELLOW YELLOW   APPearance HAZY (A) CLEAR   Specific Gravity, Urine 1.025 1.005 - 1.030   pH 6.0 5.0 - 8.0   Glucose, UA NEGATIVE NEGATIVE mg/dL   Hgb urine dipstick LARGE (A) NEGATIVE   Bilirubin Urine NEGATIVE NEGATIVE   Ketones, ur NEGATIVE NEGATIVE mg/dL   Protein, ur 30 (A) NEGATIVE mg/dL   Nitrite NEGATIVE NEGATIVE   Leukocytes,Ua NEGATIVE NEGATIVE   RBC / HPF >50 (H) 0 - 5 RBC/hpf   WBC, UA 0-5 0 - 5 WBC/hpf   Bacteria, UA RARE (A) NONE SEEN   Squamous Epithelial / LPF 0-5 0 - 5   Mucus PRESENT    Ca Oxalate Crys, UA PRESENT    US OB LESS THAN 14 WEEKS WITH OB TRANSVAGINAL  Result Date: 04/29/2019 CLINICAL DATA:  Pregnant patient in first-trimester pregnancy with vaginal bleeding. EXAM: OBSTETRIC <14 WK [place] AND TRANSVAGINAL OB US TECHNIQUE: Both transabdominal and transvaginal ultrasound examinations were performed for complete evaluation of the gestation as well as the maternal uterus, adnexal regions, and pelvic cul-de-sac. Transvaginal technique was performed to assess early pregnancy. COMPARISON:  None. FINDINGS: Intrauterine gestational sac: Single Yolk sac:  Visualized. Embryo:  Visualized. Cardiac Activity: Visualized. Heart Rate: 191 bpm CRL:  26.3 mm   9 w   3 d                   [place] EDC: 11/29/2019 Subchorionic hemorrhage: Moderate measuring approximately 2.7 x 2.4 x 1.7 cm. Maternal uterus/adnexae: Both ovaries are visualized and are normal with blood flow. Corpus luteal cyst in the left ovary. No suspicious adnexal mass. No pelvic free fluid. IMPRESSION: 1. Single live intrauterine pregnancy estimated gestational age [redacted] weeks 3 days based on crown-rump length for ultrasound [facility] 11/29/2019. 2. Moderate subchorionic hemorrhage. Electronically Signed   By: Keith Rake M.D.   On: 04/29/2019 01:15    MDM Physical Exam Labs: UA, Wet Prep, GC/CT [place] Assessment and Plan  34 year old, G3P1011  SIUP at 10.0 weeks Vaginal Bleeding  -Reviewed POC with patient. -Exam performed and findings discussed.  -Informed that blood appears watery in nature, but no active bleeding from closed os. -Cultures collected and pending. -Will send for [place]; informed of potential findings including notable reason for bleeding, no identifiable reason, and/or loss. -Patient verbalizes understanding and has no questions or concerns.  Maryann Conners 04/29/2019, 12:11 AM   Reassessment (1:27 AM) [facility]  -[place] returns with viable IUP at 9.3 weeks -Wet prep returns negative. -In room to discuss results with patient who is relieved and tearful with news. -Educated on [facility], what to expect including bleeding, risks for miscarriage, and resolution.  -Encouraged to abstain from sex for at least 72 hours after bleeding incidents. -Encouraged to call or report to MAU for any bleeding that is of concern. -Patient informed no need for bedrest and that she does not have to restrict lifting of toddler daughter. -Encouraged to call primary ob or return to MAU if symptoms worsen or with the onset of new symptoms. -Congratulations Given. -Discharged to home in stable condition.  Maryann Conners MSN, CNM Advanced Practice Provider, [facility]

## 2019-04-29 NOTE — Discharge Instructions (Signed)

## 2019-05-01 LAB — GC/CHLAMYDIA PROBE AMP (~~LOC~~) NOT AT ARMC
Chlamydia: NEGATIVE
Comment: NEGATIVE
Comment: NORMAL
Neisseria Gonorrhea: NEGATIVE

## 2019-05-17 ENCOUNTER — Ambulatory Visit: Payer: No Typology Code available for payment source | Attending: Internal Medicine

## 2019-05-17 DIAGNOSIS — Z20822 Contact with and (suspected) exposure to covid-19: Secondary | ICD-10-CM

## 2019-05-19 LAB — NOVEL CORONAVIRUS, NAA: SARS-CoV-2, NAA: NOT DETECTED

## 2019-08-14 ENCOUNTER — Other Ambulatory Visit: Payer: Self-pay | Admitting: Family Medicine

## 2019-08-14 DIAGNOSIS — R202 Paresthesia of skin: Secondary | ICD-10-CM

## 2019-08-14 DIAGNOSIS — E041 Nontoxic single thyroid nodule: Secondary | ICD-10-CM

## 2019-08-14 DIAGNOSIS — K9041 Non-celiac gluten sensitivity: Secondary | ICD-10-CM

## 2019-08-14 DIAGNOSIS — E7800 Pure hypercholesterolemia, unspecified: Secondary | ICD-10-CM

## 2019-08-14 DIAGNOSIS — M3500 Sicca syndrome, unspecified: Secondary | ICD-10-CM

## 2019-08-15 ENCOUNTER — Other Ambulatory Visit: Payer: Self-pay

## 2019-08-15 ENCOUNTER — Other Ambulatory Visit (INDEPENDENT_AMBULATORY_CARE_PROVIDER_SITE_OTHER): Payer: No Typology Code available for payment source

## 2019-08-15 DIAGNOSIS — E041 Nontoxic single thyroid nodule: Secondary | ICD-10-CM | POA: Diagnosis not present

## 2019-08-15 DIAGNOSIS — M3500 Sicca syndrome, unspecified: Secondary | ICD-10-CM | POA: Diagnosis not present

## 2019-08-15 DIAGNOSIS — E7800 Pure hypercholesterolemia, unspecified: Secondary | ICD-10-CM

## 2019-08-15 LAB — CBC WITH DIFFERENTIAL/PLATELET
Basophils Absolute: 0.1 10*3/uL (ref 0.0–0.1)
Basophils Relative: 0.7 % (ref 0.0–3.0)
Eosinophils Absolute: 0.1 10*3/uL (ref 0.0–0.7)
Eosinophils Relative: 1.5 % (ref 0.0–5.0)
HCT: 33.7 % — ABNORMAL LOW (ref 36.0–46.0)
Hemoglobin: 11.8 g/dL — ABNORMAL LOW (ref 12.0–15.0)
Lymphocytes Relative: 25.4 % (ref 12.0–46.0)
Lymphs Abs: 2 10*3/uL (ref 0.7–4.0)
MCHC: 35.1 g/dL (ref 30.0–36.0)
MCV: 96.3 fl (ref 78.0–100.0)
Monocytes Absolute: 0.5 10*3/uL (ref 0.1–1.0)
Monocytes Relative: 6 % (ref 3.0–12.0)
Neutro Abs: 5.2 10*3/uL (ref 1.4–7.7)
Neutrophils Relative %: 66.4 % (ref 43.0–77.0)
Platelets: 226 10*3/uL (ref 150.0–400.0)
RBC: 3.5 Mil/uL — ABNORMAL LOW (ref 3.87–5.11)
RDW: 13.6 % (ref 11.5–15.5)
WBC: 7.8 10*3/uL (ref 4.0–10.5)

## 2019-08-15 LAB — COMPREHENSIVE METABOLIC PANEL
ALT: 11 U/L (ref 0–35)
AST: 13 U/L (ref 0–37)
Albumin: 3.6 g/dL (ref 3.5–5.2)
Alkaline Phosphatase: 29 U/L — ABNORMAL LOW (ref 39–117)
BUN: 6 mg/dL (ref 6–23)
CO2: 24 mEq/L (ref 19–32)
Calcium: 8.5 mg/dL (ref 8.4–10.5)
Chloride: 107 mEq/L (ref 96–112)
Creatinine, Ser: 0.55 mg/dL (ref 0.40–1.20)
GFR: 127.52 mL/min (ref >60.00)
Glucose, Bld: 87 mg/dL (ref 70–99)
Potassium: 4.2 mEq/L (ref 3.5–5.1)
Sodium: 135 mEq/L (ref 135–145)
Total Bilirubin: 0.6 mg/dL (ref 0.2–1.2)
Total Protein: 6.2 g/dL (ref 6.0–8.3)

## 2019-08-15 LAB — LIPID PANEL
Cholesterol: 282 mg/dL — ABNORMAL HIGH (ref 0–200)
HDL: 49.1 mg/dL (ref >39.00)
LDL Cholesterol: 194 mg/dL — ABNORMAL HIGH (ref 0–99)
NonHDL: 233.03
Total CHOL/HDL Ratio: 6
Triglycerides: 195 mg/dL — ABNORMAL HIGH (ref 0.0–149.0)
VLDL: 39 mg/dL (ref 0.0–40.0)

## 2019-08-16 LAB — TSH: TSH: 1.88 u[IU]/mL (ref 0.35–4.50)

## 2019-08-16 LAB — FERRITIN: Ferritin: 7.5 ng/mL — ABNORMAL LOW (ref 10.0–291.0)

## 2019-08-16 LAB — ANA: Anti Nuclear Antibody (ANA): NEGATIVE

## 2019-08-22 ENCOUNTER — Encounter: Payer: Self-pay | Admitting: Family Medicine

## 2019-08-22 ENCOUNTER — Other Ambulatory Visit: Payer: Self-pay

## 2019-08-22 ENCOUNTER — Ambulatory Visit (INDEPENDENT_AMBULATORY_CARE_PROVIDER_SITE_OTHER): Payer: No Typology Code available for payment source | Admitting: Family Medicine

## 2019-08-22 VITALS — BP 116/64 | HR 93 | Temp 97.7°F | Ht 68.0 in | Wt 172.2 lb

## 2019-08-22 DIAGNOSIS — K9041 Non-celiac gluten sensitivity: Secondary | ICD-10-CM | POA: Diagnosis not present

## 2019-08-22 DIAGNOSIS — E611 Iron deficiency: Secondary | ICD-10-CM | POA: Insufficient documentation

## 2019-08-22 DIAGNOSIS — E7800 Pure hypercholesterolemia, unspecified: Secondary | ICD-10-CM

## 2019-08-22 DIAGNOSIS — E041 Nontoxic single thyroid nodule: Secondary | ICD-10-CM | POA: Diagnosis not present

## 2019-08-22 DIAGNOSIS — M3500 Sicca syndrome, unspecified: Secondary | ICD-10-CM

## 2019-08-22 DIAGNOSIS — E785 Hyperlipidemia, unspecified: Secondary | ICD-10-CM | POA: Insufficient documentation

## 2019-08-22 DIAGNOSIS — R748 Abnormal levels of other serum enzymes: Secondary | ICD-10-CM

## 2019-08-22 DIAGNOSIS — Z0000 Encounter for general adult medical examination without abnormal findings: Secondary | ICD-10-CM

## 2019-08-22 NOTE — Assessment & Plan Note (Addendum)
Dilutional anemia of pregnancy with evidence of iron deficiency (low iron stores).  She will check on iron content in prenatal.

## 2019-08-22 NOTE — Patient Instructions (Addendum)
Check on iron content of prenatal. Cholesterol was high! I suggest rechecking levels in 6-9 months (schedule fasting labs at that time).  You are doing well today Congrats on the new baby! Return as needed or in 2 years for next physical.   Health Maintenance, Female Adopting a healthy lifestyle and getting preventive care are important in promoting health and wellness. Ask your health care provider about:  The right schedule for you to have regular tests and exams.  Things you can do on your own to prevent diseases and keep yourself healthy. What should I know about diet, weight, and exercise? Eat a healthy diet   Eat a diet that includes plenty of vegetables, fruits, low-fat dairy products, and lean protein.  Do not eat a lot of foods that are high in solid fats, added sugars, or sodium. Maintain a healthy weight Body mass index (BMI) is used to identify weight problems. It estimates body fat based on height and weight. Your health care provider can help determine your BMI and help you achieve or maintain a healthy weight. Get regular exercise Get regular exercise. This is one of the most important things you can do for your health. Most adults should:  Exercise for at least 150 minutes each week. The exercise should increase your heart rate and make you sweat (moderate-intensity exercise).  Do strengthening exercises at least twice a week. This is in addition to the moderate-intensity exercise.  Spend less time sitting. Even light physical activity can be beneficial. Watch cholesterol and blood lipids Have your blood tested for lipids and cholesterol at 33 years of age, then have this test every 5 years. Have your cholesterol levels checked more often if:  Your lipid or cholesterol levels are high.  You are older than 33 years of age.  You are at high risk for heart disease. What should I know about cancer screening? Depending on your health history and family history, you  may need to have cancer screening at various ages. This may include screening for:  Breast cancer.  Cervical cancer.  Colorectal cancer.  Skin cancer.  Lung cancer. What should I know about heart disease, diabetes, and high blood pressure? Blood pressure and heart disease  High blood pressure causes heart disease and increases the risk of stroke. This is more likely to develop in people who have high blood pressure readings, are of African descent, or are overweight.  Have your blood pressure checked: ? Every 3-5 years if you are 49-48 years of age. ? Every year if you are 53 years old or older. Diabetes Have regular diabetes screenings. This checks your fasting blood sugar level. Have the screening done:  Once every three years after age 29 if you are at a normal weight and have a low risk for diabetes.  More often and at a younger age if you are overweight or have a high risk for diabetes. What should I know about preventing infection? Hepatitis B If you have a higher risk for hepatitis B, you should be screened for this virus. Talk with your health care provider to find out if you are at risk for hepatitis B infection. Hepatitis C Testing is recommended for:  Everyone born from [birth year] through 1964.  Anyone with known risk factors for hepatitis C. Sexually transmitted infections (STIs)  Get screened for STIs, including gonorrhea and chlamydia, if: ? You are sexually active and are younger than 33 years of age. ? You are older than 33 years of  age and your health care provider tells you that you are at risk for this type of infection. ? Your sexual activity has changed since you were last screened, and you are at increased risk for chlamydia or gonorrhea. Ask your health care provider if you are at risk.  Ask your health care provider about whether you are at high risk for HIV. Your health care provider may recommend a prescription medicine to help prevent HIV infection. If  you choose to take medicine to prevent HIV, you should first get tested for HIV. You should then be tested every 3 months for as long as you are taking the medicine. Pregnancy  If you are about to stop having your period (premenopausal) and you may become pregnant, seek counseling before you get pregnant.  Take 400 to 800 micrograms (mcg) of folic acid every day if you become pregnant.  Ask for birth control (contraception) if you want to prevent pregnancy. Osteoporosis and menopause Osteoporosis is a disease in which the bones lose minerals and strength with aging. This can result in bone fractures. If you are 43 years old or older, or if you are at risk for osteoporosis and fractures, ask your health care provider if you should:  Be screened for bone loss.  Take a calcium or vitamin D supplement to lower your risk of fractures.  Be given hormone replacement therapy (HRT) to treat symptoms of menopause. Follow these instructions at home: Lifestyle  Do not use any products that contain nicotine or tobacco, such as cigarettes, e-cigarettes, and chewing tobacco. If you need help quitting, ask your health care provider.  Do not use street drugs.  Do not share needles.  Ask your health care provider for help if you need support or information about quitting drugs. Alcohol use  Do not drink alcohol if: ? Your health care provider tells you not to drink. ? You are pregnant, may be pregnant, or are planning to become pregnant.  If you drink alcohol: ? Limit how much you use to 0-1 drink a day. ? Limit intake if you are breastfeeding.  Be aware of how much alcohol is in your drink. In the [place] one drink equals one 12 oz bottle of beer (355 mL), one 5 oz glass of wine (148 mL), or one 1 oz glass of hard liquor (44 mL). General instructions  Schedule regular health, dental, and eye exams.  Stay current with your vaccines.  Tell your health care provider if: ? You often feel  depressed. ? You have ever been abused or do not feel safe at home. Summary  Adopting a healthy lifestyle and getting preventive care are important in promoting health and wellness.  Follow your health care provider's instructions about healthy diet, exercising, and getting tested or screened for diseases.  Follow your health care provider's instructions on monitoring your cholesterol and blood pressure. This information is not intended to replace advice given to you by your health care provider. Make sure you discuss any questions you have with your health care provider. Document Revised: 03/09/2018 Document Reviewed: 03/09/2018 Elsevier Patient Education  2020 [employer].

## 2019-08-22 NOTE — Assessment & Plan Note (Addendum)
Largely stable period. Consider b12, zinc after pregnancy if staying low.

## 2019-08-22 NOTE — Assessment & Plan Note (Addendum)
Stable period. Recent ANA negative.  Biotene was ineffective.

## 2019-08-22 NOTE — Progress Notes (Signed)
This visit was conducted in person.  BP 116/64 (BP Location: Left Arm, Patient Position: Sitting, Cuff Size: Normal)   Pulse 93   Temp 97.7 F (36.5 C) (Temporal)   Ht 5\' 8"  (1.727 m)   Wt 172 lb 4 oz (78.1 kg)   LMP 02/18/2019 (Exact Date)   SpO2 99%   BMI 26.19 kg/m    CC: CPE Subjective:    Patient ID: Samantha Norman, female    DOB: 07-06-1985, 34 y.o.   MRN: [id number]  HPI: Samantha Norman is a 34 y.o. female presenting on 08/22/2019 for Annual Exam   Newly pregnant! Expecting 2nd daughter 10/2019.  Pregnancy complicated by subchorionic hemorrhage, just recently self resolved - just received clearance to increase activity levels.   H/o gluten sensitivity.  Fmhx stroke in father due to afib (age 71s). Both parents are on statin.   Preventative: Well woman with [facility] (Dr Lynnette Caffey).  Flu shot yearly Tdap 05/2017 COVID vaccine - deferring for now given pregnancy  Seat belt use discussed  Sunscreen use discussed. No changing moles on skin. Non smoker  Alcohol - none  Dentist Q6 mo Eye exam yearly  Married. 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 Interim medical history since our last visit reviewed. Allergies and medications reviewed and updated. Outpatient Medications Prior to Visit  Medication Sig Dispense Refill  . Prenatal Multivit-Min-Fe-FA (PRENATAL VITAMINS PO) Take 1 tablet by mouth daily.     . fluticasone (FLONASE) 50 MCG/ACT nasal spray Place 2 sprays into both nostrils daily for 10 days. 16 g 0   No facility-administered medications prior to visit.     Per HPI unless specifically indicated in ROS section below Review of  Systems  Constitutional: Negative for activity change, appetite change, chills, fatigue, fever and unexpected weight change.  HENT: Negative for hearing loss.   Eyes: Negative for visual disturbance.  Respiratory: Negative for cough, chest tightness, shortness of breath and wheezing.   Cardiovascular: Negative for chest pain, palpitations and leg swelling.  Gastrointestinal: Negative for abdominal distention, abdominal pain, blood in stool, constipation, diarrhea, nausea and vomiting.  Genitourinary: Negative for difficulty urinating and hematuria.  Musculoskeletal: Negative for arthralgias, myalgias and neck pain.  Skin: Negative for rash.  Neurological: Negative for dizziness, seizures, syncope and headaches.  Hematological: Negative for adenopathy. Does not bruise/bleed easily.  Psychiatric/Behavioral: Negative for dysphoric mood. The patient is not nervous/anxious.    Objective:  BP 116/64 (BP Location: Left Arm, Patient Position: Sitting, Cuff Size: Normal)   Pulse 93   Temp 97.7 F (36.5 C) (Temporal)   Ht 5\' 8"  (1.727 m)   Wt 172 lb 4 oz (78.1 kg)   LMP 02/18/2019 (Exact Date)   SpO2 99%   BMI 26.19 kg/m   Wt Readings from Last 3 Encounters:  08/22/19 172 lb 4 oz (78.1 kg)  05/29/18 154 lb 6.4 oz (70 kg)  07/08/17 183 lb (83 kg)      Physical Exam Vitals and nursing note reviewed.  Constitutional:      General: She is not in acute distress.    Appearance: Normal appearance. She is well-developed. She is not ill-appearing.     Comments: 5  months pregnant  HENT:     Head: Normocephalic and atraumatic.     Right Ear: Hearing, tympanic membrane, ear canal and external ear normal.     Left Ear: Hearing, tympanic membrane, ear canal and external ear normal.  Eyes:     General: No scleral icterus.    Extraocular Movements: Extraocular movements intact.     Conjunctiva/sclera: Conjunctivae normal.     Pupils: Pupils are equal, round, and reactive to light.    Cardiovascular:     Rate and Rhythm: Normal rate and regular rhythm.     Pulses: Normal pulses.          Radial pulses are 2+ on the right side and 2+ on the left side.     Heart sounds: Normal heart sounds. No murmur.  Pulmonary:     Effort: Pulmonary effort is normal. No respiratory distress.     Breath sounds: Normal breath sounds. No wheezing, rhonchi or rales.  Abdominal:     General: Abdomen is flat. Bowel sounds are normal. There is no distension.     Palpations: Abdomen is soft. There is no mass.     Tenderness: There is no abdominal tenderness. There is no guarding or rebound.     Hernia: No hernia is present.  Musculoskeletal:        General: Normal range of motion.     Cervical back: Normal range of motion and neck supple.     Right lower leg: No edema.     Left lower leg: No edema.  Lymphadenopathy:     Cervical: No cervical adenopathy.  Skin:    General: Skin is warm and dry.     Findings: No rash.  Neurological:     General: No focal deficit present.     Mental Status: She is alert and oriented to person, place, and time.     Comments: CN grossly intact, station and gait intact  Psychiatric:        Mood and Affect: Mood normal.        Behavior: Behavior normal.        Thought Content: Thought content normal.        Judgment: Judgment normal.       Results for orders placed or performed in visit on 08/15/19  CBC with Differential/Platelet  Result Value Ref Range   WBC 7.8 4.0 - 10.5 K/uL   RBC 3.50 (L) 3.87 - 5.11 Mil/uL   Hemoglobin 11.8 (L) 12.0 - 15.0 g/dL   HCT 60.1 (L) 09.3 - 23.5 %   MCV 96.3 78.0 - 100.0 fl   MCHC 35.1 30.0 - 36.0 g/dL   RDW 57.3 22.0 - 25.4 %   Platelets 226.0 150.0 - 400.0 K/uL   Neutrophils Relative % 66.4 43.0 - 77.0 %   Lymphocytes Relative 25.4 12.0 - 46.0 %   Monocytes Relative 6.0 3.0 - 12.0 %   Eosinophils Relative 1.5 0.0 - 5.0 %   Basophils Relative 0.7 0.0 - 3.0 %   Neutro Abs 5.2 1.4 - 7.7 K/uL   Lymphs Abs 2.0  0.7 - 4.0 K/uL   Monocytes Absolute 0.5 0.1 - 1.0 K/uL   Eosinophils Absolute 0.1 0.0 - 0.7 K/uL   Basophils Absolute 0.1 0.0 - 0.1 K/uL  TSH  Result Value Ref Range   TSH 1.88 0.35 - 4.50 uIU/mL  Comprehensive metabolic panel  Result Value Ref Range   Sodium 135 135 - 145 mEq/L   Potassium 4.2 3.5 -  5.1 mEq/L   Chloride 107 96 - 112 mEq/L   CO2 24 19 - 32 mEq/L   Glucose, Bld 87 70 - 99 mg/dL   BUN 6 6 - 23 mg/dL   Creatinine, Ser 4.69 0.40 - 1.20 mg/dL   Total Bilirubin 0.6 0.2 - 1.2 mg/dL   Alkaline Phosphatase 29 (L) 39 - 117 U/L   AST 13 0 - 37 U/L   ALT 11 0 - 35 U/L   Total Protein 6.2 6.0 - 8.3 g/dL   Albumin 3.6 3.5 - 5.2 g/dL   GFR 629.52 >84.13 mL/min   Calcium 8.5 8.4 - 10.5 mg/dL  ANA  Result Value Ref Range   Anti Nuclear Antibody (ANA) NEGATIVE NEGATIVE  Lipid panel  Result Value Ref Range   Cholesterol 282 (H) 0 - 200 mg/dL   Triglycerides 244.0 (H) 0.0 - 149.0 mg/dL   HDL 10.27 >25.36 mg/dL   VLDL 64.4 0.0 - 03.4 mg/dL   LDL Cholesterol 742 (H) 0 - 99 mg/dL   Total CHOL/HDL Ratio 6    NonHDL 233.03   Ferritin  Result Value Ref Range   Ferritin 7.5 (L) 10.0 - 291.0 ng/mL   Assessment & Plan:  This visit occurred during the SARS-CoV-2 public health emergency.  Safety protocols were in place, including screening questions prior to the visit, additional usage of staff PPE, and extensive cleaning of exam room while observing appropriate contact time as indicated for disinfecting solutions.   Problem List Items Addressed This Visit    Thyroid nodule    Palpable, asymptomatic. Seems small. TSH normal. Reassuring [place] 2017. Continue to monitor.       Sicca syndrome (HCC)    Stable period. Recent ANA negative.  Biotene was ineffective.       Non-celiac gluten sensitivity    Tends to do better during pregnancy.  TTG IgG negative previously - may have been false negative given she already avoids gluten. Consider re eval (with IgA) in the future.         Low serum alkaline phosphatase    Largely stable period. Consider b12, zinc after pregnancy if staying low.       Iron deficiency    Dilutional anemia of pregnancy with evidence of iron deficiency (low iron stores).  She will check on iron content in prenatal.       Hyperlipidemia    Chol levels checked during pregnancy - will just reassess in 6-8 months (after delivery).  Fmhx HLD.       Relevant Orders   Lipid panel   Health maintenance examination - Primary    Preventative protocols reviewed and updated unless pt declined. Discussed healthy diet and lifestyle.           No orders of the defined types were placed in this encounter.  Orders Placed This Encounter  Procedures  . Lipid panel    Standing Status:   Future    Standing Expiration Date:   08/21/2020    Patient instructions: Check on iron content of prenatal.  Cholesterol was high! I suggest rechecking levels in 6-9 months (schedule fasting labs at that time).  You are doing well today.  Congrats on the new baby!  Return as needed or in 2 years for next physical.   Follow up plan: Return in about 2 years (around 08/21/2021) for annual exam, prior fasting for blood work.  Eustaquio Boyden, MD

## 2019-08-22 NOTE — Assessment & Plan Note (Signed)
Chol levels checked during pregnancy - will just reassess in 6-8 months (after delivery).  Fmhx HLD.

## 2019-08-22 NOTE — Assessment & Plan Note (Signed)
Preventative protocols reviewed and updated unless pt declined. Discussed healthy diet and lifestyle.  

## 2019-08-22 NOTE — Assessment & Plan Note (Signed)
Palpable, asymptomatic. Seems small. TSH normal. Reassuring [place] 2017. Continue to monitor.

## 2019-08-22 NOTE — Assessment & Plan Note (Signed)
Tends to do better during pregnancy.  TTG IgG negative previously - may have been false negative given she already avoids gluten. Consider re eval (with IgA) in the future.

## 2019-08-31 ENCOUNTER — Ambulatory Visit: Payer: No Typology Code available for payment source | Attending: Internal Medicine

## 2019-09-21 ENCOUNTER — Ambulatory Visit: Payer: No Typology Code available for payment source | Attending: Internal Medicine

## 2019-09-21 DIAGNOSIS — Z23 Encounter for immunization: Secondary | ICD-10-CM

## 2019-09-21 NOTE — Progress Notes (Signed)
   Covid-19 Vaccination Clinic  Name:  Samantha Norman    MRN: [id number] DOB: 1985-04-13  09/21/2019  Samantha Norman was observed post Covid-19 immunization for 15 minutes without incident. She was provided with Vaccine Information Sheet and instruction to access the V-Safe system.   Samantha Norman was instructed to call 911 with any severe reactions post vaccine: Marland Kitchen Difficulty breathing  . Swelling of face and throat  . A fast heartbeat  . A bad rash all over body  . Dizziness and weakness   Immunizations Administered    Name Date Dose VIS Date Route   Pfizer COVID-19 Vaccine 09/21/2019  8:39 AM 0.3 mL 05/24/2018 Intramuscular   Manufacturer: [employer], [employer]   Lot: [id number]   NDC: [address]-0

## 2019-09-27 ENCOUNTER — Encounter: Payer: Self-pay | Admitting: Family Medicine

## 2019-09-27 NOTE — Telephone Encounter (Signed)
Updated pt's chart.  

## 2019-11-07 ENCOUNTER — Encounter (HOSPITAL_COMMUNITY): Payer: Self-pay | Admitting: *Deleted

## 2019-11-07 ENCOUNTER — Telehealth (HOSPITAL_COMMUNITY): Payer: Self-pay | Admitting: *Deleted

## 2019-11-07 NOTE — Telephone Encounter (Signed)
Preadmission screen  

## 2019-11-20 ENCOUNTER — Other Ambulatory Visit (HOSPITAL_COMMUNITY)
Admission: RE | Admit: 2019-11-20 | Discharge: 2019-11-20 | Disposition: A | Discharge status: Home / Self Care | Payer: No Typology Code available for payment source | Source: Ambulatory Visit | Attending: Obstetrics & Gynecology | Admitting: Obstetrics & Gynecology

## 2019-11-20 DIAGNOSIS — Z20822 Contact with and (suspected) exposure to covid-19: Secondary | ICD-10-CM | POA: Insufficient documentation

## 2019-11-20 DIAGNOSIS — Z01812 Encounter for preprocedural laboratory examination: Secondary | ICD-10-CM | POA: Insufficient documentation

## 2019-11-20 LAB — SARS CORONAVIRUS 2 (TAT 6-24 HRS): SARS Coronavirus 2: NEGATIVE

## 2019-11-21 ENCOUNTER — Other Ambulatory Visit: Payer: Self-pay

## 2019-11-21 ENCOUNTER — Encounter (HOSPITAL_COMMUNITY): Payer: Self-pay | Admitting: Obstetrics & Gynecology

## 2019-11-21 ENCOUNTER — Inpatient Hospital Stay (HOSPITAL_COMMUNITY): Payer: No Typology Code available for payment source

## 2019-11-21 ENCOUNTER — Inpatient Hospital Stay (HOSPITAL_COMMUNITY): Payer: No Typology Code available for payment source | Admitting: Anesthesiology

## 2019-11-21 ENCOUNTER — Inpatient Hospital Stay (HOSPITAL_COMMUNITY)
Admission: AD | Admit: 2019-11-21 | Discharge: 2019-11-22 | DRG: 807 | Disposition: A | Discharge status: Home / Self Care | Payer: No Typology Code available for payment source | Attending: Obstetrics & Gynecology | Admitting: Obstetrics & Gynecology

## 2019-11-21 DIAGNOSIS — O99824 Streptococcus B carrier state complicating childbirth: Principal | ICD-10-CM | POA: Diagnosis present

## 2019-11-21 DIAGNOSIS — O26893 Other specified pregnancy related conditions, third trimester: Secondary | ICD-10-CM | POA: Diagnosis present

## 2019-11-21 DIAGNOSIS — Z20822 Contact with and (suspected) exposure to covid-19: Secondary | ICD-10-CM | POA: Diagnosis present

## 2019-11-21 DIAGNOSIS — Z3A39 39 weeks gestation of pregnancy: Secondary | ICD-10-CM | POA: Diagnosis not present

## 2019-11-21 DIAGNOSIS — Z3490 Encounter for supervision of normal pregnancy, unspecified, unspecified trimester: Secondary | ICD-10-CM

## 2019-11-21 LAB — CBC
HCT: 31.5 % — ABNORMAL LOW (ref 36.0–46.0)
Hemoglobin: 10.1 g/dL — ABNORMAL LOW (ref 12.0–15.0)
MCH: 31.1 pg (ref 26.0–34.0)
MCHC: 32.1 g/dL (ref 30.0–36.0)
MCV: 96.9 fL (ref 80.0–100.0)
Platelets: 175 10*3/uL (ref 150–400)
RBC: 3.25 MIL/uL — ABNORMAL LOW (ref 3.87–5.11)
RDW: 13.2 % (ref 11.5–15.5)
WBC: 5.8 10*3/uL (ref 4.0–10.5)
nRBC: 0 % (ref 0.0–0.2)

## 2019-11-21 LAB — RPR: RPR Ser Ql: NONREACTIVE

## 2019-11-21 LAB — TYPE AND SCREEN
ABO/RH(D): A POS
Antibody Screen: NEGATIVE

## 2019-11-21 MED ORDER — OXYCODONE-ACETAMINOPHEN 5-325 MG PO TABS: 1.0000 | ORAL_TABLET | ORAL | Status: DC | PRN

## 2019-11-21 MED ORDER — EPHEDRINE 5 MG/ML INJ: 10.0000 mg | INTRAVENOUS | Status: DC | PRN

## 2019-11-21 MED ORDER — ONDANSETRON HCL 4 MG/2ML IJ SOLN: 4.0000 mg | INTRAMUSCULAR | Status: DC | PRN

## 2019-11-21 MED ORDER — DIBUCAINE (PERIANAL) 1 % EX OINT: 1.0000 "application " | TOPICAL_OINTMENT | CUTANEOUS | Status: DC | PRN

## 2019-11-21 MED ORDER — LACTATED RINGERS IV SOLN: 500.0000 mL | Freq: Once | INTRAVENOUS | Status: DC

## 2019-11-21 MED ORDER — OXYTOCIN-SODIUM CHLORIDE 30-0.9 UT/500ML-% IV SOLN: 2.5000 [IU]/h | INTRAVENOUS | Status: DC

## 2019-11-21 MED ORDER — FENTANYL-BUPIVACAINE-NACL 0.5-0.125-0.9 MG/250ML-% EP SOLN
12.0000 mL/h | EPIDURAL | Status: DC | PRN
  Filled 2019-11-21: qty 250

## 2019-11-21 MED ORDER — SENNOSIDES-DOCUSATE SODIUM 8.6-50 MG PO TABS
2.0000 | ORAL_TABLET | ORAL | Status: DC
  Administered 2019-11-21: 2 via ORAL
  Filled 2019-11-21: qty 2

## 2019-11-21 MED ORDER — FENTANYL CITRATE (PF) 2500 MCG/50ML IJ SOLN
INTRAMUSCULAR | Status: DC | PRN
Start: 2019-11-21 — End: 2019-11-21
  Administered 2019-11-21: 12 mL/h via EPIDURAL

## 2019-11-21 MED ORDER — FENTANYL CITRATE (PF) 100 MCG/2ML IJ SOLN: 50.0000 ug | INTRAMUSCULAR | Status: DC | PRN

## 2019-11-21 MED ORDER — OXYCODONE-ACETAMINOPHEN 5-325 MG PO TABS: 2.0000 | ORAL_TABLET | ORAL | Status: DC | PRN

## 2019-11-21 MED ORDER — ZOLPIDEM TARTRATE 5 MG PO TABS: 5.0000 mg | ORAL_TABLET | Freq: Every evening | ORAL | Status: DC | PRN

## 2019-11-21 MED ORDER — PRENATAL MULTIVITAMIN CH
1.0000 | ORAL_TABLET | Freq: Every day | ORAL | Status: DC
  Administered 2019-11-22: 1 via ORAL
  Filled 2019-11-21: qty 1

## 2019-11-21 MED ORDER — IBUPROFEN 600 MG PO TABS
600.0000 mg | ORAL_TABLET | Freq: Four times a day (QID) | ORAL | Status: DC
  Administered 2019-11-21 – 2019-11-22 (×4): 600 mg via ORAL
  Filled 2019-11-21 (×4): qty 1

## 2019-11-21 MED ORDER — OXYTOCIN-SODIUM CHLORIDE 30-0.9 UT/500ML-% IV SOLN
1.0000 m[IU]/min | INTRAVENOUS | Status: DC
  Administered 2019-11-21: 2 m[IU]/min via INTRAVENOUS
  Filled 2019-11-21: qty 500

## 2019-11-21 MED ORDER — SODIUM CHLORIDE 0.9 % IV SOLN
5.0000 10*6.[IU] | Freq: Once | INTRAVENOUS | Status: AC
  Administered 2019-11-21: 5 10*6.[IU] via INTRAVENOUS
  Filled 2019-11-21: qty 5

## 2019-11-21 MED ORDER — TERBUTALINE SULFATE 1 MG/ML IJ SOLN: 0.2500 mg | Freq: Once | INTRAMUSCULAR | Status: DC | PRN

## 2019-11-21 MED ORDER — PHENYLEPHRINE 40 MCG/ML (10ML) SYRINGE FOR IV PUSH (FOR BLOOD PRESSURE SUPPORT): 80.0000 ug | PREFILLED_SYRINGE | INTRAVENOUS | Status: DC | PRN

## 2019-11-21 MED ORDER — ACETAMINOPHEN 325 MG PO TABS: 650.0000 mg | ORAL_TABLET | ORAL | Status: DC | PRN

## 2019-11-21 MED ORDER — COCONUT OIL OIL: 1.0000 "application " | TOPICAL_OIL | Status: DC | PRN

## 2019-11-21 MED ORDER — LIDOCAINE HCL (PF) 1 % IJ SOLN: 30.0000 mL | INTRAMUSCULAR | Status: DC | PRN

## 2019-11-21 MED ORDER — DIPHENHYDRAMINE HCL 50 MG/ML IJ SOLN: 12.5000 mg | INTRAMUSCULAR | Status: DC | PRN

## 2019-11-21 MED ORDER — LACTATED RINGERS IV SOLN: INTRAVENOUS | Status: DC

## 2019-11-21 MED ORDER — DIPHENHYDRAMINE HCL 25 MG PO CAPS: 25.0000 mg | ORAL_CAPSULE | Freq: Four times a day (QID) | ORAL | Status: DC | PRN

## 2019-11-21 MED ORDER — PENICILLIN G POT IN DEXTROSE 60000 UNIT/ML IV SOLN
3.0000 10*6.[IU] | INTRAVENOUS | Status: DC
  Administered 2019-11-21 (×2): 3 10*6.[IU] via INTRAVENOUS
  Filled 2019-11-21 (×2): qty 50

## 2019-11-21 MED ORDER — MISOPROSTOL 25 MCG QUARTER TABLET
25.0000 ug | ORAL_TABLET | ORAL | Status: DC | PRN
  Administered 2019-11-21 (×2): 25 ug via VAGINAL
  Filled 2019-11-21 (×2): qty 1

## 2019-11-21 MED ORDER — SIMETHICONE 80 MG PO CHEW: 80.0000 mg | CHEWABLE_TABLET | ORAL | Status: DC | PRN

## 2019-11-21 MED ORDER — LIDOCAINE HCL (PF) 1 % IJ SOLN
INTRAMUSCULAR | Status: DC | PRN
  Administered 2019-11-21: 10 mL via EPIDURAL
  Administered 2019-11-21: 2 mL via EPIDURAL

## 2019-11-21 MED ORDER — ONDANSETRON HCL 4 MG PO TABS: 4.0000 mg | ORAL_TABLET | ORAL | Status: DC | PRN

## 2019-11-21 MED ORDER — BENZOCAINE-MENTHOL 20-0.5 % EX AERO
1.0000 "application " | INHALATION_SPRAY | CUTANEOUS | Status: DC | PRN
  Administered 2019-11-21: 1 via TOPICAL
  Filled 2019-11-21: qty 56

## 2019-11-21 MED ORDER — TETANUS-DIPHTH-ACELL PERTUSSIS 5-2.5-18.5 LF-MCG/0.5 IM SUSP: 0.5000 mL | Freq: Once | INTRAMUSCULAR | Status: DC

## 2019-11-21 MED ORDER — WITCH HAZEL-GLYCERIN EX PADS: 1.0000 "application " | MEDICATED_PAD | CUTANEOUS | Status: DC | PRN

## 2019-11-21 MED ORDER — OXYTOCIN BOLUS FROM INFUSION
333.0000 mL | Freq: Once | INTRAVENOUS | Status: AC
  Administered 2019-11-21: 333 mL via INTRAVENOUS

## 2019-11-21 MED ORDER — ONDANSETRON HCL 4 MG/2ML IJ SOLN: 4.0000 mg | Freq: Four times a day (QID) | INTRAMUSCULAR | Status: DC | PRN

## 2019-11-21 MED ORDER — SOD CITRATE-CITRIC ACID 500-334 MG/5ML PO SOLN: 30.0000 mL | ORAL | Status: DC | PRN

## 2019-11-21 MED ORDER — LACTATED RINGERS IV SOLN: 500.0000 mL | INTRAVENOUS | Status: DC | PRN

## 2019-11-21 NOTE — Anesthesia Preprocedure Evaluation (Signed)
Anesthesia Evaluation  Patient identified by MRN, date of birth, ID band Patient awake    Reviewed: Allergy & Precautions, Patient's Chart, lab work & pertinent test results  History of Anesthesia Complications Negative for: history of anesthetic complications  Airway Mallampati: II  TM Distance: >3 FB Neck ROM: Full    Dental no notable dental hx.    Pulmonary neg pulmonary ROS,    Pulmonary exam normal breath sounds clear to auscultation       Cardiovascular negative cardio ROS Normal cardiovascular exam Rhythm:Regular Rate:Normal     Neuro/Psych  Headaches, PSYCHIATRIC DISORDERS Depression    GI/Hepatic negative GI ROS, Neg liver ROS,   Endo/Other  negative endocrine ROS  Renal/GU negative Renal ROS  negative genitourinary   Musculoskeletal Lumbar spondylolisthesis, L5 fx as a teenager- no treatment  Sicca syndrome- not on systemic therapy    Abdominal   Peds  Hematology  (+) Blood dyscrasia, anemia , hct 31.5, plt 175   Anesthesia Other Findings   Reproductive/Obstetrics (+) Pregnancy 1 prior epidural with vaginal delivery                             Anesthesia Physical Anesthesia Plan  ASA: II and emergent  Anesthesia Plan: Epidural   Post-op Pain Management:    Induction:   PONV Risk Score and Plan: 2  Airway Management Planned: Natural Airway  Additional Equipment: None  Intra-op Plan:   Post-operative Plan:   Informed Consent: I have reviewed the patients History and Physical, chart, labs and discussed the procedure including the risks, benefits and alternatives for the proposed anesthesia with the patient or authorized representative who has indicated his/her understanding and acceptance.       Plan Discussed with:   Anesthesia Plan Comments:         Anesthesia Quick Evaluation

## 2019-11-21 NOTE — Progress Notes (Signed)
CVX 2/50/-2, AROM clear FHT Cat I Add pitocin prn CLEA when desired  Mitchel Honour, DO

## 2019-11-21 NOTE — Progress Notes (Signed)
Samantha Norman is a 34 y.o. G3P1011 at [redacted]w[redacted]d by ultrasound admitted for induction of labor due to Elective at term.  Subjective: Comfortable with CLEA.  Objective: BP 128/85   Pulse 81   Temp (!) 97.5 F (36.4 C) (Oral)   Resp 16   Ht 5\' 8"  (1.727 m)   Wt 87.2 kg   LMP 02/18/2019 (Exact Date)   BMI 29.24 kg/m  No intake/output data recorded. No intake/output data recorded.  FHT:  FHR: 140 bpm, variability: moderate,  accelerations:  Present,  decelerations:  Absent UC:   regular, every 2 minutes SVE:   Dilation: 4 Effacement (%): 70 Station: -2 Exam by:: Lima, rn  Labs: Lab Results  Component Value Date   WBC 5.8 11/21/2019   HGB 10.1 (L) 11/21/2019   HCT 31.5 (L) 11/21/2019   MCV 96.9 11/21/2019   PLT 175 11/21/2019    Assessment / Plan: Induction of labor due to maternal request,  progressing well on pitocin  Labor: Progressing normally Preeclampsia:  n/a Fetal Wellbeing:  Category I Pain Control:  Epidural I/D:  n/a Anticipated MOD:  NSVD  11/23/2019 11/21/2019, 12:48 PM

## 2019-11-21 NOTE — H&P (Addendum)
Samantha Norman is a 34 y.o. female G3P1011 at 74w3dpresenting for elective IOL.  Patient received second dose of cytotec at 0500 and is beginning to feel mild CTX.  Active FM.  Antepartum course complicated by migraines; stable during pregnancy.  Her first delivery was complicated by retained products of conception which required D&C.  GBS positive.  OB History    Gravida  3   Para  1   Term  1   Preterm  0   AB  1   Living  1     SAB  1   TAB  0   Ectopic  0   Multiple  0   Live Births  1          Past Medical History:  Diagnosis Date  . Dizziness    "goes along w/my migraines"  . Family history of adverse reaction to anesthesia    "Mom is overweight, has sleep apnea; has breathing issues w/anesthesia"  . History of chicken pox   . History of depression   . 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 Lumbar vertebral fracture ([facility] 2004   "sports related; L5; no OR"  . Migraine headache    "monthly even w/my RX" (05/31/2015)  . Mononucleosis dx'd 05/2015  . Pneumonia ~ 2010  . Sicca syndrome ([facility] 10/04/2015   Sicca syndrome with polyarthralgia. Established with Dr HTrudie Reedrheum - glandular without systemic manifestations - continue topical treatment, no need for systemic therapy at this time. ANA neg, ESR 2, CRP 0.6, anti CCP 5 (10/2015)   . Spondylolysis of lumbosacral region 2004   chronic R L5  . Thyroid nodule 01/23/2016   Right lower thyroid gland 6 mm nodule by CT (05/2015)   Past Surgical History:  Procedure Laterality Date  . brain MRI  11/2013   WNL, no demyelinating disease  . DILATION AND CURETTAGE OF UTERUS N/A 09/27/2017   Procedure: DILATATION AND CURETTAGE with ultrasound guidance;  Surgeon: GDian Queen MD;  [facility];  Service: Gynecology;  Laterality: N/A;  . DILATION AND CURETTAGE OF UTERUS  2019  . evoked brainstem auditory response  2014   WNL ([facility])  . WISDOM TOOTH EXTRACTION  2008   Family History: family history includes Alzheimer's  disease (age of onset: 575 in her maternal uncle; CAD (age of onset: 867 in her maternal grandmother; Cancer in her paternal grandfather and paternal uncle; Cancer (age of onset: 52 in her mother; Diabetes in her paternal uncle; Hypertension in her father and mother; Stroke (age of onset: 695 in her paternal uncle; Stroke (age of onset: 661 in her father; Stroke (age of onset: 81 in her paternal grandmother. Social History:  reports that she has never smoked. She has never used smokeless tobacco. She reports that she does not drink alcohol and does not use drugs.     Maternal Diabetes: No Genetic Screening: Normal Maternal Ultrasounds/Referrals: Normal Fetal Ultrasounds or other Referrals:  None Maternal Substance Abuse:  No Significant Maternal Medications:  None Significant Maternal Lab Results:  Group B Strep positive Other Comments:  None  Review of Systems Maternal Medical History:  Contractions: Onset was 1-2 hours ago.   Frequency: irregular.   Perceived severity is mild.    Fetal activity: Perceived fetal activity is normal.   Last perceived fetal movement was within the past hour.    Prenatal complications: no prenatal complications Prenatal Complications - Diabetes: none.    Dilation: 1.5 Effacement (%): 50 Station: -2 Exam by::  Alvira Monday, RN Blood pressure 127/76, pulse 78, temperature 97.9 F (36.6 C), temperature source Oral, resp. rate 18, height '5\' 8"'  (1.727 m), weight 87.2 kg, last menstrual period 02/18/2019, unknown if currently breastfeeding. Maternal Exam:  Uterine Assessment: Contraction strength is mild.  Contraction frequency is irregular.   Abdomen: Patient reports no abdominal tenderness. Fundal height is c/w dates.   Estimated fetal weight is 7#12.       Fetal Exam Fetal Monitor Review: Baseline rate: 135.  Variability: moderate (6-25 bpm).   Pattern: accelerations present and no decelerations.    Fetal State Assessment: Category I - tracings  are normal.     Physical Exam Constitutional:      Appearance: Normal appearance.  HENT:     Head: Normocephalic.  Pulmonary:     Effort: Pulmonary effort is normal.  Abdominal:     Palpations: Abdomen is soft.  Musculoskeletal:        General: Normal range of motion.     Cervical back: Normal range of motion.  Skin:    General: Skin is warm and dry.  Neurological:     Mental Status: She is alert.  Psychiatric:        Mood and Affect: Mood normal.        Behavior: Behavior normal.     Prenatal labs: ABO, Rh: --/--/A POS (08/24 0041) Antibody: NEG (08/24 0041) Rubella:  Immune RPR:   NR HBsAg:   Negative HIV:   NR GBS:   Positive  Assessment/Plan: 33yo G3P1011 at 54w3dfor elective IOL -Recheck cervix at 0900 to hopefully AROM -Pitocin prn -PCN for GBS ppx -CLEA planned -Anticipate NSVD   MLinda Hedges8/24/2021, 8:00 AM

## 2019-11-21 NOTE — Anesthesia Procedure Notes (Signed)
Epidural Patient location during procedure: OB Start time: 11/21/2019 11:15 AM End time: 11/21/2019 11:25 AM  Staffing Anesthesiologist: Lannie Fields, DO Performed: anesthesiologist   Preanesthetic Checklist Completed: patient identified, IV checked, risks and benefits discussed, monitors and equipment checked, pre-op evaluation and timeout performed  Epidural Patient position: sitting Prep: DuraPrep and site prepped and draped Patient monitoring: continuous pulse ox, blood pressure, heart rate and cardiac monitor Approach: midline Location: L3-L4 Injection technique: LOR air  Needle:  Needle type: Tuohy  Needle gauge: 17 G Needle length: 9 cm Needle insertion depth: 6 cm Catheter type: closed end flexible Catheter size: 19 Gauge Catheter at skin depth: 11 cm Test dose: negative  Assessment Sensory level: T8 Events: blood not aspirated, injection not painful, no injection resistance, no paresthesia and negative IV test  Additional Notes Patient identified. Risks/Benefits/Options discussed with patient including but not limited to bleeding, infection, nerve damage, paralysis, failed block, incomplete pain control, headache, blood pressure changes, nausea, vomiting, reactions to medication both or allergic, itching and postpartum back pain. Confirmed with bedside nurse the patient's most recent platelet count. Confirmed with patient that they are not currently taking any anticoagulation, have any bleeding history or any family history of bleeding disorders. Patient expressed understanding and wished to proceed. All questions were answered. Sterile technique was used throughout the entire procedure. Please see nursing notes for vital signs. Test dose was given through epidural catheter and negative prior to continuing to dose epidural or start infusion. Warning signs of high block given to the patient including shortness of breath, tingling/numbness in hands, complete motor  block, or any concerning symptoms with instructions to call for help. Patient was given instructions on fall risk and not to get out of bed. All questions and concerns addressed with instructions to call with any issues or inadequate analgesia.  Reason for block:procedure for pain

## 2019-11-22 LAB — CBC
HCT: 30.9 % — ABNORMAL LOW (ref 36.0–46.0)
Hemoglobin: 10 g/dL — ABNORMAL LOW (ref 12.0–15.0)
MCH: 31.3 pg (ref 26.0–34.0)
MCHC: 32.4 g/dL (ref 30.0–36.0)
MCV: 96.6 fL (ref 80.0–100.0)
Platelets: 149 10*3/uL — ABNORMAL LOW (ref 150–400)
RBC: 3.2 MIL/uL — ABNORMAL LOW (ref 3.87–5.11)
RDW: 13.2 % (ref 11.5–15.5)
WBC: 7.4 10*3/uL (ref 4.0–10.5)
nRBC: 0 % (ref 0.0–0.2)

## 2019-11-22 MED ORDER — IBUPROFEN 600 MG PO TABS
600.0000 mg | ORAL_TABLET | Freq: Four times a day (QID) | ORAL | 0 refills | Status: DC | PRN
Start: 2019-11-22 — End: 2020-04-29

## 2019-11-22 MED ORDER — ACETAMINOPHEN 325 MG PO TABS
650.0000 mg | ORAL_TABLET | Freq: Four times a day (QID) | ORAL | 0 refills | Status: DC | PRN
Start: 2019-11-22 — End: 2020-04-29

## 2019-11-22 MED FILL — IBUPROFEN 600 MG TABLET: 600 | 15 days supply | Qty: 60 | Fill #0

## 2019-11-22 NOTE — Anesthesia Postprocedure Evaluation (Signed)
Anesthesia Post Note  Patient: Samantha Norman  Procedure(s) Performed: AN AD HOC LABOR EPIDURAL     Patient location during evaluation: Mother Baby Anesthesia Type: Epidural Level of consciousness: awake, awake and alert and oriented Pain management: pain level controlled Vital Signs Assessment: post-procedure vital signs reviewed and stable Respiratory status: spontaneous breathing, nonlabored ventilation and respiratory function stable Cardiovascular status: stable Postop Assessment: no headache, patient able to bend at knees, no apparent nausea or vomiting, no backache, adequate PO intake and able to ambulate Anesthetic complications: no   No complications documented.  Last Vitals:  Vitals:   11/22/19 0106 11/22/19 0511  BP: 114/76 111/78  Pulse: 70 72  Resp: 18 18  Temp: 36.5 C 36.7 C  SpO2: 98% 99%    Last Pain:  Vitals:   11/22/19 0511  TempSrc: Oral  PainSc: 2    Pain Goal:                   Deagan Sevin

## 2019-11-22 NOTE — Plan of Care (Signed)
Patient Appropriate for discharge  ?

## 2019-11-22 NOTE — Progress Notes (Signed)
[name] was referred for history of depression/anxiety. * Referral screened out by Clinical Social Worker because none of the following criteria appear to apply: ~ History of anxiety/depression during this pregnancy, or of post-partum depression following prior delivery. ~ Diagnosis of anxiety and/or depression within last 3 years. Per further chart review, it appears that [name] was diagnosed with depression/anxiety in 2015.  OR * [name]'s symptoms currently being treated with medication and/or therapy.   Please contact the Clinical Social Worker if needs arise, by [facility] request, or if [name] scores greater than 9/yes to question 10 on [place] Postpartum Depression Screen.  Samantha Norman Samantha Norman, MSW, LCSW [facility] [phone]

## 2019-11-22 NOTE — Discharge Summary (Signed)
Postpartum Discharge Summary  Date of Service updated 11/22/19     Patient Name: Samantha Norman DOB: 02-15-1986 MRN: [id number]  Date of admission: 11/21/2019 Delivery date:11/21/2019  Delivering provider: Linda Hedges  Date of discharge: 11/22/2019  Admitting diagnosis: Pregnancy [Z34.90] Intrauterine pregnancy: [redacted]w[redacted]d    Secondary diagnosis:  Active Problems:   Pregnancy  Additional problems:     Discharge diagnosis: Term Pregnancy Delivered                                              Post partum procedures: Augmentation: AROM, Pitocin and Cytotec Complications: None  Hospital course: Induction of Labor With Vaginal Delivery   34y.o. yo [id number] admitted to the hospital 11/21/2019 for induction of labor.  Indication for induction: Elective.  Patient had an uncomplicated labor course as follows: Membrane Rupture Time/Date: 9:03 AM ,11/21/2019   Delivery Method:Vaginal, Spontaneous  Episiotomy: None  Lacerations:  1st degree;Perineal  Details of delivery can be found in separate delivery note.  Patient had a routine postpartum course. Patient is discharged home 11/22/19.  Newborn Data: Birth date:11/21/2019  Birth time:2:17 PM  Gender:Female  Living status:Living  Apgars:9 ,9  Weight:4025 g   Magnesium Sulfate received: No BMZ received: No Rhophylac:No MMR:No T-DaP:Given prenatally Flu: No Transfusion:No  Physical exam  Vitals:   11/21/19 1725 11/21/19 2143 11/22/19 0106 11/22/19 0511  BP: 119/75 110/72 114/76 111/78  Pulse: 83 61 70 72  Resp: '18 18 18 18  ' Temp: 98 F (36.7 C) 97.6 F (36.4 C) 97.7 F (36.5 C) 98 F (36.7 C)  TempSrc: Oral Oral Oral Oral  SpO2: 100% 98% 98% 99%  Weight:      Height:       General: alert, cooperative and no distress Lochia: appropriate Uterine Fundus: firm Incision: Healing well with no significant drainage DVT Evaluation: No evidence of DVT seen on physical exam. Labs: Lab Results  Component Value  Date   WBC 7.4 11/22/2019   HGB 10.0 (L) 11/22/2019   HCT 30.9 (L) 11/22/2019   MCV 96.6 11/22/2019   PLT 149 (L) 11/22/2019   CMP Latest Ref Rng & Units 08/15/2019  Glucose 70 - 99 mg/dL 87  BUN 6 - 23 mg/dL 6  Creatinine 0.40 - 1.20 mg/dL 0.55  Sodium 135 - 145 mEq/L 135  Potassium 3.5 - 5.1 mEq/L 4.2  Chloride 96 - 112 mEq/L 107  CO2 19 - 32 mEq/L 24  Calcium 8.4 - 10.5 mg/dL 8.5  Total Protein 6.0 - 8.3 g/dL 6.2  Total Bilirubin 0.2 - 1.2 mg/dL 0.6  Alkaline Phos 39 - 117 U/L 29(L)  AST 0 - 37 U/L 13  ALT 0 - 35 U/L 11   Edinburgh Score: Edinburgh Postnatal Depression Scale Screening Tool 11/21/2019  I have been able to laugh and see the funny side of things. (No Data)  Some encounter information is confidential and restricted. Go to Review Flowsheets activity to see all data.      After visit meds:  Allergies as of 11/22/2019   No Known Allergies     Medication List    TAKE these medications   acetaminophen 325 MG tablet Commonly known as: Tylenol Take 2 tablets (650 mg total) by mouth every 6 (six) hours as needed (for pain scale < 4).   ibuprofen 600 MG  tablet Commonly known as: ADVIL Take 1 tablet (600 mg total) by mouth every 6 (six) hours as needed.   PRENATAL VITAMINS PO Take 1 tablet by mouth daily.        Discharge home in stable condition Infant Feeding: Breast Infant Disposition:home with mother Discharge instruction: per After Visit Summary and Postpartum booklet. Activity: Advance as tolerated. Pelvic rest for 6 weeks.  Diet: routine diet Anticipated Birth Control: Unsure Postpartum Appointment:6 weeks Additional Postpartum F/U:  Future Appointments: Future Appointments  Date Time Provider [place]  02/19/2020  8:20 AM [facility]   Follow up Visit:      11/22/2019 Allena Katz, MD

## 2019-12-04 ENCOUNTER — Telehealth: Payer: No Typology Code available for payment source | Admitting: Family

## 2019-12-04 DIAGNOSIS — N61 Mastitis without abscess: Secondary | ICD-10-CM | POA: Diagnosis not present

## 2019-12-04 MED ORDER — CEPHALEXIN 500 MG PO CAPS: 500.0000 mg | ORAL_CAPSULE | Freq: Four times a day (QID) | ORAL | 0 refills | Status: AC

## 2019-12-04 NOTE — Progress Notes (Signed)
E Visit for Cellulitis  We are sorry that you are not feeling well. Here is how we plan to help!  Based on what you shared with me it looks like you have cellulitis.  Cellulitis looks like areas of skin redness, swelling, and warmth; it develops as a result of bacteria entering under the skin. Little red spots and/or bleeding can be seen in skin, and tiny surface sacs containing fluid can occur. Fever can be present. Cellulitis is almost always on one side of a body, and the lower limbs are the most common site of involvement.   I have prescribed:  Keflex 500mg  take one by mouth four times a day for 7 days.  HOME CARE:   Take your medications as ordered and take all of them, even if the skin irritation appears to be healing.   GET HELP RIGHT AWAY IF:   Symptoms that don't begin to go away within 48 hours.  Severe redness persists or worsens  If the area turns color, spreads or swells.  If it blisters and opens, develops yellow-brown crust or bleeds.  You develop a fever or chills.  If the pain increases or becomes unbearable.   Are unable to keep fluids and food down.  MAKE SURE YOU    Understand these instructions.  Will watch your condition.  Will get help right away if you are not doing well or get worse.  Thank you for choosing an e-visit. Your e-visit answers were reviewed by a board certified advanced clinical practitioner to complete your personal care plan. Depending upon the condition, your plan could have included both over the counter or prescription medications. Please review your pharmacy choice. Make sure the pharmacy is open so you can pick up prescription now. If there is a problem, you may contact your provider through and have the prescription routed to another pharmacy. Your safety is important to [employer]. If you have drug allergies check your prescription carefully.  For the next 24 hours you can use MyChart to ask questions about today's  visit, request a non-urgent call back, or ask for a work or school excuse. You will get an email in the next two days asking about your experience. I hope that your e-visit has been valuable and will speed your recovery.  Approximately 5 minutes was spent documenting and reviewing patient's chart.

## 2019-12-27 ENCOUNTER — Other Ambulatory Visit (HOSPITAL_COMMUNITY): Payer: Self-pay | Admitting: Obstetrics and Gynecology

## 2019-12-27 MED FILL — BLISOVI 24 FE 1-20 MG-MCG(2: 1-20 | 84 days supply | Qty: 84 | Fill #0

## 2020-02-01 IMAGING — US US MFM OB FOLLOW-UP
1 series · 14 of 28 positions shown · non-contrast
Comparison: none

[Series 1: us mfm ob follow-up · 14 of 65 slices shown]
[im 3/65]
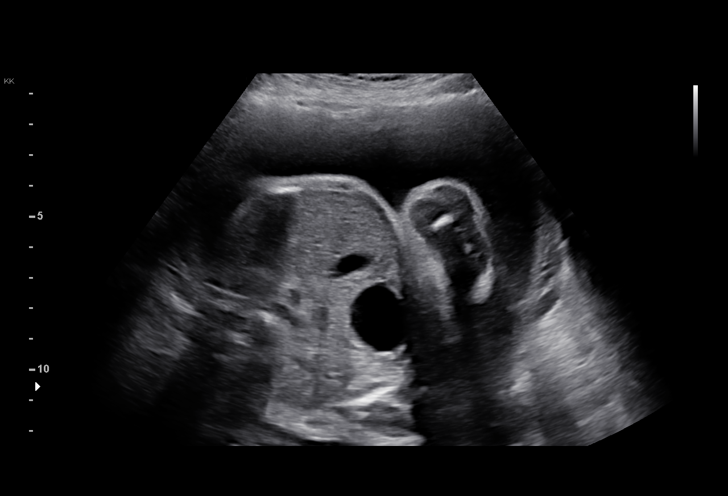
[im 8/65]
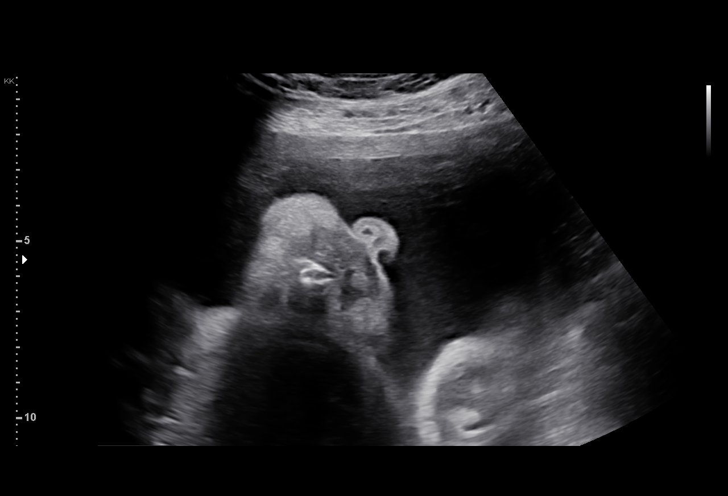
[im 12/65]
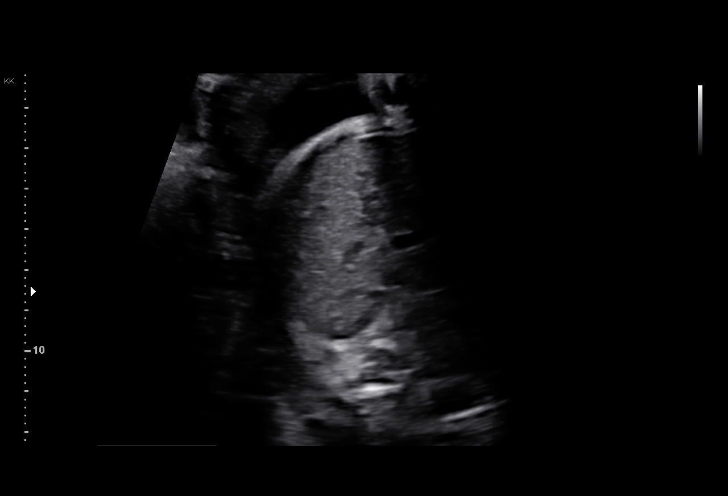
[im 17/65]
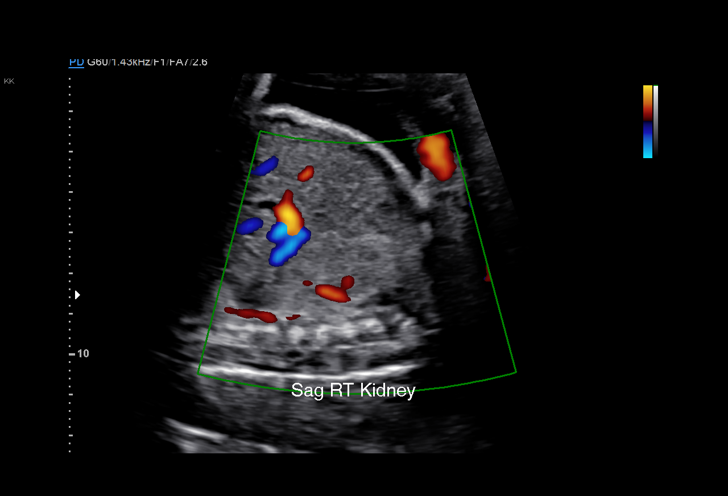
[im 22/65]
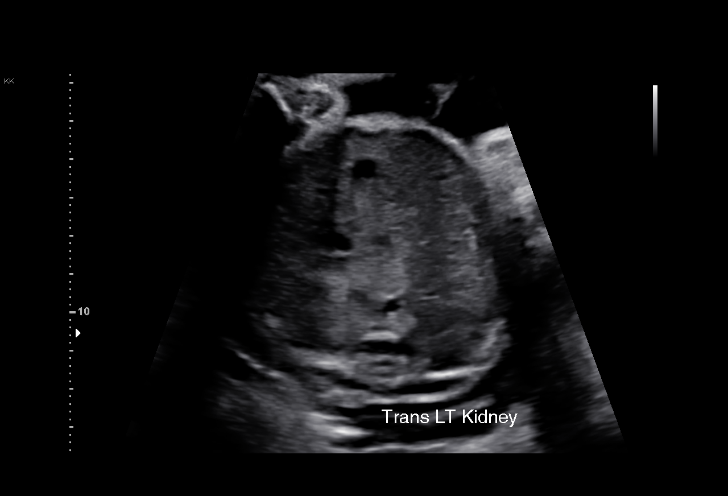
[im 27/65]
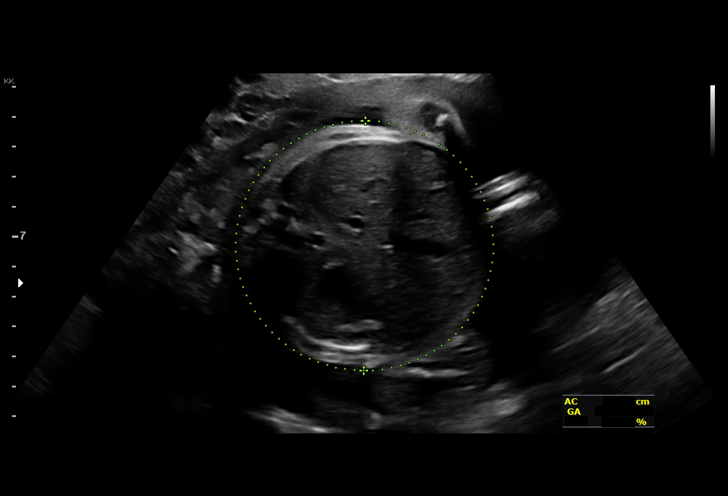
[im 31/65]
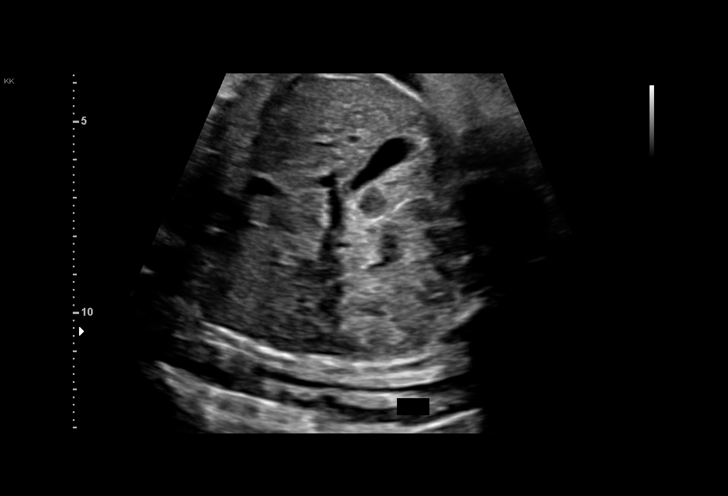
[im 36/65]
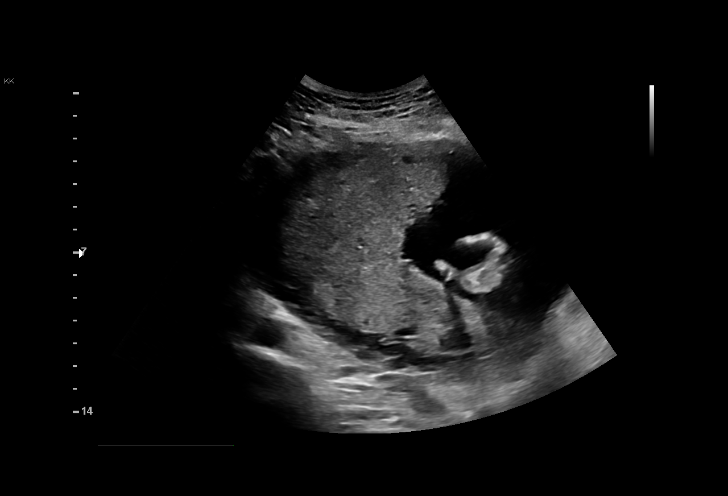
[im 41/65]
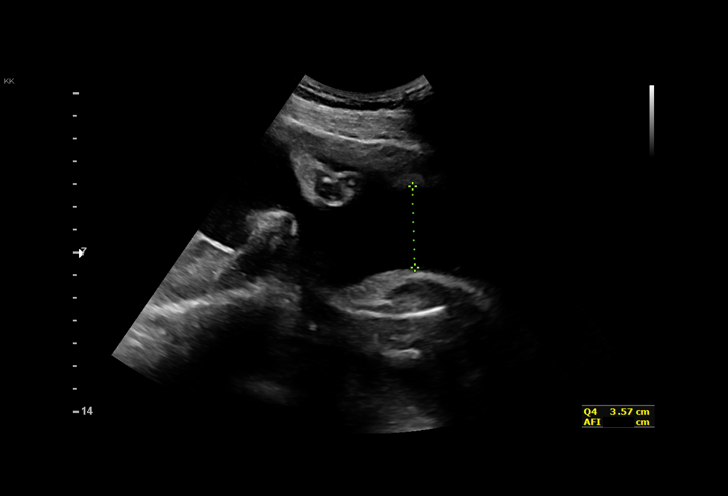
[im 46/65]
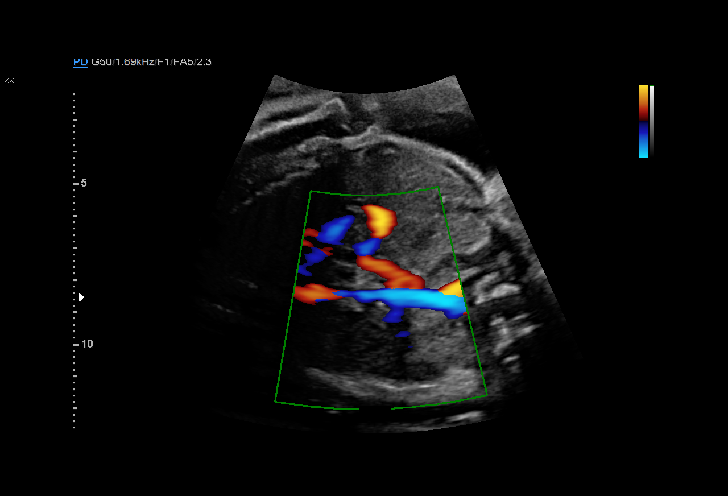
[im 50/65]
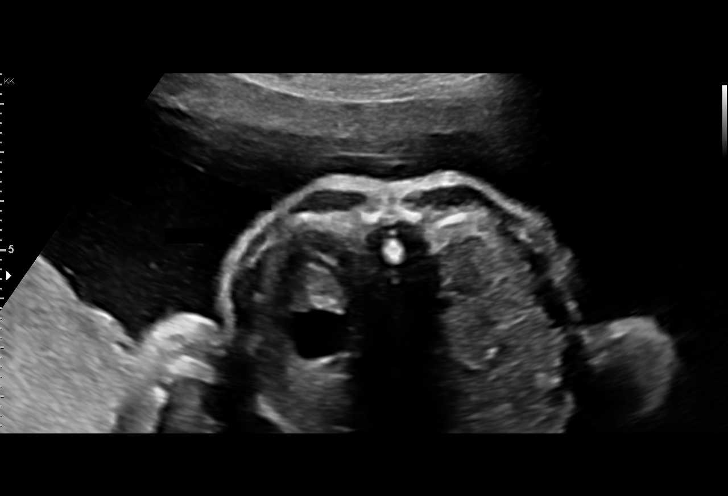
[im 55/65]
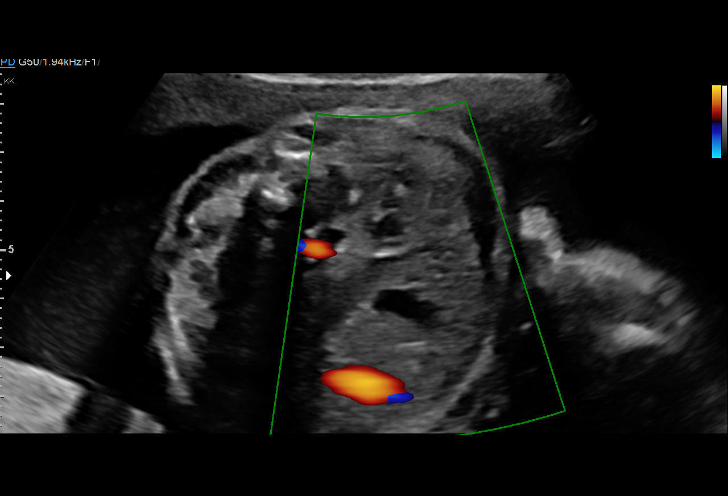
[im 60/65]
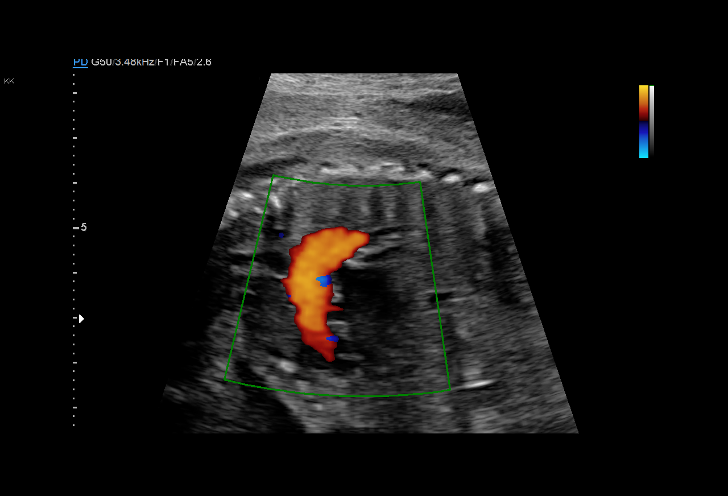
[im 65/65]
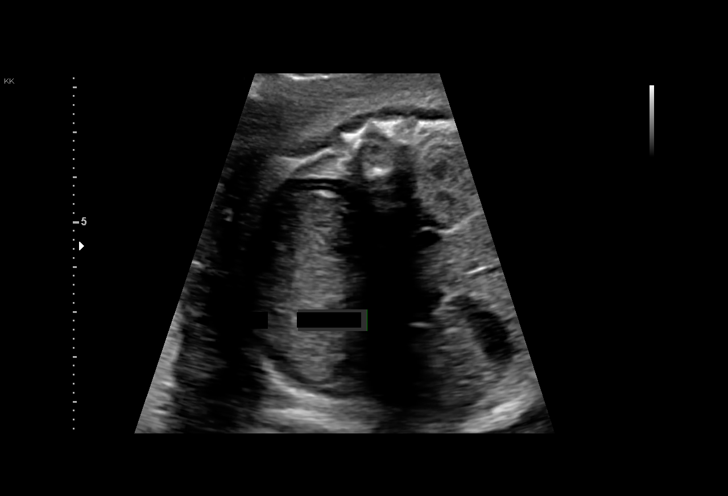

[14 of 28 positions shown; findings below may reference images not displayed]

Indications

29 weeks gestation of pregnancy
Encounter for other antenatal screening
follow-up
Fetal abnormality - other known or
suspected (i.e. choriod plexus cyst, EIF,
renal pyelectasis)
OB History

Blood Type:            Height:  5'8"   Weight (lb):  178       BMI:
Gravidity:    1
Fetal Evaluation

Num Of Fetuses:     1
Fetal Heart         157
Rate(bpm):
Cardiac Activity:   Observed
Presentation:       Breech
Placenta:           Posterior, above cervical os
P. Cord Insertion:  Previously Visualized

Amniotic Fluid
AFI FV:      Subjectively within normal limits

AFI Sum(cm)     %Tile       Largest Pocket(cm)
21.52           87

RUQ(cm)       RLQ(cm)       LUQ(cm)        LLQ(cm)
7.42
Biometry

BPD:      70.9  mm     G. Age:  28w 3d         22  %    CI:           73   %    70 - 86
FL/HC:      19.2   %    19.6 -
HC:      263.8  mm     G. Age:  28w 5d         13  %    HC/AC:      1.01        0.99 -
AC:       262   mm     G. Age:  30w 2d         81  %    FL/BPD:     71.4   %    71 - 87
FL:       50.6  mm     G. Age:  27w 1d          3  %    FL/AC:      19.3   %    20 - 24

Est. FW:    8888  gm    2 lb 15 oz      51  %
Gestational Age

LMP:           29w 0d        Date:  11/09/16                 EDD:   08/16/17
U/S Today:     28w 5d                                        EDD:   08/18/17
Best:          29w 0d     Det. By:  LMP  (11/09/16)          EDD:   08/16/17
Anatomy

Cranium:               Appears normal         Aortic Arch:            Appears normal
Cavum:                 Previously seen        Ductal Arch:            Appears normal
Ventricles:            Appears normal         Diaphragm:              Appears normal
Choroid Plexus:        Previously seen        Stomach:                Appears normal, left
sided
Cerebellum:            Previously seen        Abdomen:                Appears normal
Posterior Fossa:       Previously seen        Abdominal Wall:         Previously seen
Nuchal Fold:           Previously seen        Cord Vessels:           Previously seen
Face:                  Orbits and profile     Kidneys:                Appear normal
previously seen
Lips:                  Appears normal         Bladder:                Appears normal
Thoracic:              Appears normal         Spine:                  Previously seen
Heart:                 Previously seen        Upper Extremities:      Previously seen
RVOT:                  Previously seen        Lower Extremities:      Previously seen
LVOT:                  Previously seen

Other:  Heels and 5th digit previously visualized. Technically difficult due to
fetal position.
Cervix Uterus Adnexa

Cervix
Not visualized (advanced GA >68wks)
Impression

Singleton intrauterine pregnancy at 29+0 weeks with
suspected renal abnormality here for repeat evaluation
Interval review of the anatomy shows no sonographic
markers for aneuploidy or structural anomalies
All relevant fetal anatomy has been visualized
Amniotic fluid volume is normal
Estimated fetal weight shows growth in the 51st percentile
Recommendations

I am aware there has been some concern about the lower
pole of the left fetal kidney; I am unable to find any significant
abnormality except some decreased echolucency. This is
most likely not pathologic. Recommend postnatal evaluation
by the pediatrican. Follow-up ultrasounds as clinically
indicated.

## 2020-02-19 ENCOUNTER — Other Ambulatory Visit: Payer: Self-pay

## 2020-02-19 ENCOUNTER — Other Ambulatory Visit (INDEPENDENT_AMBULATORY_CARE_PROVIDER_SITE_OTHER): Payer: No Typology Code available for payment source

## 2020-02-19 DIAGNOSIS — E7800 Pure hypercholesterolemia, unspecified: Secondary | ICD-10-CM | POA: Diagnosis not present

## 2020-02-19 LAB — LIPID PANEL
Cholesterol: 202 mg/dL — ABNORMAL HIGH (ref 0–200)
HDL: 43.4 mg/dL (ref >39.00)
LDL Cholesterol: 128 mg/dL — ABNORMAL HIGH (ref 0–99)
NonHDL: 158.33
Total CHOL/HDL Ratio: 5
Triglycerides: 154 mg/dL — ABNORMAL HIGH (ref 0.0–149.0)
VLDL: 30.8 mg/dL (ref 0.0–40.0)

## 2020-03-26 MED FILL — BLISOVI 24 FE 1-20 MG-MCG(2: 1-20 | 84 days supply | Qty: 84 | Fill #1

## 2020-04-08 ENCOUNTER — Other Ambulatory Visit: Payer: No Typology Code available for payment source

## 2020-04-08 DIAGNOSIS — Z20822 Contact with and (suspected) exposure to covid-19: Secondary | ICD-10-CM

## 2020-04-11 LAB — NOVEL CORONAVIRUS, NAA: SARS-CoV-2, NAA: NOT DETECTED

## 2020-04-11 LAB — SARS-COV-2, NAA 2 DAY TAT

## 2020-04-29 ENCOUNTER — Other Ambulatory Visit: Payer: Self-pay

## 2020-04-29 ENCOUNTER — Ambulatory Visit (INDEPENDENT_AMBULATORY_CARE_PROVIDER_SITE_OTHER)
Admission: RE | Admit: 2020-04-29 | Discharge: 2020-04-29 | Disposition: A | Discharge status: Home / Self Care | Payer: No Typology Code available for payment source | Source: Ambulatory Visit | Attending: Family Medicine | Admitting: Family Medicine

## 2020-04-29 ENCOUNTER — Encounter: Payer: Self-pay | Admitting: Family Medicine

## 2020-04-29 ENCOUNTER — Ambulatory Visit (INDEPENDENT_AMBULATORY_CARE_PROVIDER_SITE_OTHER): Payer: No Typology Code available for payment source | Admitting: Family Medicine

## 2020-04-29 VITALS — BP 110/70 | HR 85 | Temp 97.9°F | Ht 68.0 in | Wt 157.4 lb

## 2020-04-29 DIAGNOSIS — S99922A Unspecified injury of left foot, initial encounter: Secondary | ICD-10-CM | POA: Insufficient documentation

## 2020-04-29 DIAGNOSIS — S99921A Unspecified injury of right foot, initial encounter: Secondary | ICD-10-CM

## 2020-04-29 NOTE — Patient Instructions (Signed)
Toe Fracture A toe fracture is a break in one of the toe bones (phalanges). A toe fracture may be:  A crack in the surface of the bone (stress fracture). This often occurs in athletes.  A break all the way through the bone (complete fracture). What are the causes? This condition may be caused by:  Direct impact, such as from dropping a heavy object on your toe.  Stubbing your toe.  Twisting or stretching your toe out of place.  Overuse or repetitive exercise. What increases the risk? You are more likely to develop this condition if you:  Play contact sports.  Have a condition that causes the bones to become thin and brittle (osteoporosis).  Have a low calcium level. What are the signs or symptoms? The main symptoms of this condition are swelling and pain in the toe. Other symptoms include:  Bruising.  Stiffness.  Numbness.  A change in the way the toe looks.  Broken bones that poke through the skin.  Blood beneath the toenail.   How is this diagnosed? This condition is diagnosed with a physical exam. You may also have X-rays. How is this treated? Treatment for this condition depends on the type of fracture and its severity. Treatment may include:  Taping the broken toe to a toe that is next to it (buddy taping). This is the most common treatment for fractures in which the bone has not moved out of place (non-displaced fracture).  Wearing a shoe that has a wide, rigid sole to protect the toe and to limit its movement.  Wearing a walking cast.  Having a procedure to move the toe back into place.  Surgery. This may be needed if the: ? Pieces of broken bone are out of place (displaced). ? Bone breaks through the skin.  Physical therapy. This is done to help regain movement and strength in the toe. You may need follow-up X-rays to make sure that the bone is healing well and staying in position. Follow these instructions at home: If you have a shoe:  Wear the  shoe as told by your health care provider. Remove it only as told by your health care provider.  Loosen the shoe if your toes tingle, become numb, or turn cold and blue.  Keep the shoe clean and dry. If you have a cast:  Do not put pressure on any part of the cast until it is fully hardened. This may take several hours.  Do not stick anything inside the cast to scratch your skin. Doing that increases your risk of infection.  Check the skin around the cast every day. Tell your health care provider about any concerns.  You may put lotion on dry skin around the edges of the cast. Do not put lotion on the skin underneath the cast.  Keep the cast clean and dry. Bathing  Do not take baths, swim, or use a hot tub until your health care provider approves. Ask your health care provider if you can take showers.  If the shoe or cast is not waterproof: ? Do not let it get wet. ? Cover it with a watertight covering when you take a bath or a shower. Activity  Do not use the injured foot to support your body weight until your health care provider says that you can. Use crutches as directed.  Ask your health care provider: ? What activities are safe for you during recovery. ? What activities you need to avoid.  Do physical therapy   exercises as directed. Driving  Do not drive or use heavy machinery while taking pain medicine.  Do not drive while wearing a cast on a foot that you use for driving. Managing pain, stiffness, and swelling  If directed, put ice on painful areas: ? Put ice in a plastic bag. ? Place a towel between your skin and the bag.  If you have a shoe, remove it as told by your health care provider.  If you have a cast, place a towel between your cast and the bag. ? Leave the ice on for 20 minutes, 2-3 times per day.  Raise (elevate) the injured area above the level of your heart while you are sitting or lying down.   General instructions  If your toe was treated with  buddy taping, follow your health care provider's instructions for changing the gauze and tape. Change it more often if: ? The gauze and tape get wet. If this happens, dry the space between the toes. ? The gauze and tape are too tight and cause your toe to become pale or numb.  If you were not given a protective shoe, wear sturdy, supportive shoes. Your shoes should not pinch your toes and should not fit tightly against your toes.  Do not use any products that contain nicotine or tobacco, such as cigarettes and e-cigarettes. These can delay bone healing. If you need help quitting, ask your health care provider.  Take over-the-counter and prescription medicines only as told by your health care provider.  Keep all follow-up visits as told by your health care provider. This is important. Contact a health care provider if you have:  Pain that gets worse or does not get better with medicine.  A fever.  A bad smell coming from your cast. Get help right away if you have:  Any of the following in your toes or your foot: ? Numbness that gets worse. ? Tingling. ? Coldness. ? Blue skin.  Redness or swelling that gets worse.  Pain that suddenly becomes severe. Summary  A toe fracture is a break in one of the toe bones (phalanges).  Treatment depends on how severe your fracture is and how the pieces of the broken bone line up with each other. Treatment may include buddy taping, wearing a shoe or a cast, or using crutches.  Ice and elevate your foot to help lessen the pain and swelling. This information is not intended to replace advice given to you by your health care provider. Make sure you discuss any questions you have with your health care provider. Document Revised: 06/29/2017 Document Reviewed: 04/27/2017 Elsevier Patient Education  2021 [employer]  

## 2020-04-29 NOTE — Progress Notes (Signed)
Patient ID: Samantha Norman, female    DOB: Nov 09, 1985, 35 y.o.   MRN: [id number]  This visit was conducted in person.  BP 110/70 (BP Location: Left Arm, Patient Position: Sitting, Cuff Size: Normal)   Pulse 85   Temp 97.9 F (36.6 C) (Temporal)   Ht 5\' 8"  (1.727 m)   Wt 157 lb 6 oz (71.4 kg)   LMP 04/25/2020   SpO2 98%   BMI 23.93 kg/m    CC: R toe injury  Subjective:   HPI: Samantha Norman is a 35 y.o. female presenting on 04/29/2020 for Toe Injury (Pt injured 3rd right toe.  Stubbed in on sofa and heard a "crack" sound.  Happened about 2 wks.  Still has pain in toe. )   DOI: 3-4 wks ago  Stubbed R 3rd toe on sofa, persistent pain since then. Initially swollen, now better. Then daughter stepped on it.  Treated conservatively - ice, ibuporfen.   LMP last Thurdsay     Relevant past medical, surgical, family and social history reviewed and updated as indicated. Interim medical history since our last visit reviewed. Allergies and medications reviewed and updated. Outpatient Medications Prior to Visit  Medication Sig Dispense Refill  . BLISOVI 24 FE 1-20 MG-MCG(24) tablet Take 1 tablet by mouth daily.    . Prenatal Multivit-Min-Fe-FA (PRENATAL VITAMINS PO) Take 1 tablet by mouth daily.     06-16-1991 acetaminophen (TYLENOL) 325 MG tablet Take 2 tablets (650 mg total) by mouth every 6 (six) hours as needed (for pain scale < 4). 60 tablet 0  . ibuprofen (ADVIL) 600 MG tablet Take 1 tablet (600 mg total) by mouth every 6 (six) hours as needed. 60 tablet 0   No facility-administered medications prior to visit.     Per HPI unless specifically indicated in ROS section below Review of Systems Objective:  BP 110/70 (BP Location: Left Arm, Patient Position: Sitting, Cuff Size: Normal)   Pulse 85   Temp 97.9 F (36.6 C) (Temporal)   Ht 5\' 8"  (1.727 m)   Wt 157 lb 6 oz (71.4 kg)   LMP 04/25/2020   SpO2 98%   BMI 23.93 kg/m   Wt Readings from Last 3 Encounters:  04/29/20 157 lb  6 oz (71.4 kg)  11/21/19 192 lb 4.8 oz (87.2 kg)  08/22/19 172 lb 4 oz (78.1 kg)      Physical Exam Vitals and nursing note reviewed.  Constitutional:      Appearance: Normal appearance. She is not ill-appearing.  Musculoskeletal:        General: Swelling and tenderness present.     Comments:  2+ DP bilaterally L foot WNL R foot - swollen 3rd toe, tender to palpation at proximal phalanx with minimal pain with axial loading, limited ROM in flexion at IP joints. No pain at base of 5th MT or significant pain with palpation of MT shafts  Neurological:     Mental Status: She is alert.       Assessment & Plan:  This visit occurred during the SARS-CoV-2 public health emergency.  Safety protocols were in place, including screening questions prior to the visit, additional usage of staff PPE, and extensive cleaning of exam room while observing appropriate contact time as indicated for disinfecting solutions.   Problem List Items Addressed This Visit    Injury of left toe, initial encounter - Primary    Check xrays for residual discomfort and swelling after initial inury 3-4 wks ago. Xray  with transverse fracture of proximal phalanx of 3rd toe. Will buddy tape and place in post op shoe for 2-3 wks. Rest, elevate foot in interim.  Supportive care reviewed.  Anticipate will heal well.  Update if concerns.           No orders of the defined types were placed in this encounter.  Orders Placed This Encounter  Procedures  . DG Foot Complete Right    Standing Status:   Future    Number of Occurrences:   1    Standing Expiration Date:   04/29/2021    Order Specific Question:   Reason for Exam (SYMPTOM  OR DIAGNOSIS REQUIRED)    Answer:   R 3rd toe injury    Order Specific Question:   Is patient pregnant?    Answer:   No    Order Specific Question:   Preferred imaging location?    Answer:   [facility]    Follow up plan: Return if symptoms worsen or fail to improve.  Eustaquio Boyden, MD

## 2020-04-29 NOTE — Assessment & Plan Note (Addendum)
Check xrays for residual discomfort and swelling after initial inury 3-4 wks ago. Xray with transverse fracture of proximal phalanx of 3rd toe. Will buddy tape and place in post op shoe for 2-3 wks. Rest, elevate foot in interim.  Supportive care reviewed.  Anticipate will heal well.  Update if concerns.

## 2020-06-18 MED FILL — BLISOVI 24 FE 1-20 MG-MCG(2: 1-20 | 84 days supply | Qty: 84 | Fill #2

## 2020-09-12 ENCOUNTER — Other Ambulatory Visit (HOSPITAL_COMMUNITY): Payer: Self-pay

## 2020-09-12 MED FILL — Norethindrone Ace-Ethinyl Estradiol-FE Tab 1 MG-20 MCG (24): ORAL | 84 days supply | Qty: 84 | Fill #0 | Status: AC

## 2020-12-03 ENCOUNTER — Other Ambulatory Visit (HOSPITAL_COMMUNITY): Payer: Self-pay

## 2020-12-03 MED FILL — Norethindrone Ace-Ethinyl Estradiol-FE Tab 1 MG-20 MCG (24): ORAL | 84 days supply | Qty: 84 | Fill #1 | Status: AC

## 2021-01-30 ENCOUNTER — Other Ambulatory Visit (HOSPITAL_COMMUNITY): Payer: Self-pay

## 2021-01-30 MED ORDER — BLISOVI 24 FE 1-20 MG-MCG(24) PO TABS
1.0000 | ORAL_TABLET | Freq: Every day | ORAL | 4 refills | Status: DC
  Filled 2021-01-30 – 2021-02-27 (×2): qty 84, 84d supply, fill #0
  Filled 2021-05-25: qty 84, 84d supply, fill #1
  Filled 2021-08-15: qty 84, 84d supply, fill #2
  Filled 2021-11-01: qty 84, 84d supply, fill #3
  Filled 2022-01-26: qty 84, 84d supply, fill #4

## 2021-02-03 ENCOUNTER — Other Ambulatory Visit (HOSPITAL_COMMUNITY): Payer: Self-pay

## 2021-02-03 MED ORDER — CARESTART COVID-19 HOME TEST VI KIT
PACK | 0 refills | Status: DC
  Filled 2021-02-03: qty 4, 4d supply, fill #0

## 2021-02-11 ENCOUNTER — Telehealth: Payer: No Typology Code available for payment source | Admitting: Physician Assistant

## 2021-02-11 ENCOUNTER — Other Ambulatory Visit (HOSPITAL_COMMUNITY): Payer: Self-pay

## 2021-02-11 DIAGNOSIS — J019 Acute sinusitis, unspecified: Secondary | ICD-10-CM | POA: Diagnosis not present

## 2021-02-11 MED ORDER — AMOXICILLIN-POT CLAVULANATE 875-125 MG PO TABS
1.0000 | ORAL_TABLET | Freq: Two times a day (BID) | ORAL | 0 refills | Status: AC
  Filled 2021-02-11: qty 14, 7d supply, fill #0

## 2021-02-11 NOTE — Progress Notes (Signed)

## 2021-02-28 ENCOUNTER — Other Ambulatory Visit (HOSPITAL_COMMUNITY): Payer: Self-pay

## 2021-05-26 ENCOUNTER — Other Ambulatory Visit (HOSPITAL_COMMUNITY): Payer: Self-pay

## 2021-08-15 ENCOUNTER — Other Ambulatory Visit (HOSPITAL_COMMUNITY): Payer: Self-pay

## 2021-10-29 ENCOUNTER — Encounter: Payer: Self-pay | Admitting: Family Medicine

## 2021-10-29 ENCOUNTER — Ambulatory Visit (INDEPENDENT_AMBULATORY_CARE_PROVIDER_SITE_OTHER): Payer: No Typology Code available for payment source | Admitting: Family Medicine

## 2021-10-29 ENCOUNTER — Other Ambulatory Visit (HOSPITAL_COMMUNITY): Payer: Self-pay

## 2021-10-29 VITALS — BP 116/82 | HR 86 | Temp 97.6°F | Ht 68.0 in | Wt 149.1 lb

## 2021-10-29 DIAGNOSIS — B360 Pityriasis versicolor: Secondary | ICD-10-CM | POA: Insufficient documentation

## 2021-10-29 DIAGNOSIS — R42 Dizziness and giddiness: Secondary | ICD-10-CM

## 2021-10-29 DIAGNOSIS — R21 Rash and other nonspecific skin eruption: Secondary | ICD-10-CM | POA: Diagnosis not present

## 2021-10-29 DIAGNOSIS — E611 Iron deficiency: Secondary | ICD-10-CM | POA: Diagnosis not present

## 2021-10-29 LAB — POCT SKIN KOH: Skin KOH, POC: POSITIVE — AB

## 2021-10-29 MED ORDER — FLUCONAZOLE 150 MG PO TABS
300.0000 mg | ORAL_TABLET | ORAL | 0 refills | Status: DC
  Filled 2021-10-29: qty 4, 14d supply, fill #0

## 2021-10-29 NOTE — Assessment & Plan Note (Signed)
Update CBC, ferritin.

## 2021-10-29 NOTE — Progress Notes (Signed)
  Patient ID: Samantha Norman, female    DOB: 09/03/1985, 35 y.o.   MRN: [id number]  This visit was conducted in person.  BP 116/82   Pulse 86   Temp 97.6 F (36.4 C) (Temporal)   Ht 5' 8" (1.727 m)   Wt 149 lb 2 oz (67.6 kg)   SpO2 98%   BMI 22.67 kg/m    CC: rash on back  Subjective:   HPI: Samantha Norman is a 35 y.o. female presenting on 10/29/2021 for Rash (C/o rash on back. Noticed about 1 mo ago. Area is itchy.  Tried OTC hydrocortisone. )   1 mo h/o rash noted to back.  Started after going blueberry picking - ?bug bites at that time. Developed large whelps to back, that since resolved. Back has remained itchy since then. She's also felt dizzy since rash developed. Nausea with vomiting x1 last week. Occasional headaches - she's been taking more ibuprofen than normal. Last week she went to beach with family who noted new rash to back. She does remember she had a tick bite below L wrist watch band earlier this year, unsure how long it was attached.   No new lotions, detergents, soaps or shampoos. No new foods, no new medicines. No new vitamins/supplements.  No h/o blueberry allergy.  No h/o red meat reaction/allergy.   No fevers/chills, abd pain, new joint pains.   H/o eczema as well as dry skin rash to ankle - chronic.  No sick contacts at home but BIL did have GI upset at same time as patient.   No regular periods since on birth control.      Relevant past medical, surgical, family and social history reviewed and updated as indicated. Interim medical history since our last visit reviewed. Allergies and medications reviewed and updated. Outpatient Medications Prior to Visit  Medication Sig Dispense Refill   Norethindrone Acetate-Ethinyl Estrad-FE (BLISOVI 24 FE) 1-20 MG-MCG(24) tablet Take 1 tablet by mouth daily. 84 tablet 4   BLISOVI 24 FE 1-20 MG-MCG(24) tablet Take 1 tablet by mouth daily.     COVID-19 At Home Antigen Test (CARESTART COVID-19 HOME TEST) KIT Use  as directed 4 each 0   Prenatal Multivit-Min-Fe-FA (PRENATAL VITAMINS PO) Take 1 tablet by mouth daily.      No facility-administered medications prior to visit.     Per HPI unless specifically indicated in ROS section below Review of Systems  Objective:  BP 116/82   Pulse 86   Temp 97.6 F (36.4 C) (Temporal)   Ht 5' 8" (1.727 m)   Wt 149 lb 2 oz (67.6 kg)   SpO2 98%   BMI 22.67 kg/m   Wt Readings from Last 3 Encounters:  10/29/21 149 lb 2 oz (67.6 kg)  04/29/20 157 lb 6 oz (71.4 kg)  11/21/19 192 lb 4.8 oz (87.2 kg)      Physical Exam Vitals and nursing note reviewed.  Constitutional:      Appearance: Normal appearance. She is not ill-appearing.  HENT:     Mouth/Throat:     Mouth: Mucous membranes are moist.     Pharynx: Oropharynx is clear. No oropharyngeal exudate or posterior oropharyngeal erythema.  Eyes:     Extraocular Movements: Extraocular movements intact.     Pupils: Pupils are equal, round, and reactive to light.  Neck:     Thyroid: No thyroid mass or thyromegaly.  Cardiovascular:     Rate and Rhythm: Normal rate and regular rhythm.       Pulses: Normal pulses.     Heart sounds: Normal heart sounds. No murmur heard. Pulmonary:     Effort: Pulmonary effort is normal. No respiratory distress.     Breath sounds: Normal breath sounds. No wheezing, rhonchi or rales.  Musculoskeletal:     Right lower leg: No edema.     Left lower leg: No edema.  Skin:    General: Skin is warm and dry.     Findings: Rash present.          Comments: Scaly circular patches on mid to lower back   Neurological:     General: No focal deficit present.     Mental Status: She is alert.     Cranial Nerves: Cranial nerves 2-12 are intact.     Coordination: Coordination is intact. Romberg sign negative. Coordination normal.     Gait: Gait is intact. Gait normal.     Comments:  CN 2-12 intact FTN intact EOMI No pronator drift  Psychiatric:        Mood and Affect: Mood normal.         Behavior: Behavior normal.       Results for orders placed or performed in visit on 10/29/21  POCT Skin KOH  Result Value Ref Range   Skin KOH, POC Positive (A) Negative    Assessment & Plan:   Problem List Items Addressed This Visit     Iron deficiency    Update CBC, ferritin.       Skin rash - Primary    Exam suspicious for fungal infection ?tinea versicolor.  KOH positive  Treat with diflucan 328m weekly x 2 wks.  Update if ongoing rash after treatment.       Relevant Orders   Comprehensive metabolic panel   CBC with Differential/Platelet   TSH   B. burgdorfi antibodies by WB   POCT Skin KOH (Completed)   Dizziness    Check labs for reversible causes.  With tick bite and rash and other symptoms, check for lyme disease.  Anticipate possible component of IDA - update CBC and ferritin. She doesn't regularly eat red meat.       Relevant Orders   Comprehensive metabolic panel   CBC with Differential/Platelet   TSH   B. burgdorfi antibodies by WB   Ferritin   Vitamin B12     Meds ordered this encounter  Medications   fluconazole (DIFLUCAN) 150 MG tablet    Sig: Take 2 tablets (300 mg total) by mouth once a week.    Dispense:  4 tablet    Refill:  0   Orders Placed This Encounter  Procedures   Comprehensive metabolic panel   CBC with Differential/Platelet   TSH   B. burgdorfi antibodies by WB   Ferritin   Vitamin B12   POCT Skin KOH    Patient Instructions  Labs today  Skin KOH test positive for fungal infection. Treat with antifungal sent to pharmacy.  Let [place] if rash not improved with treatment.   Follow up plan: No follow-ups on file.  JRia Bush MD

## 2021-10-29 NOTE — Patient Instructions (Addendum)
Labs today  Skin KOH test positive for fungal infection. Treat with antifungal sent to pharmacy.  Let us know if rash not improved with treatment.

## 2021-10-29 NOTE — Assessment & Plan Note (Signed)
Check labs for reversible causes.  With tick bite and rash and other symptoms, check for lyme disease.  Anticipate possible component of IDA - update CBC and ferritin. She doesn't regularly eat red meat.

## 2021-10-29 NOTE — Assessment & Plan Note (Signed)
Exam suspicious for fungal infection ?tinea versicolor.  KOH positive  Treat with diflucan 300mg  weekly x 2 wks.  Update if ongoing rash after treatment.

## 2021-10-30 ENCOUNTER — Other Ambulatory Visit: Payer: Self-pay | Admitting: Family Medicine

## 2021-10-30 LAB — TSH: TSH: 2.45 u[IU]/mL (ref 0.35–5.50)

## 2021-10-30 LAB — COMPREHENSIVE METABOLIC PANEL
ALT: 15 U/L (ref 0–35)
AST: 15 U/L (ref 0–37)
Albumin: 4.6 g/dL (ref 3.5–5.2)
Alkaline Phosphatase: 28 U/L — ABNORMAL LOW (ref 39–117)
BUN: 13 mg/dL (ref 6–23)
CO2: 27 mEq/L (ref 19–32)
Calcium: 9.6 mg/dL (ref 8.4–10.5)
Chloride: 105 mEq/L (ref 96–112)
Creatinine, Ser: 1.06 mg/dL (ref 0.40–1.20)
GFR: 68.34 mL/min (ref >60.00)
Glucose, Bld: 87 mg/dL (ref 70–99)
Potassium: 4.5 mEq/L (ref 3.5–5.1)
Sodium: 140 mEq/L (ref 135–145)
Total Bilirubin: 1.1 mg/dL (ref 0.2–1.2)
Total Protein: 7.4 g/dL (ref 6.0–8.3)

## 2021-10-30 LAB — CBC WITH DIFFERENTIAL/PLATELET
Basophils Absolute: 0 10*3/uL (ref 0.0–0.1)
Basophils Relative: 0.7 % (ref 0.0–3.0)
Eosinophils Absolute: 0.1 10*3/uL (ref 0.0–0.7)
Eosinophils Relative: 1.6 % (ref 0.0–5.0)
HCT: 41.7 % (ref 36.0–46.0)
Hemoglobin: 13.9 g/dL (ref 12.0–15.0)
Lymphocytes Relative: 41.6 % (ref 12.0–46.0)
Lymphs Abs: 2.2 10*3/uL (ref 0.7–4.0)
MCHC: 33.4 g/dL (ref 30.0–36.0)
MCV: 92.4 fl (ref 78.0–100.0)
Monocytes Absolute: 0.5 10*3/uL (ref 0.1–1.0)
Monocytes Relative: 9.2 % (ref 3.0–12.0)
Neutro Abs: 2.5 10*3/uL (ref 1.4–7.7)
Neutrophils Relative %: 46.9 % (ref 43.0–77.0)
Platelets: 206 10*3/uL (ref 150.0–400.0)
RBC: 4.51 Mil/uL (ref 3.87–5.11)
RDW: 13.4 % (ref 11.5–15.5)
WBC: 5.3 10*3/uL (ref 4.0–10.5)

## 2021-10-30 LAB — VITAMIN B12: Vitamin B-12: 208 pg/mL — ABNORMAL LOW (ref 211–911)

## 2021-10-30 LAB — FERRITIN: Ferritin: 5.9 ng/mL — ABNORMAL LOW (ref 10.0–291.0)

## 2021-10-30 MED ORDER — IRON (FERROUS SULFATE) 325 (65 FE) MG PO TABS: 325.0000 mg | ORAL_TABLET | ORAL | Status: DC

## 2021-10-30 MED ORDER — B-12 1000 MCG SL SUBL: 1.0000 | SUBLINGUAL_TABLET | Freq: Every day | SUBLINGUAL | Status: AC

## 2021-10-31 LAB — B. BURGDORFI ANTIBODIES BY WB

## 2021-11-03 ENCOUNTER — Other Ambulatory Visit (HOSPITAL_COMMUNITY): Payer: Self-pay

## 2021-12-10 ENCOUNTER — Encounter: Payer: Self-pay | Admitting: Family Medicine

## 2022-01-20 ENCOUNTER — Encounter: Payer: Self-pay | Admitting: *Deleted

## 2022-01-20 ENCOUNTER — Encounter: Payer: Self-pay | Admitting: Family Medicine

## 2022-01-20 ENCOUNTER — Ambulatory Visit (INDEPENDENT_AMBULATORY_CARE_PROVIDER_SITE_OTHER): Payer: No Typology Code available for payment source | Admitting: Family Medicine

## 2022-01-20 VITALS — BP 120/80 | HR 80 | Temp 97.8°F | Ht 68.0 in | Wt 150.2 lb

## 2022-01-20 DIAGNOSIS — E538 Deficiency of other specified B group vitamins: Secondary | ICD-10-CM | POA: Diagnosis not present

## 2022-01-20 DIAGNOSIS — R109 Unspecified abdominal pain: Secondary | ICD-10-CM

## 2022-01-20 DIAGNOSIS — K9041 Non-celiac gluten sensitivity: Secondary | ICD-10-CM | POA: Diagnosis not present

## 2022-01-20 DIAGNOSIS — E611 Iron deficiency: Secondary | ICD-10-CM | POA: Diagnosis not present

## 2022-01-20 DIAGNOSIS — Z1283 Encounter for screening for malignant neoplasm of skin: Secondary | ICD-10-CM

## 2022-01-20 DIAGNOSIS — R197 Diarrhea, unspecified: Secondary | ICD-10-CM

## 2022-01-20 LAB — IBC PANEL
Iron: 218 ug/dL — ABNORMAL HIGH (ref 42–145)
Saturation Ratios: 57.9 % — ABNORMAL HIGH (ref 20.0–50.0)
TIBC: 376.6 ug/dL (ref 250.0–450.0)
Transferrin: 269 mg/dL (ref 212.0–360.0)

## 2022-01-20 LAB — FERRITIN: Ferritin: 10 ng/mL (ref 10.0–291.0)

## 2022-01-20 LAB — VITAMIN B12: Vitamin B-12: 317 pg/mL (ref 211–911)

## 2022-01-20 MED ORDER — PRENATAL VITAMIN PLUS LOW IRON 27-1 MG PO TABS: 1.0000 | ORAL_TABLET | Freq: Every day | ORAL | Status: AC

## 2022-01-20 MED ORDER — CYANOCOBALAMIN 1000 MCG/ML IJ SOLN
1000.0000 ug | Freq: Once | INTRAMUSCULAR | Status: AC
  Administered 2022-01-20: 1000 ug via INTRAMUSCULAR

## 2022-01-20 NOTE — Assessment & Plan Note (Signed)
Dizziness is better with B12 replacement. Update b12 levels today then will treat with B12 shot. Continue oral replacement. Check IF Ab.

## 2022-01-20 NOTE — Assessment & Plan Note (Addendum)
Chronic present for years, does better when pregnant.  Previously followed low gluten diet, not currently. Notes symptoms have been worse for the past 1.5 years after latest pregnancy (loose stools, GI upset, gassiness). Now with noted iron and b12 deficiencies, reasonable to retest. Will update celiac disease panel. Discussed possible GI referral even if negative testing.  Will recommend follow gluten free diet. celiac disease handout provided.

## 2022-01-20 NOTE — Progress Notes (Signed)
Patient ID: Samantha Norman, female    DOB: 1985-05-22, 36 y.o.   MRN: [id number]  This visit was conducted in person.  BP 120/80   Pulse 80   Temp 97.8 F (36.6 C) (Temporal)   Ht 5\' 8"  (1.727 m)   Wt 150 lb 4 oz (68.2 kg)   SpO2 99%   BMI 22.85 kg/m    CC: GI issues Subjective:   HPI: Samantha Norman is a 36 y.o. female presenting on 01/20/2022 for GI Problem (C/o all over abd pain, nausea and diarrhea. Denies vomiting.  Sxs started yrs ago, worsening. )   Chronic GI issues including both watery and loose stools diarrhea for years, again present for the past 1.5 yrs. Worse with gluten. Occasional abdominal pain thought gas related.  Always seems to improve when pregnant.  No fevers/chills, nausea/vomiting, urinary symptoms. Occ blood with wiping, no blood in stool or commode. No GERD symptoms.  H/o eczema, notes occasional rash. Requests derm referral.   Seen 2 months ago with rash to back most consistent with tinea versicolor, positive KOH, treated with diflucan 300mg  weekly x 2 wks with complete resolution.   Labwork at that time negative for lyme disease with normal CMP, TSH, CBC. Ferritin markedly low at 6 without anemia, b12 low at 208.  She started oral iron 325mg  MWF as well as sublingual B12 1055mcg daily. Unable to tolerate oral iron due to bloating / GI upset. She does continue prenatal vitamin.  Dizziness is better with b12 replacement.   H/o non-celiac gluten sensitivity. Previously anti-TTG IgA negative (2018).  She states she tested high risk for celiac disease gene with 23-and-me genetic test.      Relevant past medical, surgical, family and social history reviewed and updated as indicated. Interim medical history since our last visit reviewed. Allergies and medications reviewed and updated. Outpatient Medications Prior to Visit  Medication Sig Dispense Refill   Cyanocobalamin (B-12) 1000 MCG SUBL Place 1 tablet under the tongue daily.      Norethindrone Acetate-Ethinyl Estrad-FE (BLISOVI 24 FE) 1-20 MG-MCG(24) tablet Take 1 tablet by mouth daily. 84 tablet 4   Iron, Ferrous Sulfate, 325 (65 Fe) MG TABS Take 325 mg by mouth every Monday, Wednesday, and Friday.     fluconazole (DIFLUCAN) 150 MG tablet Take 2 tablets (300 mg total) by mouth once a week. 4 tablet 0   No facility-administered medications prior to visit.     Per HPI unless specifically indicated in ROS section below Review of Systems  Objective:  BP 120/80   Pulse 80   Temp 97.8 F (36.6 C) (Temporal)   Ht 5\' 8"  (1.727 m)   Wt 150 lb 4 oz (68.2 kg)   SpO2 99%   BMI 22.85 kg/m   Wt Readings from Last 3 Encounters:  01/20/22 150 lb 4 oz (68.2 kg)  10/29/21 149 lb 2 oz (67.6 kg)  04/29/20 157 lb 6 oz (71.4 kg)      Physical Exam Vitals and nursing note reviewed.  Constitutional:      Appearance: Normal appearance. She is not ill-appearing.  Neck:     Thyroid: No thyroid mass, thyromegaly or thyroid tenderness.  Cardiovascular:     Rate and Rhythm: Normal rate and regular rhythm.     Pulses: Normal pulses.     Heart sounds: Normal heart sounds. No murmur heard. Pulmonary:     Effort: Pulmonary effort is normal. No respiratory distress.     Breath  sounds: Normal breath sounds. No wheezing, rhonchi or rales.  Abdominal:     General: Abdomen is flat. Bowel sounds are increased. There is no distension.     Palpations: Abdomen is soft. There is no hepatomegaly, splenomegaly or mass.     Tenderness: There is no abdominal tenderness. There is no guarding or rebound. Negative signs include Murphy's sign.     Hernia: No hernia is present.  Musculoskeletal:     Right lower leg: No edema.     Left lower leg: No edema.  Skin:    General: Skin is warm and dry.     Findings: No rash.  Neurological:     Mental Status: She is alert.  Psychiatric:        Mood and Affect: Mood normal.        Behavior: Behavior normal.       Results for orders placed or  performed in visit on 10/29/21  Comprehensive metabolic panel  Result Value Ref Range   Sodium 140 135 - 145 mEq/L   Potassium 4.5 3.5 - 5.1 mEq/L   Chloride 105 96 - 112 mEq/L   CO2 27 19 - 32 mEq/L   Glucose, Bld 87 70 - 99 mg/dL   BUN 13 6 - 23 mg/dL   Creatinine, Ser 3.76 0.40 - 1.20 mg/dL   Total Bilirubin 1.1 0.2 - 1.2 mg/dL   Alkaline Phosphatase 28 (L) 39 - 117 U/L   AST 15 0 - 37 U/L   ALT 15 0 - 35 U/L   Total Protein 7.4 6.0 - 8.3 g/dL   Albumin 4.6 3.5 - 5.2 g/dL   GFR 28.31 >51.76 mL/min   Calcium 9.6 8.4 - 10.5 mg/dL  CBC with Differential/Platelet  Result Value Ref Range   WBC 5.3 4.0 - 10.5 K/uL   RBC 4.51 3.87 - 5.11 Mil/uL   Hemoglobin 13.9 12.0 - 15.0 g/dL   HCT 16.0 73.7 - 10.6 %   MCV 92.4 78.0 - 100.0 fl   MCHC 33.4 30.0 - 36.0 g/dL   RDW 26.9 48.5 - 46.2 %   Platelets 206.0 150.0 - 400.0 K/uL   Neutrophils Relative % 46.9 43.0 - 77.0 %   Lymphocytes Relative 41.6 12.0 - 46.0 %   Monocytes Relative 9.2 3.0 - 12.0 %   Eosinophils Relative 1.6 0.0 - 5.0 %   Basophils Relative 0.7 0.0 - 3.0 %   Neutro Abs 2.5 1.4 - 7.7 K/uL   Lymphs Abs 2.2 0.7 - 4.0 K/uL   Monocytes Absolute 0.5 0.1 - 1.0 K/uL   Eosinophils Absolute 0.1 0.0 - 0.7 K/uL   Basophils Absolute 0.0 0.0 - 0.1 K/uL  TSH  Result Value Ref Range   TSH 2.45 0.35 - 5.50 uIU/mL  B. burgdorfi antibodies by WB  Result Value Ref Range   B burgdorferi IgG Abs (IB) NEGATIVE NEGATIVE   Lyme Disease 18 kD IgG NON-REACTIVE    Lyme Disease 23 kD IgG NON-REACTIVE    Lyme Disease 28 kD IgG NON-REACTIVE    Lyme Disease 30 kD IgG NON-REACTIVE    Lyme Disease 39 kD IgG NON-REACTIVE    Lyme Disease 41 kD IgG REACTIVE (A)    Lyme Disease 45 kD IgG NON-REACTIVE    Lyme Disease 58 kD IgG NON-REACTIVE    Lyme Disease 66 kD IgG NON-REACTIVE    Lyme Disease 93 kD IgG NON-REACTIVE    B burgdorferi IgM Abs (IB) NEGATIVE NEGATIVE   Lyme Disease 23 kD  IgM NON-REACTIVE    Lyme Disease 39 kD IgM NON-REACTIVE     Lyme Disease 41 kD IgM NON-REACTIVE   Ferritin  Result Value Ref Range   Ferritin 5.9 (L) 10.0 - 291.0 ng/mL  Vitamin B12  Result Value Ref Range   Vitamin B-12 208 (L) 211 - 911 pg/mL  POCT Skin KOH  Result Value Ref Range   Skin KOH, POC Positive (A) Negative    Assessment & Plan:  Requests dermatology referral to establish care in h/o eczema as well as for skin cancer screen.   Problem List Items Addressed This Visit     Non-celiac gluten sensitivity    H/o this previously tested negative for anti-TTG IgA.  Repeat testing today given above symptoms.       Abdominal discomfort - Primary    Chronic present for years, does better when pregnant.  Previously followed low gluten diet, not currently. Notes symptoms have been worse for the past 1.5 years after latest pregnancy (loose stools, GI upset, gassiness). Now with noted iron and b12 deficiencies, reasonable to retest. Will update celiac disease panel. Discussed possible GI referral even if negative testing.  Will recommend follow gluten free diet. celiac disease handout provided.       Relevant Orders   Celiac Pnl 2 rflx Endomysial Ab Ttr   Iron deficiency    Has had difficulty tolerating oral iron MWF however her OCP and prenatal vitamin have some iron. Update levels, consider iron infusion if no improvement noted.       Relevant Orders   Ferritin   IBC panel   Celiac Pnl 2 rflx Endomysial Ab Ttr   B12 deficiency    Dizziness is better with B12 replacement. Update b12 levels today then will treat with B12 shot. Continue oral replacement. Check IF Ab.       Relevant Orders   Vitamin B12   Intrinsic Factor Antibodies   Celiac Pnl 2 rflx Endomysial Ab Ttr   Other Visit Diagnoses     Diarrhea, unspecified type       Skin cancer screening       Relevant Orders   Ambulatory referral to Dermatology        Meds ordered this encounter  Medications   Prenatal Vit-Fe Fumarate-FA (PRENATAL VITAMIN PLUS LOW IRON)  27-1 MG TABS    Sig: Take 1 tablet by mouth daily.    Dispense:  30 tablet   cyanocobalamin (VITAMIN B12) injection 1,000 mcg   Orders Placed This Encounter  Procedures   Ferritin   IBC panel   Vitamin B12   Intrinsic Factor Antibodies   Celiac Pnl 2 rflx Endomysial Ab Ttr   Ambulatory referral to Dermatology    Referral Priority:   Routine    Referral Type:   Consultation    Referral Reason:   Specialty Services Required    Requested Specialty:   Dermatology    Number of Visits Requested:   1    Patient instructions: Labs today then B12 shot.  We will be in touch with results.   Follow up plan: Return if symptoms worsen or fail to improve.  Eustaquio Boyden, MD

## 2022-01-20 NOTE — Patient Instructions (Addendum)
Labs today then B12 shot.  We will be in touch with results.   Celiac Disease  Celiac disease is a condition in which the body's disease-fighting system (immune system) attacks the small intestine and causes damage. This is known as an autoimmune disease. When a person with celiac disease eats a food that has gluten in it, the immune system attacks the cells that line the small intestine. Over time, this reaction damages the small intestine and makes the small intestine unable to absorb nutrients from food. Gluten is found in wheat, rye, and barley, and in foods like pasta, pizza, and cereal. Celiac disease is also known as celiac sprue, nontropical sprue, and gluten-sensitive enteropathy. What are the causes? This condition is caused by a gene that is passed from parent to child (inherited). What increases the risk? You are more likely to develop this condition if you: Have a family member with the disease. Have an autoimmune condition, such as Type 1 diabetes or a thyroid disorder. Are female. What are the signs or symptoms? Symptoms of this condition include: Recurring bloating, pain in the abdomen, and gas. Long-term (chronic) diarrhea and pale, bad-smelling, greasy, or oily stool. Weight loss. Missed menstrual periods. Weak bones (osteoporosis). Fatigue and weakness. Tingling or other signs of nerve damage. Depression. Poor appetite. Rash. Anemia. In some cases, there are no symptoms of this condition. How is this diagnosed? This condition is diagnosed with a physical exam and tests. Tests may include: Blood tests to check for nutritional deficiencies. Blood tests to look for evidence that the body is attacking cells in the small intestine. A test in which a sample of tissue is taken from the small intestine and looked at under a microscope (biopsy). X-rays of the intestine. Stool tests. Tests to check for nutrient absorption from the intestine. How is this treated? There is  no cure for this condition. It is managed by following a strict gluten-free diet. Treatment may also involve avoiding dairy foods, such as milk and cheese, because they are difficult to digest. Most people who follow a strict gluten-free diet feel better and stop having symptoms. The intestine usually heals within 3 months to 2 years. In a small percentage of people, this condition does not improve on a gluten-free diet. If your condition does not improve, more tests may be done. You may also need to work with a celiac disease specialist to find the best treatment for you. Follow these instructions at home:  Follow instructions from your health care provider about what you may eat and drink. Monitor your body's response to the gluten-free diet. Write down any changes in your symptoms and in how you feel. If you eat while you are away from home, prepare your food ahead of time. Or, make sure that the place where you are going has gluten-free options. Suggest to family members that they get screened for early signs of celiac disease. Keep all follow-up visits. This is important. Contact a health care provider if: You continue to have symptoms, even when you are eating a gluten-free diet. You have trouble following a gluten-free diet. You develop an itchy rash with groups of tiny blisters. You develop severe weakness. You develop balance problems. You develop new symptoms. Summary Celiac disease is a condition in which the body's disease-fighting system (immune system) attacks the small intestine and causes damage. There is no cure for this condition. It is managed by following a strict gluten-free diet. For some people, following a low-dairy or dairy-free  diet also helps. Follow instructions from your health care provider about what you may eat and drink. Write down any changes in your symptoms and in how you feel. Contact a health care provider if you continue to have symptoms, even when you  are eating a gluten-free diet, or if you have new symptoms. Keep all follow-up visits. This is important. This information is not intended to replace advice given to you by your health care provider. Make sure you discuss any questions you have with your health care provider. Document Revised: 02/04/2021 Document Reviewed: 02/04/2021 Elsevier Patient Education  [place].

## 2022-01-20 NOTE — Assessment & Plan Note (Signed)
H/o this previously tested negative for anti-TTG IgA.  Repeat testing today given above symptoms.

## 2022-01-20 NOTE — Assessment & Plan Note (Signed)
Has had difficulty tolerating oral iron MWF however her OCP and prenatal vitamin have some iron. Update levels, consider iron infusion if no improvement noted.

## 2022-01-26 ENCOUNTER — Other Ambulatory Visit (HOSPITAL_COMMUNITY): Payer: Self-pay

## 2022-01-28 LAB — INTRINSIC FACTOR ANTIBODIES: Intrinsic Factor: NEGATIVE

## 2022-01-28 LAB — CELIAC PNL 2 RFLX ENDOMYSIAL AB TTR
(tTG) Ab, IgA: <1 U/mL
(tTG) Ab, IgG: <1 U/mL
Endomysial Ab IgA: NEGATIVE
Gliadin IgA: <1 U/mL
Gliadin IgG: 1.7 U/mL
Immunoglobulin A: 164 mg/dL (ref 47–310)

## 2022-01-29 ENCOUNTER — Encounter: Payer: Self-pay | Admitting: Family Medicine

## 2022-02-11 ENCOUNTER — Other Ambulatory Visit (HOSPITAL_COMMUNITY): Payer: Self-pay

## 2022-02-11 MED ORDER — BLISOVI 24 FE 1-20 MG-MCG(24) PO TABS
1.0000 | ORAL_TABLET | Freq: Every day | ORAL | 4 refills | Status: DC
  Filled 2022-02-11 – 2022-04-19 (×2): qty 84, 84d supply, fill #0
  Filled 2022-07-08: qty 84, 84d supply, fill #1
  Filled 2022-09-30: qty 84, 84d supply, fill #2
  Filled 2022-12-23: qty 84, 84d supply, fill #3

## 2022-02-12 LAB — HM PAP SMEAR

## 2022-04-01 ENCOUNTER — Telehealth: Payer: 59 | Admitting: Family

## 2022-04-01 DIAGNOSIS — R6889 Other general symptoms and signs: Secondary | ICD-10-CM

## 2022-04-01 MED ORDER — PREDNISONE 10 MG (21) PO TBPK: ORAL_TABLET | ORAL | 0 refills | Status: DC

## 2022-04-01 MED ORDER — BENZONATATE 100 MG PO CAPS: 100.0000 mg | ORAL_CAPSULE | Freq: Three times a day (TID) | ORAL | 0 refills | Status: DC | PRN

## 2022-04-01 MED ORDER — PROMETHAZINE-DM 6.25-15 MG/5ML PO SYRP: 5.0000 mL | ORAL_SOLUTION | Freq: Three times a day (TID) | ORAL | 0 refills | Status: DC | PRN

## 2022-04-01 NOTE — Progress Notes (Signed)
Virtual Visit Consent   Samantha Norman, you are scheduled for a virtual visit with a [facility] provider today. Just as with appointments in the office, your consent must be obtained to participate. Your consent will be active for this visit and any virtual visit you may have with one of our providers in the next 365 days. If you have a MyChart account, a copy of this consent can be sent to you electronically.  As this is a virtual visit, video technology does not allow for your provider to perform a traditional examination. This may limit your provider's ability to fully assess your condition. If your provider identifies any concerns that need to be evaluated in person or the need to arrange testing (such as labs, EKG, etc.), we will make arrangements to do so. Although advances in technology are sophisticated, we cannot ensure that it will always work on either your end or our end. If the connection with a video visit is poor, the visit may have to be switched to a telephone visit. With either a video or telephone visit, we are not always able to ensure that we have a secure connection.  By engaging in this virtual visit, you consent to the provision of healthcare and authorize for your insurance to be billed (if applicable) for the services provided during this visit. Depending on your insurance coverage, you may receive a charge related to this service.  I need to obtain your verbal consent now. Are you willing to proceed with your visit today? Samantha Norman has provided verbal consent on 04/01/2022 for a virtual visit (video or telephone). Samantha Dun, FNP  Date: 04/01/2022 6:12 PM  Virtual Visit via Video Note   I, Samantha Norman, connected with  Samantha Norman  ([id number], Jan 08, 1986) on 04/01/22 at  6:15 PM EST by a video-enabled telemedicine application and verified that I am speaking with the correct person using two identifiers.  Location: Patient: Virtual Visit Location  Patient: Home Provider: Virtual Visit Location Provider: Home Office   I discussed the limitations of evaluation and management by telemedicine and the availability of in person appointments. The patient expressed understanding and agreed to proceed.    History of Present Illness: Samantha Norman is a 37 y.o. who identifies as a female who was assigned female at birth, and is being seen today for flu like symptoms that started 4 days ago.  HPI: Influenza This is a new problem. The current episode started in the past 7 days. The problem occurs constantly. The problem has been gradually worsening. Associated symptoms include chills, congestion, coughing, fatigue, a fever, headaches, myalgias, nausea, a sore throat and vomiting. She has tried rest, acetaminophen and NSAIDs for the symptoms. The treatment provided mild relief.    Problems:  Patient Active Problem List   Diagnosis Date Noted   B12 deficiency 01/20/2022   Skin rash 10/29/2021   Pregnancy 11/21/2019   Hyperlipidemia 08/22/2019   Iron deficiency 08/22/2019   Low serum alkaline phosphatase 08/22/2019   Health maintenance examination 11/23/2017   NSVD (normal spontaneous vaginal delivery) 08/17/2017   Abdominal discomfort 07/08/2017   Non-celiac gluten sensitivity 04/03/2016   Thyroid nodule 01/23/2016   Sicca syndrome ([place]) 10/04/2015   Spondylolysis of lumbosacral region    Angioedema 02/06/2015   Palpitations 02/06/2015   Adjustment disorder with mixed anxiety and depressed mood 02/01/2014   Back pain 07/01/2012   Migraine without aura 04/19/2012   Paresthesias 04/08/2012   Vertigo 04/08/2012  Allergies: No Known Allergies Medications:  Current Outpatient Medications:    benzonatate (TESSALON PERLES) 100 MG capsule, Take 1 capsule (100 mg total) by mouth 3 (three) times daily as needed., Disp: 20 capsule, Rfl: 0   predniSONE (STERAPRED UNI-PAK 21 TAB) 10 MG (21) TBPK tablet, Use as directed, Disp: 21 tablet, Rfl:  0   promethazine-dextromethorphan (PROMETHAZINE-DM) 6.25-15 MG/5ML syrup, Take 5 mLs by mouth 3 (three) times daily as needed for cough., Disp: 118 mL, Rfl: 0   Cyanocobalamin (B-12) 1000 MCG SUBL, Place 1 tablet under the tongue daily., Disp: , Rfl:    Norethindrone Acetate-Ethinyl Estrad-FE (BLISOVI 24 FE) 1-20 MG-MCG(24) tablet, Take 1 tablet by mouth daily., Disp: 84 tablet, Rfl: 4   Prenatal Vit-Fe Fumarate-FA (PRENATAL VITAMIN PLUS LOW IRON) 27-1 MG TABS, Take 1 tablet by mouth daily., Disp: 30 tablet, Rfl:   Observations/Objective: Patient is well-developed, well-nourished in no acute distress.  Resting comfortably  at home.  Head is normocephalic, atraumatic.  No labored breathing.  Speech is clear and coherent with logical content.  Patient is alert and oriented at baseline.  Nasal congestion   Assessment and Plan: 1. Flu-like symptoms - predniSONE (STERAPRED UNI-PAK 21 TAB) 10 MG (21) TBPK tablet; Use as directed  Dispense: 21 tablet; Refill: 0 - benzonatate (TESSALON PERLES) 100 MG capsule; Take 1 capsule (100 mg total) by mouth 3 (three) times daily as needed.  Dispense: 20 capsule; Refill: 0 - promethazine-dextromethorphan (PROMETHAZINE-DM) 6.25-15 MG/5ML syrup; Take 5 mLs by mouth 3 (three) times daily as needed for cough.  Dispense: 118 mL; Refill: 0  Force fluids Rest Alternate tylenol and motrin  Droplet precautions  Follow up if symptoms worsen or do not improve   Follow Up Instructions: I discussed the assessment and treatment plan with the patient. The patient was provided an opportunity to ask questions and all were answered. The patient agreed with the plan and demonstrated an understanding of the instructions.  A copy of instructions were sent to the patient via MyChart unless otherwise noted below.    The patient was advised to call back or seek an in-person evaluation if the symptoms worsen or if the condition fails to improve as anticipated.  Time:  I spent  8 minutes with the patient via telehealth technology discussing the above problems/concerns.    Samantha Dun, FNP

## 2022-04-20 ENCOUNTER — Other Ambulatory Visit (HOSPITAL_COMMUNITY): Payer: Self-pay

## 2022-04-21 ENCOUNTER — Other Ambulatory Visit (HOSPITAL_COMMUNITY): Payer: Self-pay

## 2022-04-21 DIAGNOSIS — D2261 Melanocytic nevi of right upper limb, including shoulder: Secondary | ICD-10-CM | POA: Diagnosis not present

## 2022-04-21 DIAGNOSIS — D2272 Melanocytic nevi of left lower limb, including hip: Secondary | ICD-10-CM | POA: Diagnosis not present

## 2022-04-21 DIAGNOSIS — D2262 Melanocytic nevi of left upper limb, including shoulder: Secondary | ICD-10-CM | POA: Diagnosis not present

## 2022-04-21 DIAGNOSIS — B360 Pityriasis versicolor: Secondary | ICD-10-CM | POA: Diagnosis not present

## 2022-04-21 DIAGNOSIS — D225 Melanocytic nevi of trunk: Secondary | ICD-10-CM | POA: Diagnosis not present

## 2022-04-21 DIAGNOSIS — D2271 Melanocytic nevi of right lower limb, including hip: Secondary | ICD-10-CM | POA: Diagnosis not present

## 2022-04-21 MED ORDER — FLUCONAZOLE 200 MG PO TABS
400.0000 mg | ORAL_TABLET | ORAL | 0 refills | Status: AC
  Filled 2022-04-21: qty 4, 7d supply, fill #0

## 2022-07-08 ENCOUNTER — Other Ambulatory Visit: Payer: Self-pay

## 2022-08-11 ENCOUNTER — Telehealth: Payer: 59 | Admitting: Family Medicine

## 2022-08-11 DIAGNOSIS — N760 Acute vaginitis: Secondary | ICD-10-CM

## 2022-08-11 MED ORDER — FLUCONAZOLE 150 MG PO TABS: 150.0000 mg | ORAL_TABLET | Freq: Every day | ORAL | 0 refills | Status: DC

## 2022-08-11 MED ORDER — METRONIDAZOLE 500 MG PO TABS: 500.0000 mg | ORAL_TABLET | Freq: Two times a day (BID) | ORAL | 0 refills | Status: AC

## 2022-08-11 NOTE — Progress Notes (Signed)
E-Visit for Vaginal Symptoms  We are sorry that you are not feeling well. Here is how we plan to help! Based on what you shared with me it looks like you: May have a vaginosis due to bacteria  Vaginosis is an inflammation of the vagina that can result in discharge, itching and pain. The cause is usually a change in the normal balance of vaginal bacteria or an infection. Vaginosis can also result from reduced estrogen levels after menopause.  The most common causes of vaginosis are:   Bacterial vaginosis which results from an overgrowth of one on several organisms that are normally present in your vagina.   Yeast infections which are caused by a naturally occurring fungus called candida.   Vaginal atrophy (atrophic vaginosis) which results from the thinning of the vagina from reduced estrogen levels after menopause.   Trichomoniasis which is caused by a parasite and is commonly transmitted by sexual intercourse.  Factors that increase your risk of developing vaginosis include: Medications, such as antibiotics and steroids Uncontrolled diabetes Use of hygiene products such as bubble bath, vaginal spray or vaginal deodorant Douching Wearing damp or tight-fitting clothing Using an intrauterine device (IUD) for birth control Hormonal changes, such as those associated with pregnancy, birth control pills or menopause Sexual activity Having a sexually transmitted infection  Your treatment plan is Metronidazole or Flagyl 500mg  twice a day for 7 days.  I have electronically sent this prescription into the pharmacy that you have chosen. I will also order a Diflucan as well to cover for yeast.  Be sure to take all of the medication as directed. Stop taking any medication if you develop a rash, tongue swelling or shortness of breath. Mothers who are breast feeding should consider pumping and discarding their breast milk while on these antibiotics. However, there is no consensus that infant exposure  at these doses would be harmful.  Remember that medication creams can weaken latex condoms. Marland Kitchen   HOME CARE:  Good hygiene may prevent some types of vaginosis from recurring and may relieve some symptoms:  Avoid baths, hot tubs and whirlpool spas. Rinse soap from your outer genital area after a shower, and dry the area well to prevent irritation. Don't use scented or harsh soaps, such as those with deodorant or antibacterial action. Avoid irritants. These include scented tampons and pads. Wipe from front to back after using the toilet. Doing so avoids spreading fecal bacteria to your vagina.  Other things that may help prevent vaginosis include:  Don't douche. Your vagina doesn't require cleansing other than normal bathing. Repetitive douching disrupts the normal organisms that reside in the vagina and can actually increase your risk of vaginal infection. Douching won't clear up a vaginal infection. Use a latex condom. Both female and female latex condoms may help you avoid infections spread by sexual contact. Wear cotton underwear. Also wear pantyhose with a cotton crotch. If you feel comfortable without it, skip wearing underwear to bed. Yeast thrives in [employer] Your symptoms should improve in the next day or two.  GET HELP RIGHT AWAY IF:  You have pain in your lower abdomen ( pelvic area or over your ovaries) You develop nausea or vomiting You develop a fever Your discharge changes or worsens You have persistent pain with intercourse You develop shortness of breath, a rapid pulse, or you faint.  These symptoms could be signs of problems or infections that need to be evaluated by a medical provider now.  MAKE SURE YOU  Understand these instructions. Will watch your condition. Will get help right away if you are not doing well or get worse.  Thank you for choosing an e-visit.  Your e-visit answers were reviewed by a board certified advanced clinical practitioner to  complete your personal care plan. Depending upon the condition, your plan could have included both over the counter or prescription medications.  Please review your pharmacy choice. Make sure the pharmacy is open so you can pick up prescription now. If there is a problem, you may contact your provider through [employer] and have the prescription routed to another pharmacy.  Your safety is important to [place]. If you have drug allergies check your prescription carefully.   For the next 24 hours you can use MyChart to ask questions about today's visit, request a non-urgent call back, or ask for a work or school excuse. You will get an email in the next two days asking about your experience. I hope that your e-visit has been valuable and will speed your recovery.  I provided 5 minutes of non face-to-face time during this encounter for chart review, medication and order placement, as well as and documentation.

## 2022-08-19 DIAGNOSIS — H5213 Myopia, bilateral: Secondary | ICD-10-CM | POA: Diagnosis not present

## 2022-10-05 ENCOUNTER — Other Ambulatory Visit: Payer: Self-pay | Admitting: Family Medicine

## 2022-10-05 DIAGNOSIS — E538 Deficiency of other specified B group vitamins: Secondary | ICD-10-CM

## 2022-10-05 DIAGNOSIS — E7800 Pure hypercholesterolemia, unspecified: Secondary | ICD-10-CM

## 2022-10-05 DIAGNOSIS — M3500 Sicca syndrome, unspecified: Secondary | ICD-10-CM

## 2022-10-05 DIAGNOSIS — Z1159 Encounter for screening for other viral diseases: Secondary | ICD-10-CM

## 2022-10-05 DIAGNOSIS — E611 Iron deficiency: Secondary | ICD-10-CM

## 2022-10-07 ENCOUNTER — Other Ambulatory Visit (INDEPENDENT_AMBULATORY_CARE_PROVIDER_SITE_OTHER): Payer: 59

## 2022-10-07 DIAGNOSIS — E611 Iron deficiency: Secondary | ICD-10-CM | POA: Diagnosis not present

## 2022-10-07 DIAGNOSIS — M3500 Sicca syndrome, unspecified: Secondary | ICD-10-CM

## 2022-10-07 DIAGNOSIS — E7800 Pure hypercholesterolemia, unspecified: Secondary | ICD-10-CM

## 2022-10-07 DIAGNOSIS — E538 Deficiency of other specified B group vitamins: Secondary | ICD-10-CM

## 2022-10-07 DIAGNOSIS — Z1159 Encounter for screening for other viral diseases: Secondary | ICD-10-CM | POA: Diagnosis not present

## 2022-10-07 LAB — IBC PANEL
Iron: 158 ug/dL — ABNORMAL HIGH (ref 42–145)
Saturation Ratios: 46.3 % (ref 20.0–50.0)
TIBC: 341.6 ug/dL (ref 250.0–450.0)
Transferrin: 244 mg/dL (ref 212.0–360.0)

## 2022-10-07 LAB — URINALYSIS, ROUTINE W REFLEX MICROSCOPIC
Bilirubin Urine: NEGATIVE
Ketones, ur: 15 — AB
Nitrite: NEGATIVE
Specific Gravity, Urine: 1.025 (ref 1.000–1.030)
Urine Glucose: NEGATIVE
Urobilinogen, UA: 0.2 (ref 0.0–1.0)
pH: 6 (ref 5.0–8.0)

## 2022-10-07 LAB — FERRITIN: Ferritin: 32.4 ng/mL (ref 10.0–291.0)

## 2022-10-07 LAB — CBC WITH DIFFERENTIAL/PLATELET
Basophils Absolute: 0.1 10*3/uL (ref 0.0–0.1)
Basophils Relative: 1.3 % (ref 0.0–3.0)
Eosinophils Absolute: 0.2 10*3/uL (ref 0.0–0.7)
Eosinophils Relative: 3.4 % (ref 0.0–5.0)
HCT: 41.3 % (ref 36.0–46.0)
Hemoglobin: 13.8 g/dL (ref 12.0–15.0)
Lymphocytes Relative: 40.9 % (ref 12.0–46.0)
Lymphs Abs: 1.9 10*3/uL (ref 0.7–4.0)
MCHC: 33.5 g/dL (ref 30.0–36.0)
MCV: 95.2 fl (ref 78.0–100.0)
Monocytes Absolute: 0.3 10*3/uL (ref 0.1–1.0)
Monocytes Relative: 5.4 % (ref 3.0–12.0)
Neutro Abs: 2.3 10*3/uL (ref 1.4–7.7)
Neutrophils Relative %: 49 % (ref 43.0–77.0)
Platelets: 236 10*3/uL (ref 150.0–400.0)
RBC: 4.34 Mil/uL (ref 3.87–5.11)
RDW: 12.9 % (ref 11.5–15.5)
WBC: 4.7 10*3/uL (ref 4.0–10.5)

## 2022-10-07 LAB — COMPREHENSIVE METABOLIC PANEL
ALT: 17 U/L (ref 0–35)
AST: 18 U/L (ref 0–37)
Albumin: 4.4 g/dL (ref 3.5–5.2)
Alkaline Phosphatase: 26 U/L — ABNORMAL LOW (ref 39–117)
BUN: 17 mg/dL (ref 6–23)
CO2: 26 mEq/L (ref 19–32)
Calcium: 9.7 mg/dL (ref 8.4–10.5)
Chloride: 105 mEq/L (ref 96–112)
Creatinine, Ser: 1.04 mg/dL (ref 0.40–1.20)
GFR: 69.47 mL/min (ref >60.00)
Glucose, Bld: 81 mg/dL (ref 70–99)
Potassium: 4.3 mEq/L (ref 3.5–5.1)
Sodium: 139 mEq/L (ref 135–145)
Total Bilirubin: 1.7 mg/dL — ABNORMAL HIGH (ref 0.2–1.2)
Total Protein: 7 g/dL (ref 6.0–8.3)

## 2022-10-07 LAB — SEDIMENTATION RATE: Sed Rate: 8 mm/hr (ref 0–20)

## 2022-10-07 LAB — LIPID PANEL
Cholesterol: 184 mg/dL (ref 0–200)
HDL: 42.4 mg/dL (ref >39.00)
LDL Cholesterol: 122 mg/dL — ABNORMAL HIGH (ref 0–99)
NonHDL: 141.98
Total CHOL/HDL Ratio: 4
Triglycerides: 101 mg/dL (ref 0.0–149.0)
VLDL: 20.2 mg/dL (ref 0.0–40.0)

## 2022-10-07 LAB — C-REACTIVE PROTEIN: CRP: <1 mg/dL (ref 0.5–20.0)

## 2022-10-07 LAB — VITAMIN B12: Vitamin B-12: 394 pg/mL (ref 211–911)

## 2022-10-08 LAB — HEPATITIS C ANTIBODY: Hepatitis C Ab: NONREACTIVE

## 2022-10-13 ENCOUNTER — Other Ambulatory Visit: Payer: 59

## 2022-10-14 ENCOUNTER — Ambulatory Visit (INDEPENDENT_AMBULATORY_CARE_PROVIDER_SITE_OTHER): Payer: 59 | Admitting: Family Medicine

## 2022-10-14 ENCOUNTER — Encounter: Payer: Self-pay | Admitting: Family Medicine

## 2022-10-14 ENCOUNTER — Other Ambulatory Visit: Payer: Self-pay

## 2022-10-14 VITALS — BP 116/82 | HR 80 | Temp 97.6°F | Ht 67.5 in | Wt 148.4 lb

## 2022-10-14 DIAGNOSIS — E7800 Pure hypercholesterolemia, unspecified: Secondary | ICD-10-CM | POA: Diagnosis not present

## 2022-10-14 DIAGNOSIS — E538 Deficiency of other specified B group vitamins: Secondary | ICD-10-CM

## 2022-10-14 DIAGNOSIS — R109 Unspecified abdominal pain: Secondary | ICD-10-CM

## 2022-10-14 DIAGNOSIS — E611 Iron deficiency: Secondary | ICD-10-CM | POA: Diagnosis not present

## 2022-10-14 DIAGNOSIS — N200 Calculus of kidney: Secondary | ICD-10-CM

## 2022-10-14 DIAGNOSIS — Z0000 Encounter for general adult medical examination without abnormal findings: Secondary | ICD-10-CM

## 2022-10-14 DIAGNOSIS — K9041 Non-celiac gluten sensitivity: Secondary | ICD-10-CM

## 2022-10-14 DIAGNOSIS — M3500 Sicca syndrome, unspecified: Secondary | ICD-10-CM | POA: Diagnosis not present

## 2022-10-14 DIAGNOSIS — R3129 Other microscopic hematuria: Secondary | ICD-10-CM | POA: Diagnosis not present

## 2022-10-14 DIAGNOSIS — R748 Abnormal levels of other serum enzymes: Secondary | ICD-10-CM

## 2022-10-14 NOTE — Assessment & Plan Note (Signed)
 Preventative protocols reviewed and updated unless pt declined. Discussed healthy diet and lifestyle.  

## 2022-10-14 NOTE — Patient Instructions (Signed)
Schedule lab visit in 2-3 weeks to repeat urinalysis.  Good to see you today Return as needed or in 1-2 year for next physical.

## 2022-10-14 NOTE — Progress Notes (Unsigned)
Ph: [phone] Fax: [phone]   Patient ID: Samantha Norman, female    DOB: 06-12-1985, 37 y.o.   MRN: [id number]  This visit was conducted in person.  BP 116/82   Pulse 80   Temp 97.6 F (36.4 C) (Temporal)   Ht 5' 7.5" (1.715 m)   Wt 148 lb 6 oz (67.3 kg)   SpO2 99%   BMI 22.90 kg/m    CC: CPE Subjective:   HPI: Samantha Norman is a 37 y.o. female presenting on 10/14/2022 for Annual Exam   H/o gluten sensitivity without allergy.  Stopping gluten then lactose didn't seem to help.  Probiotic was helpful but not resolving issue.   Trouble tolerating oral iron.  Fmhx stroke in father due to afib (age 23s). Both parents are on statin.  Continues prenatal with iron.  No longer taking b12 - but MVI has daily.  Continues OCP Blisovi 24 Fe as well.   Significant difficulty to barley soup.   Known sicca syndrome. Previously saw [facility] Rheumatology Dr Nickola Major.   Preventative: Well woman with [facility] (Dr Langston Masker). UTD pap smears.  LMP - no regular periods on OCP Flu shot yearly Tdap 05/2017 COVID vaccine - x3 Seat belt use discussed  Sunscreen use discussed. No changing moles on skin. Saw [place] derm last year with reassuring exam.  Sleep - averaging 7-8 hours/night  Non smoker  Alcohol - none  Dentist Q6 mo Eye exam yearly   Married. 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 Interim medical history since our last visit reviewed. Allergies and medications reviewed and updated. Outpatient Medications Prior to Visit  Medication Sig Dispense Refill   Cyanocobalamin (B-12) 1000 MCG SUBL Place 1 tablet under the tongue daily.     Norethindrone Acetate-Ethinyl Estrad-FE (BLISOVI 24 FE)  1-20 MG-MCG(24) tablet Take 1 tablet by mouth daily. 84 tablet 4   Prenatal Vit-Fe Fumarate-FA (PRENATAL VITAMIN PLUS LOW IRON) 27-1 MG TABS Take 1 tablet by mouth daily. 30 tablet    benzonatate (TESSALON PERLES) 100 MG capsule Take 1 capsule (100 mg total) by mouth 3 (three) times daily as needed. 20 capsule 0   fluconazole (DIFLUCAN) 150 MG tablet Take 1 tablet (150 mg total) by mouth daily. 1 tablet 0   predniSONE (STERAPRED UNI-PAK 21 TAB) 10 MG (21) TBPK tablet Use as directed 21 tablet 0   promethazine-dextromethorphan (PROMETHAZINE-DM) 6.25-15 MG/5ML syrup Take 5 mLs by mouth 3 (three) times daily as needed for cough. 118 mL 0   No facility-administered medications prior to visit.     Per HPI unless specifically indicated in ROS section below Review of Systems  Constitutional:  Negative for chills, fatigue and fever.  Gastrointestinal:  Positive for diarrhea. Negative for abdominal pain, constipation, nausea and vomiting.       Abd discomfort  Genitourinary:  Negative for hematuria.  Skin:  Negative for rash.  Hematological:  Does not bruise/bleed easily.  Psychiatric/Behavioral:  Negative for dysphoric mood. The patient is not nervous/anxious.     Objective:  BP 116/82   Pulse 80   Temp 97.6 F (36.4 C) (Temporal)   Ht 5' 7.5" (1.715 m)   Wt 148 lb 6 oz (67.3 kg)   SpO2 99%  BMI 22.90 kg/m   Wt Readings from Last 3 Encounters:  10/14/22 148 lb 6 oz (67.3 kg)  01/20/22 150 lb 4 oz (68.2 kg)  10/29/21 149 lb 2 oz (67.6 kg)      Physical Exam Vitals and nursing note reviewed.  Constitutional:      Appearance: Normal appearance. She is not ill-appearing.  HENT:     Head: Normocephalic and atraumatic.     Right Ear: Tympanic membrane, ear canal and external ear normal. There is no impacted cerumen.     Left Ear: Tympanic membrane, ear canal and external ear normal. There is no impacted cerumen.     Mouth/Throat:     Mouth: Mucous membranes are moist.     Pharynx:  Oropharynx is clear. No oropharyngeal exudate or posterior oropharyngeal erythema.  Eyes:     General:        Right eye: No discharge.        Left eye: No discharge.     Extraocular Movements: Extraocular movements intact.     Conjunctiva/sclera: Conjunctivae normal.     Pupils: Pupils are equal, round, and reactive to light.  Neck:     Thyroid: No thyroid mass or thyromegaly.  Cardiovascular:     Rate and Rhythm: Normal rate and regular rhythm.     Pulses: Normal pulses.     Heart sounds: Normal heart sounds. No murmur heard. Pulmonary:     Effort: Pulmonary effort is normal. No respiratory distress.     Breath sounds: Normal breath sounds. No wheezing, rhonchi or rales.  Abdominal:     General: Bowel sounds are normal. There is no distension.     Palpations: Abdomen is soft. There is no mass.     Tenderness: There is no abdominal tenderness. There is no guarding or rebound.     Hernia: No hernia is present.  Musculoskeletal:     Cervical back: Normal range of motion and neck supple. No rigidity.     Right lower leg: No edema.     Left lower leg: No edema.  Lymphadenopathy:     Cervical: No cervical adenopathy.  Skin:    General: Skin is warm and dry.     Findings: No rash.  Neurological:     General: No focal deficit present.     Mental Status: She is alert. Mental status is at baseline.  Psychiatric:        Mood and Affect: Mood normal.        Behavior: Behavior normal.       Results for orders placed or performed in visit on 10/07/22  Urinalysis, Routine w reflex microscopic  Result Value Ref Range   Color, Urine YELLOW Yellow;Lt. Yellow;Straw;Dark Yellow;Amber;Green;Red;Brown   APPearance Sl Cloudy (A) Clear;Turbid;Slightly Cloudy;Cloudy   Specific Gravity, Urine 1.025 1.000 - 1.030   pH 6.0 5.0 - 8.0   Total Protein, Urine TRACE (A) Negative   Urine Glucose NEGATIVE Negative   Ketones, ur 15 (A) Negative   Bilirubin Urine NEGATIVE Negative   Hgb urine dipstick  TRACE-INTACT (A) Negative   Urobilinogen, UA 0.2 0.0 - 1.0   Leukocytes,Ua TRACE (A) Negative   Nitrite NEGATIVE Negative   WBC, UA 3-6/hpf (A) 0-2/hpf   RBC / HPF 3-6/hpf (A) 0-2/hpf   Mucus, UA Presence of (A) None   Squamous Epithelial / HPF Many(>10/hpf) (A) Rare(0-4/hpf)   Bacteria, UA Rare(<10/hpf) (A) None   Ca Oxalate Crys, UA Presence of (A) None   Yeast, UA  Presence of (A) None  C-reactive protein  Result Value Ref Range   CRP <1.0 0.5 - 20.0 mg/dL  Sedimentation rate  Result Value Ref Range   Sed Rate 8 0 - 20 mm/hr  Hepatitis C antibody  Result Value Ref Range   Hepatitis C Ab NON-REACTIVE NON-REACTIVE  CBC with Differential/Platelet  Result Value Ref Range   WBC 4.7 4.0 - 10.5 K/uL   RBC 4.34 3.87 - 5.11 Mil/uL   Hemoglobin 13.8 12.0 - 15.0 g/dL   HCT 40.9 81.1 - 91.4 %   MCV 95.2 78.0 - 100.0 fl   MCHC 33.5 30.0 - 36.0 g/dL   RDW 78.2 95.6 - 21.3 %   Platelets 236.0 150.0 - 400.0 K/uL   Neutrophils Relative % 49.0 43.0 - 77.0 %   Lymphocytes Relative 40.9 12.0 - 46.0 %   Monocytes Relative 5.4 3.0 - 12.0 %   Eosinophils Relative 3.4 0.0 - 5.0 %   Basophils Relative 1.3 0.0 - 3.0 %   Neutro Abs 2.3 1.4 - 7.7 K/uL   Lymphs Abs 1.9 0.7 - 4.0 K/uL   Monocytes Absolute 0.3 0.1 - 1.0 K/uL   Eosinophils Absolute 0.2 0.0 - 0.7 K/uL   Basophils Absolute 0.1 0.0 - 0.1 K/uL  IBC panel  Result Value Ref Range   Iron 158 (H) 42 - 145 ug/dL   Transferrin 086.5 784.6 - 360.0 mg/dL   Saturation Ratios 96.2 20.0 - 50.0 %   TIBC 341.6 250.0 - 450.0 mcg/dL  Ferritin  Result Value Ref Range   Ferritin 32.4 10.0 - 291.0 ng/mL  Vitamin B12  Result Value Ref Range   Vitamin B-12 394 211 - 911 pg/mL  Comprehensive metabolic panel  Result Value Ref Range   Sodium 139 135 - 145 mEq/L   Potassium 4.3 3.5 - 5.1 mEq/L   Chloride 105 96 - 112 mEq/L   CO2 26 19 - 32 mEq/L   Glucose, Bld 81 70 - 99 mg/dL   BUN 17 6 - 23 mg/dL   Creatinine, Ser 9.52 0.40 - 1.20 mg/dL   Total  Bilirubin 1.7 (H) 0.2 - 1.2 mg/dL   Alkaline Phosphatase 26 (L) 39 - 117 U/L   AST 18 0 - 37 U/L   ALT 17 0 - 35 U/L   Total Protein 7.0 6.0 - 8.3 g/dL   Albumin 4.4 3.5 - 5.2 g/dL   GFR 84.13 >24.40 mL/min   Calcium 9.7 8.4 - 10.5 mg/dL  Lipid panel  Result Value Ref Range   Cholesterol 184 0 - 200 mg/dL   Triglycerides 102.7 0.0 - 149.0 mg/dL   HDL 25.36 >64.40 mg/dL   VLDL 34.7 0.0 - 42.5 mg/dL   LDL Cholesterol 956 (H) 0 - 99 mg/dL   Total CHOL/HDL Ratio 4    NonHDL 141.98    Lab Results  Component Value Date   TSH 2.45 10/29/2021    Assessment & Plan:   Problem List Items Addressed This Visit     Health maintenance examination - Primary (Chronic)    Preventative protocols reviewed and updated unless pt declined. Discussed healthy diet and lifestyle.       Sicca syndrome (HCC)    Stable period. Recent inflammatory markers were normal.       Non-celiac gluten sensitivity   Abdominal discomfort    Anticipate multifactorial - celiac sensitivity, lactose intolerance.  Possible IBS given marked intolerance to barley.  Continue probiotics. Consider levsin vs IBgard trial, consider low-FODMAP diet.  Consider GI eval for SIBO as we don't do carbohydrate breath test here.       Hyperlipidemia    Cholesterol levels stable off treatment.       Iron deficiency    Iron levels stable with iron in prenatal MVI and OCP.       Low serum alkaline phosphatase    Low alk phos persists, now with elevated total bili but other LFTs stable - consider hypothyroidism, pernicious anemia, zinc deficiency. Consider GGT.       B12 deficiency    B12 levels stable with b12 in prenatal vitamin - continue.       Nephrolithiasis    H/o this, no recent symptoms.       Microhematuria    Recent UA with tr Norman - will repeat in a few weeks. No symptoms.       Relevant Orders   Urinalysis, Routine w reflex microscopic     No orders of the defined types were placed in this  encounter.   Orders Placed This Encounter  Procedures   Urinalysis, Routine w reflex microscopic    Standing Status:   Future    Standing Expiration Date:   10/15/2023    Patient Instructions  Schedule lab visit in 2-3 weeks to repeat urinalysis.  Good to see you today Return as needed or in 1-2 year for next physical.   Follow up plan: Return in about 1 year (around 10/14/2023) for annual exam, prior fasting for Norman work.  Eustaquio Boyden, MD

## 2022-10-15 DIAGNOSIS — R3129 Other microscopic hematuria: Secondary | ICD-10-CM | POA: Insufficient documentation

## 2022-10-15 NOTE — Assessment & Plan Note (Signed)
Cholesterol levels stable off treatment.

## 2022-10-15 NOTE — Assessment & Plan Note (Signed)
H/o this, no recent symptoms.

## 2022-10-15 NOTE — Assessment & Plan Note (Addendum)
Low alk phos persists, now with elevated total bili but other LFTs stable - consider hypothyroidism, pernicious anemia, zinc deficiency. Consider GGT.

## 2022-10-15 NOTE — Assessment & Plan Note (Signed)
Stable period. Recent inflammatory markers were normal.

## 2022-10-15 NOTE — Assessment & Plan Note (Signed)
Recent UA with tr blood - will repeat in a few weeks. No symptoms.

## 2022-10-15 NOTE — Assessment & Plan Note (Signed)
Iron levels stable with iron in prenatal MVI and OCP.

## 2022-10-15 NOTE — Assessment & Plan Note (Signed)
Anticipate multifactorial - celiac sensitivity, lactose intolerance.  Possible IBS given marked intolerance to barley.  Continue probiotics. Consider levsin vs IBgard trial, consider low-FODMAP diet.  Consider GI eval for SIBO as we don't do carbohydrate breath test here.

## 2022-10-15 NOTE — Assessment & Plan Note (Signed)
B12 levels stable with b12 in prenatal vitamin - continue.

## 2022-10-15 NOTE — Progress Notes (Incomplete)
Ph: [phone] Fax: [phone]   Patient ID: Samantha Norman, female    DOB: [birth year], 37 y.o.   MRN: [id number]  This visit was conducted in person.  BP 116/82   Pulse 80   Temp 97.6 F (36.4 C) (Temporal)   Ht 5' 7.5" (1.715 m)   Wt 148 lb 6 oz (67.3 kg)   SpO2 99%   BMI 22.90 kg/m    CC: CPE Subjective:   HPI: Samantha Norman is a 37 y.o. female presenting on 10/14/2022 for Annual Exam   H/o gluten sensitivity without allergy.  Stopping gluten then lactose didn't seem to help.  Probiotic was helpful but not resolving issue.   Trouble tolerating oral iron.  Fmhx stroke in father due to afib (age 27s). Both parents are on statin.  Continues prenatal with iron.  No longer taking b12 - but MVI has daily.  Continues OCP Blisovi 24 Fe as well.   Significant difficulty to barley soup.   Known sicca syndrome. Previously saw [facility] Rheumatology Dr Nickola Major.   Preventative: Well woman with [facility] (Dr Langston Masker). UTD pap smears.  LMP - no regular periods on OCP Flu shot yearly Tdap 05/2017 COVID vaccine - x3 Seat belt use discussed  Sunscreen use discussed. No changing moles on skin. Saw [place] derm last year with reassuring exam.  Sleep - averaging 7-8 hours/night  Non smoker  Alcohol - none  Dentist Q6 mo Eye exam yearly   Married. 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 Interim medical history since our last visit reviewed. Allergies and medications reviewed and updated. Outpatient Medications Prior to Visit  Medication Sig Dispense Refill  . Cyanocobalamin (B-12) 1000 MCG SUBL Place 1 tablet under the tongue daily.    . Norethindrone Acetate-Ethinyl Estrad-FE (BLISOVI 24 FE)  1-20 MG-MCG(24) tablet Take 1 tablet by mouth daily. 84 tablet 4  . Prenatal Vit-Fe Fumarate-FA (PRENATAL VITAMIN PLUS LOW IRON) 27-1 MG TABS Take 1 tablet by mouth daily. 30 tablet   . benzonatate (TESSALON PERLES) 100 MG capsule Take 1 capsule (100 mg total) by mouth 3 (three) times daily as needed. 20 capsule 0  . fluconazole (DIFLUCAN) 150 MG tablet Take 1 tablet (150 mg total) by mouth daily. 1 tablet 0  . predniSONE (STERAPRED UNI-PAK 21 TAB) 10 MG (21) TBPK tablet Use as directed 21 tablet 0  . promethazine-dextromethorphan (PROMETHAZINE-DM) 6.25-15 MG/5ML syrup Take 5 mLs by mouth 3 (three) times daily as needed for cough. 118 mL 0   No facility-administered medications prior to visit.     Per HPI unless specifically indicated in ROS section below Review of Systems  Constitutional:  Negative for chills, fatigue and fever.  HENT:  Negative for congestion.   Gastrointestinal:  Positive for diarrhea. Negative for abdominal pain and constipation.       Intermittent discomfort  Psychiatric/Behavioral:  Negative for dysphoric mood. The patient is not nervous/anxious.     Objective:  BP 116/82   Pulse 80   Temp 97.6 F (36.4 C) (Temporal)   Ht 5' 7.5" (1.715 m)   Wt 148 lb 6 oz (67.3 kg)   SpO2 99%   BMI 22.90 kg/m   Wt Readings from Last 3 Encounters:  10/14/22 148 lb 6 oz (67.3 kg)  01/20/22 150 lb 4 oz (68.2 kg)  10/29/21 149 lb 2 oz (67.6 kg)      Physical Exam Vitals and nursing note reviewed.  Constitutional:      Appearance: Normal appearance. She is not ill-appearing.  HENT:     Head: Normocephalic and atraumatic.     Right Ear: Tympanic membrane, ear canal and external ear normal. There is no impacted cerumen.     Left Ear: Tympanic membrane, ear canal and external ear normal. There is no impacted cerumen.  Eyes:     General:        Right eye: No discharge.        Left eye: No discharge.     Extraocular Movements: Extraocular movements intact.      Conjunctiva/sclera: Conjunctivae normal.     Pupils: Pupils are equal, round, and reactive to light.  Neck:     Thyroid: No thyroid mass or thyromegaly.  Cardiovascular:     Rate and Rhythm: Normal rate and regular rhythm.     Pulses: Normal pulses.     Heart sounds: Normal heart sounds. No murmur heard. Pulmonary:     Effort: Pulmonary effort is normal. No respiratory distress.     Breath sounds: Normal breath sounds. No wheezing, rhonchi or rales.  Abdominal:     General: Bowel sounds are normal. There is no distension.     Palpations: Abdomen is soft. There is no mass.     Tenderness: There is generalized abdominal tenderness (mild). There is no guarding or rebound. Negative signs include Murphy's sign.     Hernia: No hernia is present.  Musculoskeletal:     Cervical back: Normal range of motion and neck supple. No rigidity.     Right lower leg: No edema.     Left lower leg: No edema.  Lymphadenopathy:     Cervical: No cervical adenopathy.  Skin:    General: Skin is warm and dry.     Findings: No rash.  Neurological:     General: No focal deficit present.     Mental Status: She is alert. Mental status is at baseline.  Psychiatric:        Mood and Affect: Mood normal.        Behavior: Behavior normal.       Results for orders placed or performed in visit on 10/07/22  Urinalysis, Routine w reflex microscopic  Result Value Ref Range   Color, Urine YELLOW Yellow;Lt. Yellow;Straw;Dark Yellow;Amber;Green;Red;Brown   APPearance Sl Cloudy (A) Clear;Turbid;Slightly Cloudy;Cloudy   Specific Gravity, Urine 1.025 1.000 - 1.030   pH 6.0 5.0 - 8.0   Total Protein, Urine TRACE (A) Negative   Urine Glucose NEGATIVE Negative   Ketones, ur 15 (A) Negative   Bilirubin Urine NEGATIVE Negative   Hgb urine dipstick TRACE-INTACT (A) Negative   Urobilinogen, UA 0.2 0.0 - 1.0   Leukocytes,Ua TRACE (A) Negative   Nitrite NEGATIVE Negative   WBC, UA 3-6/hpf (A) 0-2/hpf   RBC / HPF 3-6/hpf  (A) 0-2/hpf   Mucus, UA Presence of (A) None   Squamous Epithelial / HPF Many(>10/hpf) (A) Rare(0-4/hpf)   Bacteria, UA Rare(<10/hpf) (A) None   Ca Oxalate Crys, UA Presence of (A) None   Yeast, UA Presence of (A) None  C-reactive protein  Result Value Ref Range   CRP <1.0 0.5 - 20.0 mg/dL  Sedimentation rate  Result Value Ref Range   Sed Rate 8 0 -  20 mm/hr  Hepatitis C antibody  Result Value Ref Range   Hepatitis C Ab NON-REACTIVE NON-REACTIVE  CBC with Differential/Platelet  Result Value Ref Range   WBC 4.7 4.0 - 10.5 K/uL   RBC 4.34 3.87 - 5.11 Mil/uL   Hemoglobin 13.8 12.0 - 15.0 g/dL   HCT 57.8 46.9 - 62.9 %   MCV 95.2 78.0 - 100.0 fl   MCHC 33.5 30.0 - 36.0 g/dL   RDW 52.8 41.3 - 24.4 %   Platelets 236.0 150.0 - 400.0 K/uL   Neutrophils Relative % 49.0 43.0 - 77.0 %   Lymphocytes Relative 40.9 12.0 - 46.0 %   Monocytes Relative 5.4 3.0 - 12.0 %   Eosinophils Relative 3.4 0.0 - 5.0 %   Basophils Relative 1.3 0.0 - 3.0 %   Neutro Abs 2.3 1.4 - 7.7 K/uL   Lymphs Abs 1.9 0.7 - 4.0 K/uL   Monocytes Absolute 0.3 0.1 - 1.0 K/uL   Eosinophils Absolute 0.2 0.0 - 0.7 K/uL   Basophils Absolute 0.1 0.0 - 0.1 K/uL  IBC panel  Result Value Ref Range   Iron 158 (H) 42 - 145 ug/dL   Transferrin 010.2 725.3 - 360.0 mg/dL   Saturation Ratios 66.4 20.0 - 50.0 %   TIBC 341.6 250.0 - 450.0 mcg/dL  Ferritin  Result Value Ref Range   Ferritin 32.4 10.0 - 291.0 ng/mL  Vitamin B12  Result Value Ref Range   Vitamin B-12 394 211 - 911 pg/mL  Comprehensive metabolic panel  Result Value Ref Range   Sodium 139 135 - 145 mEq/L   Potassium 4.3 3.5 - 5.1 mEq/L   Chloride 105 96 - 112 mEq/L   CO2 26 19 - 32 mEq/L   Glucose, Bld 81 70 - 99 mg/dL   BUN 17 6 - 23 mg/dL   Creatinine, Ser 4.03 0.40 - 1.20 mg/dL   Total Bilirubin 1.7 (H) 0.2 - 1.2 mg/dL   Alkaline Phosphatase 26 (L) 39 - 117 U/L   AST 18 0 - 37 U/L   ALT 17 0 - 35 U/L   Total Protein 7.0 6.0 - 8.3 g/dL   Albumin 4.4 3.5 -  5.2 g/dL   GFR 47.42 >59.56 mL/min   Calcium 9.7 8.4 - 10.5 mg/dL  Lipid panel  Result Value Ref Range   Cholesterol 184 0 - 200 mg/dL   Triglycerides 387.5 0.0 - 149.0 mg/dL   HDL 64.33 >29.51 mg/dL   VLDL 88.4 0.0 - 16.6 mg/dL   LDL Cholesterol 063 (H) 0 - 99 mg/dL   Total CHOL/HDL Ratio 4    NonHDL 141.98       10/14/2022    4:03 PM 08/22/2019    2:26 PM 11/23/2017    2:08 PM 09/21/2016    1:42 PM 09/21/2016    1:27 PM  Depression screen PHQ 2/9  Decreased Interest 0 0  0 0  Down, Depressed, Hopeless 0 0  0 0  PHQ - 2 Score 0 0  0 0  Altered sleeping  0  0 0  Tired, decreased energy  0  0 0  Change in appetite  0  0 0  Feeling bad or failure about yourself   0  0 0  Trouble concentrating  0  0 0  Moving slowly or fidgety/restless  0  0 0  Suicidal thoughts  0  0 0  PHQ-9 Score  0  0 0     Information is confidential and restricted.  Go to Review Flowsheets to unlock data.   Assessment & Plan:   Problem List Items Addressed This Visit     Health maintenance examination - Primary (Chronic)    Preventative protocols reviewed and updated unless pt declined. Discussed healthy diet and lifestyle.       Nephrolithiasis    H/o this, no recent symptoms.       Microhematuria    Recent UA with tr Norman - will repeat in a few weeks. No symptoms.       Relevant Orders   Urinalysis, Routine w reflex microscopic     No orders of the defined types were placed in this encounter.   Orders Placed This Encounter  Procedures  . Urinalysis, Routine w reflex microscopic    Standing Status:   Future    Standing Expiration Date:   10/15/2023    Patient Instructions  Schedule lab visit in 2-3 weeks to repeat urinalysis.  Good to see you today Return as needed or in 1-2 year for next physical.   Follow up plan: Return in about 1 year (around 10/14/2023) for annual exam, prior fasting for Norman work.  Eustaquio Boyden, MD

## 2022-10-19 NOTE — Progress Notes (Signed)
Ph: [phone] Fax: [phone]   Patient ID: Samantha Norman, female    DOB: [birth year], 37 y.o.   MRN: [id number]  This visit was conducted in person.  BP 116/82   Pulse 80   Temp 97.6 F (36.4 C) (Temporal)   Ht 5' 7.5" (1.715 m)   Wt 148 lb 6 oz (67.3 kg)   SpO2 99%   BMI 22.90 kg/m    CC: CPE Subjective:   HPI: Samantha Norman is a 37 y.o. female presenting on 10/14/2022 for Annual Exam   H/o gluten sensitivity without allergy.  Stopping gluten then lactose didn't seem to help.  Probiotic was helpful but not resolving issue.   Trouble tolerating oral iron.  Fmhx stroke in father due to afib (age 83s). Both parents are on statin.  Continues prenatal with iron.  No longer taking b12 - but MVI has daily.  Continues OCP Blisovi 24 Fe as well.   Significant difficulty to barley soup.   Known sicca syndrome. Previously saw [facility] Rheumatology Dr Nickola Major.   Preventative: Well woman with [facility] (Dr Langston Masker). UTD pap smears.  LMP - no regular periods on OCP Flu shot yearly Tdap 05/2017 COVID vaccine - x3 Seat belt use discussed  Sunscreen use discussed. No changing moles on skin. Saw [place] derm last year with reassuring exam.  Sleep - averaging 7-8 hours/night  Non smoker  Alcohol - none  Dentist Q6 mo Eye exam yearly   Married. 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 Interim medical history since our last visit reviewed. Allergies and medications reviewed and updated. Outpatient Medications Prior to Visit  Medication Sig Dispense Refill   Cyanocobalamin (B-12) 1000 MCG SUBL Place 1 tablet under the tongue daily.     Norethindrone Acetate-Ethinyl Estrad-FE (BLISOVI 24 FE)  1-20 MG-MCG(24) tablet Take 1 tablet by mouth daily. 84 tablet 4   Prenatal Vit-Fe Fumarate-FA (PRENATAL VITAMIN PLUS LOW IRON) 27-1 MG TABS Take 1 tablet by mouth daily. 30 tablet    benzonatate (TESSALON PERLES) 100 MG capsule Take 1 capsule (100 mg total) by mouth 3 (three) times daily as needed. 20 capsule 0   fluconazole (DIFLUCAN) 150 MG tablet Take 1 tablet (150 mg total) by mouth daily. 1 tablet 0   predniSONE (STERAPRED UNI-PAK 21 TAB) 10 MG (21) TBPK tablet Use as directed 21 tablet 0   promethazine-dextromethorphan (PROMETHAZINE-DM) 6.25-15 MG/5ML syrup Take 5 mLs by mouth 3 (three) times daily as needed for cough. 118 mL 0   No facility-administered medications prior to visit.     Per HPI unless specifically indicated in ROS section below Review of Systems  Constitutional:  Negative for chills, fatigue and fever.  HENT:  Negative for congestion.   Gastrointestinal:  Positive for diarrhea. Negative for abdominal pain and constipation.       Intermittent discomfort  Psychiatric/Behavioral:  Negative for dysphoric mood. The patient is not nervous/anxious.     Objective:  BP 116/82   Pulse 80   Temp 97.6 F (36.4 C) (Temporal)   Ht 5' 7.5" (1.715 m)   Wt 148 lb 6 oz (67.3 kg)   SpO2 99%   BMI 22.90 kg/m   Wt Readings from Last 3 Encounters:  10/14/22 148 lb 6 oz (67.3 kg)  01/20/22 150 lb 4 oz (68.2 kg)  10/29/21 149 lb 2 oz (67.6 kg)      Physical Exam Vitals and nursing note reviewed.  Constitutional:      Appearance: Normal appearance. She is not ill-appearing.  HENT:     Head: Normocephalic and atraumatic.     Right Ear: Tympanic membrane, ear canal and external ear normal. There is no impacted cerumen.     Left Ear: Tympanic membrane, ear canal and external ear normal. There is no impacted cerumen.  Eyes:     General:        Right eye: No discharge.        Left eye: No discharge.     Extraocular Movements: Extraocular movements intact.      Conjunctiva/sclera: Conjunctivae normal.     Pupils: Pupils are equal, round, and reactive to light.  Neck:     Thyroid: No thyroid mass or thyromegaly.  Cardiovascular:     Rate and Rhythm: Normal rate and regular rhythm.     Pulses: Normal pulses.     Heart sounds: Normal heart sounds. No murmur heard. Pulmonary:     Effort: Pulmonary effort is normal. No respiratory distress.     Breath sounds: Normal breath sounds. No wheezing, rhonchi or rales.  Abdominal:     General: Bowel sounds are normal. There is no distension.     Palpations: Abdomen is soft. There is no mass.     Tenderness: There is generalized abdominal tenderness (mild). There is no guarding or rebound. Negative signs include Murphy's sign.     Hernia: No hernia is present.  Musculoskeletal:     Cervical back: Normal range of motion and neck supple. No rigidity.     Right lower leg: No edema.     Left lower leg: No edema.  Lymphadenopathy:     Cervical: No cervical adenopathy.  Skin:    General: Skin is warm and dry.     Findings: No rash.  Neurological:     General: No focal deficit present.     Mental Status: She is alert. Mental status is at baseline.  Psychiatric:        Mood and Affect: Mood normal.        Behavior: Behavior normal.       Results for orders placed or performed in visit on 10/07/22  Urinalysis, Routine w reflex microscopic  Result Value Ref Range   Color, Urine YELLOW Yellow;Lt. Yellow;Straw;Dark Yellow;Amber;Green;Red;Brown   APPearance Sl Cloudy (A) Clear;Turbid;Slightly Cloudy;Cloudy   Specific Gravity, Urine 1.025 1.000 - 1.030   pH 6.0 5.0 - 8.0   Total Protein, Urine TRACE (A) Negative   Urine Glucose NEGATIVE Negative   Ketones, ur 15 (A) Negative   Bilirubin Urine NEGATIVE Negative   Hgb urine dipstick TRACE-INTACT (A) Negative   Urobilinogen, UA 0.2 0.0 - 1.0   Leukocytes,Ua TRACE (A) Negative   Nitrite NEGATIVE Negative   WBC, UA 3-6/hpf (A) 0-2/hpf   RBC / HPF 3-6/hpf  (A) 0-2/hpf   Mucus, UA Presence of (A) None   Squamous Epithelial / HPF Many(>10/hpf) (A) Rare(0-4/hpf)   Bacteria, UA Rare(<10/hpf) (A) None   Ca Oxalate Crys, UA Presence of (A) None   Yeast, UA Presence of (A) None  C-reactive protein  Result Value Ref Range   CRP <1.0 0.5 - 20.0 mg/dL  Sedimentation rate  Result Value Ref Range   Sed Rate 8 0 -  20 mm/hr  Hepatitis C antibody  Result Value Ref Range   Hepatitis C Ab NON-REACTIVE NON-REACTIVE  CBC with Differential/Platelet  Result Value Ref Range   WBC 4.7 4.0 - 10.5 K/uL   RBC 4.34 3.87 - 5.11 Mil/uL   Hemoglobin 13.8 12.0 - 15.0 g/dL   HCT 62.9 52.8 - 41.3 %   MCV 95.2 78.0 - 100.0 fl   MCHC 33.5 30.0 - 36.0 g/dL   RDW 24.4 01.0 - 27.2 %   Platelets 236.0 150.0 - 400.0 K/uL   Neutrophils Relative % 49.0 43.0 - 77.0 %   Lymphocytes Relative 40.9 12.0 - 46.0 %   Monocytes Relative 5.4 3.0 - 12.0 %   Eosinophils Relative 3.4 0.0 - 5.0 %   Basophils Relative 1.3 0.0 - 3.0 %   Neutro Abs 2.3 1.4 - 7.7 K/uL   Lymphs Abs 1.9 0.7 - 4.0 K/uL   Monocytes Absolute 0.3 0.1 - 1.0 K/uL   Eosinophils Absolute 0.2 0.0 - 0.7 K/uL   Basophils Absolute 0.1 0.0 - 0.1 K/uL  IBC panel  Result Value Ref Range   Iron 158 (H) 42 - 145 ug/dL   Transferrin 536.6 440.3 - 360.0 mg/dL   Saturation Ratios 47.4 20.0 - 50.0 %   TIBC 341.6 250.0 - 450.0 mcg/dL  Ferritin  Result Value Ref Range   Ferritin 32.4 10.0 - 291.0 ng/mL  Vitamin B12  Result Value Ref Range   Vitamin B-12 394 211 - 911 pg/mL  Comprehensive metabolic panel  Result Value Ref Range   Sodium 139 135 - 145 mEq/L   Potassium 4.3 3.5 - 5.1 mEq/L   Chloride 105 96 - 112 mEq/L   CO2 26 19 - 32 mEq/L   Glucose, Bld 81 70 - 99 mg/dL   BUN 17 6 - 23 mg/dL   Creatinine, Ser 2.59 0.40 - 1.20 mg/dL   Total Bilirubin 1.7 (H) 0.2 - 1.2 mg/dL   Alkaline Phosphatase 26 (L) 39 - 117 U/L   AST 18 0 - 37 U/L   ALT 17 0 - 35 U/L   Total Protein 7.0 6.0 - 8.3 g/dL   Albumin 4.4 3.5 -  5.2 g/dL   GFR 56.38 >75.64 mL/min   Calcium 9.7 8.4 - 10.5 mg/dL  Lipid panel  Result Value Ref Range   Cholesterol 184 0 - 200 mg/dL   Triglycerides 332.9 0.0 - 149.0 mg/dL   HDL 51.88 >41.66 mg/dL   VLDL 06.3 0.0 - 01.6 mg/dL   LDL Cholesterol 010 (H) 0 - 99 mg/dL   Total CHOL/HDL Ratio 4    NonHDL 141.98       10/14/2022    4:03 PM 08/22/2019    2:26 PM 11/23/2017    2:08 PM 09/21/2016    1:42 PM 09/21/2016    1:27 PM  Depression screen PHQ 2/9  Decreased Interest 0 0  0 0  Down, Depressed, Hopeless 0 0  0 0  PHQ - 2 Score 0 0  0 0  Altered sleeping  0  0 0  Tired, decreased energy  0  0 0  Change in appetite  0  0 0  Feeling bad or failure about yourself   0  0 0  Trouble concentrating  0  0 0  Moving slowly or fidgety/restless  0  0 0  Suicidal thoughts  0  0 0  PHQ-9 Score  0  0 0     Information is confidential and restricted.  Go to Review Flowsheets to unlock data.   Assessment & Plan:   Problem List Items Addressed This Visit     Health maintenance examination - Primary (Chronic)    Preventative protocols reviewed and updated unless pt declined. Discussed healthy diet and lifestyle.       Sicca syndrome (HCC)    Stable period. Recent inflammatory markers were normal.       Non-celiac gluten sensitivity   Abdominal discomfort    Anticipate multifactorial - celiac sensitivity, lactose intolerance.  Possible IBS given marked intolerance to barley.  Continue probiotics. Consider levsin vs IBgard trial, consider low-FODMAP diet.  Consider GI eval for SIBO as we don't do carbohydrate breath test here.       Hyperlipidemia    Cholesterol levels stable off treatment.       Iron deficiency    Iron levels stable with iron in prenatal MVI and OCP.       Low serum alkaline phosphatase    Low alk phos persists, now with elevated total bili but other LFTs stable - consider hypothyroidism, pernicious anemia, zinc deficiency. Consider GGT.       B12  deficiency    B12 levels stable with b12 in prenatal vitamin - continue.       Nephrolithiasis    H/o this, no recent symptoms.       Microhematuria    Recent UA with tr Norman - will repeat in a few weeks. No symptoms.       Relevant Orders   Urinalysis, Routine w reflex microscopic     No orders of the defined types were placed in this encounter.   Orders Placed This Encounter  Procedures   Urinalysis, Routine w reflex microscopic    Standing Status:   Future    Standing Expiration Date:   10/15/2023    Patient Instructions  Schedule lab visit in 2-3 weeks to repeat urinalysis.  Good to see you today Return as needed or in 1-2 year for next physical.   Follow up plan: Return in about 1 year (around 10/14/2023) for annual exam, prior fasting for Norman work.  Eustaquio Boyden, MD

## 2022-10-20 ENCOUNTER — Encounter: Payer: 59 | Admitting: Family Medicine

## 2022-10-28 ENCOUNTER — Other Ambulatory Visit (INDEPENDENT_AMBULATORY_CARE_PROVIDER_SITE_OTHER): Payer: 59

## 2022-10-28 DIAGNOSIS — R3129 Other microscopic hematuria: Secondary | ICD-10-CM

## 2022-10-28 LAB — URINALYSIS, ROUTINE W REFLEX MICROSCOPIC
Bilirubin Urine: NEGATIVE
Hgb urine dipstick: NEGATIVE
Ketones, ur: NEGATIVE
Leukocytes,Ua: NEGATIVE
Nitrite: NEGATIVE
Specific Gravity, Urine: 1.01 (ref 1.000–1.030)
Total Protein, Urine: NEGATIVE
Urine Glucose: NEGATIVE
Urobilinogen, UA: 0.2 (ref 0.0–1.0)
pH: 7 (ref 5.0–8.0)

## 2022-12-23 ENCOUNTER — Other Ambulatory Visit: Payer: Self-pay

## 2022-12-25 ENCOUNTER — Encounter: Payer: Self-pay | Admitting: Family Medicine

## 2022-12-25 ENCOUNTER — Other Ambulatory Visit (HOSPITAL_BASED_OUTPATIENT_CLINIC_OR_DEPARTMENT_OTHER): Payer: Self-pay

## 2022-12-25 DIAGNOSIS — R109 Unspecified abdominal pain: Secondary | ICD-10-CM

## 2022-12-25 DIAGNOSIS — K9041 Non-celiac gluten sensitivity: Secondary | ICD-10-CM

## 2022-12-25 MED ORDER — INFLUENZA VIRUS VACC SPLIT PF (FLUZONE) 0.5 ML IM SUSY
0.5000 mL | PREFILLED_SYRINGE | Freq: Once | INTRAMUSCULAR | 0 refills | Status: AC
  Filled 2022-12-25: qty 0.5, 1d supply, fill #0

## 2023-01-04 ENCOUNTER — Encounter: Payer: Self-pay | Admitting: Internal Medicine

## 2023-01-04 ENCOUNTER — Other Ambulatory Visit (INDEPENDENT_AMBULATORY_CARE_PROVIDER_SITE_OTHER): Payer: 59

## 2023-01-04 ENCOUNTER — Other Ambulatory Visit (HOSPITAL_COMMUNITY): Payer: Self-pay

## 2023-01-04 ENCOUNTER — Ambulatory Visit: Payer: 59 | Admitting: Internal Medicine

## 2023-01-04 VITALS — BP 102/70 | HR 84 | Ht 67.5 in | Wt 153.0 lb

## 2023-01-04 DIAGNOSIS — R17 Unspecified jaundice: Secondary | ICD-10-CM

## 2023-01-04 DIAGNOSIS — R197 Diarrhea, unspecified: Secondary | ICD-10-CM | POA: Diagnosis not present

## 2023-01-04 DIAGNOSIS — Z8639 Personal history of other endocrine, nutritional and metabolic disease: Secondary | ICD-10-CM | POA: Diagnosis not present

## 2023-01-04 DIAGNOSIS — R1030 Lower abdominal pain, unspecified: Secondary | ICD-10-CM

## 2023-01-04 DIAGNOSIS — E538 Deficiency of other specified B group vitamins: Secondary | ICD-10-CM

## 2023-01-04 LAB — HEPATIC FUNCTION PANEL
ALT: 20 U/L (ref 0–35)
AST: 18 U/L (ref 0–37)
Albumin: 4.6 g/dL (ref 3.5–5.2)
Alkaline Phosphatase: 28 U/L — ABNORMAL LOW (ref 39–117)
Bilirubin, Direct: 0.2 mg/dL (ref 0.0–0.3)
Total Bilirubin: 1.2 mg/dL (ref 0.2–1.2)
Total Protein: 7.4 g/dL (ref 6.0–8.3)

## 2023-01-04 MED ORDER — DICYCLOMINE HCL 10 MG PO CAPS
10.0000 mg | ORAL_CAPSULE | Freq: Three times a day (TID) | ORAL | 0 refills | Status: AC | PRN
  Filled 2023-01-04: qty 90, 30d supply, fill #0

## 2023-01-04 NOTE — Patient Instructions (Signed)
Your provider has requested that you go to the basement level for lab work before leaving today. Press "B" on the elevator. The lab is located at the first door on the left as you exit the elevator.  You have been scheduled for an abdominal ultrasound at [facility]iology (1st floor of hospital) on 01/07/23 at 9: am. Please arrive 8:3minutes prior to your appointment for registration. Make certain not to have anything to eat or drink after midnight. Should you need to reschedule your appointment, please contact radiology at [phone]. This test typically takes about 30 minutes to perform.   You have been scheduled for an endoscopy. Please follow written instructions given to you at your visit today.  If you use inhalers (even only as needed), please bring them with you on the day of your procedure.  If you take any of the following medications, they will need to be adjusted prior to your procedure:   DO NOT TAKE 7 DAYS PRIOR TO TEST- Trulicity (dulaglutide) Ozempic, Wegovy (semaglutide) Mounjaro (tirzepatide) Bydureon Bcise (exanatide extended release)  DO NOT TAKE 1 DAY PRIOR TO YOUR TEST Rybelsus (semaglutide) Adlyxin (lixisenatide) Victoza (liraglutide) Byetta (exanatide) ___________________________________________________________________________    We have sent the following medications to your pharmacy for you to pick up at your convenience: Bentyl   If your blood pressure at your visit was 140/90 or greater, please contact your primary care physician to follow up on this.  _______________________________________________________  If you are age 67 or older, your body mass index should be between 23-30. Your Body mass index is 23.61 kg/m. If this is out of the aforementioned range listed, please consider follow up with your Primary Care Provider.  If you are age 46 or younger, your body mass index should be between 19-25. Your Body mass index is 23.61 kg/m. If this is  out of the aformentioned range listed, please consider follow up with your Primary Care Provider.   ________________________________________________________  The [facility] providers would like to encourage you to use [facility] to communicate with providers for non-urgent requests or questions.  Due to long hold times on the telephone, sending your provider a message by [facility] may be a faster and more efficient way to get a response.  Please allow 48 business hours for a response.  Please remember that this is for non-urgent requests.  _______________________________________________________  Thank you for entrusting me with your care and for choosing [facility], Dr. Eulah Pont

## 2023-01-04 NOTE — Progress Notes (Signed)
Chief Complaint: Ab pain  HPI : 37 year old female with history of sicca syndrome, migraines, and depression presents with ab pain and diarrhea  She has been having ab pain and diarrhea for years, which has been getting worse over time. Low FODMAP diet did not help when she tried it for several days. She was tested for celiac disease, which was negative.  She is having 1-3 BMs per day. Denies nocturnal stools. Denies blood in the stools but sometimes when she wipes with toilet paper. Endorse abdominal pain that is located in the lower abdomen and improves after having a BM. Barley seems to agitate her GI symptoms. Endorses bloating issues. She has never been tried on an antispasmodic. Antidiarrheal medication works for a GI bug but she does not take it regularly. She has felt better off of gluten in the past. Endorses nausea. Denies vomiting. Denies dysphagia. Endorses occasional chest burning and regurgitation. Denies prior EGD or colonoscopy. Denies family history of GI issues.  She has a history of vitamin B12 and vitamin deficiency.  Patient works as a [employer]   Past Medical History:  Diagnosis Date   Dizziness    "goes along w/my migraines"   Family history of adverse reaction to anesthesia    "Mom is overweight, has sleep apnea; has breathing issues w/anesthesia"   History of chicken pox    History of depression    Ludwig's angina 05/30/2015   Lumbar vertebral fracture (HCC) 2004   "sports related; L5; no OR"   Migraine headache    "monthly even w/my RX" (05/31/2015)   Mononucleosis dx'd 05/2015   Pneumonia ~ 2010   Sicca syndrome (HCC) 10/04/2015   Sicca syndrome with polyarthralgia. Established with Dr Nickola Major rheum - glandular without systemic manifestations - continue topical treatment, no need for systemic therapy at this time. ANA neg, ESR 2, CRP 0.6, anti CCP 5 (10/2015)    Spondylolysis of lumbosacral region 2004   chronic R L5   Thyroid nodule 01/23/2016    Right lower thyroid gland 6 mm nodule by CT (05/2015)     Past Surgical History:  Procedure Laterality Date   brain MRI  11/2013   WNL, no demyelinating disease   DILATION AND CURETTAGE OF UTERUS N/A 09/27/2017   Procedure: DILATATION AND CURETTAGE with ultrasound guidance;  Surgeon: Marcelle Overlie, MD;  Location: [facility];  Service: Gynecology;  Laterality: N/A;   DILATION AND CURETTAGE OF UTERUS  2019   evoked brainstem auditory response  2014   WNL ([facility]NA)   WISDOM TOOTH EXTRACTION  2008   Family History  Problem Relation Age of Onset   Hypertension Mother    Cancer Mother 59       breast   Alzheimer's disease Maternal Uncle 42       early onset   Cancer Paternal Uncle        prostate   Cancer Paternal Grandfather        prostate   Stroke Father 16       unclear cause - small vessel disease   Hypertension Father    Stroke Paternal Grandmother 21       multiple    Stroke Paternal Uncle 28   CAD Maternal Grandmother 87   Diabetes Paternal Uncle    Social History   Tobacco Use   Smoking status: Never   Smokeless tobacco: Never  Vaping Use   Vaping status: Never Used  Substance Use Topics   Alcohol  use: No    Alcohol/week: 0.0 standard drinks of alcohol    Comment: occasionally   Drug use: No   Current Outpatient Medications  Medication Sig Dispense Refill   Cyanocobalamin (B-12) 1000 MCG SUBL Place 1 tablet under the tongue daily.     Norethindrone Acetate-Ethinyl Estrad-FE (BLISOVI 24 FE) 1-20 MG-MCG(24) tablet Take 1 tablet by mouth daily. 84 tablet 4   Prenatal Vit-Fe Fumarate-FA (PRENATAL VITAMIN PLUS LOW IRON) 27-1 MG TABS Take 1 tablet by mouth daily. 30 tablet    No current facility-administered medications for this visit.   No Known Allergies   Review of Systems: All systems reviewed and negative except where noted in HPI.   Physical Exam: BP 102/70   Pulse 84   Ht 5' 7.5" (1.715 m)   Wt 153 lb (69.4 kg)   BMI 23.61 kg/m  Constitutional:  Pleasant,well-developed, female in no acute distress. HEENT: Normocephalic and atraumatic. Conjunctivae are normal. No scleral icterus. Cardiovascular: Normal rate, regular rhythm.  Pulmonary/chest: Effort normal and breath sounds normal. No wheezing, rales or rhonchi. Abdominal: Soft, nondistended, nontender. Bowel sounds active throughout. There are no masses palpable. No hepatomegaly. Extremities: No edema Neurological: Alert and oriented to person place and time. Skin: Skin is warm and dry. No rashes noted. Psychiatric: Normal mood and affect. Behavior is normal.  Labs 10/2021: CBC normal.  CMP unremarkable.  Vit B12 level low at 208.  TSH normal.  Labs 12/2021: Celiac panel was negative.  Intrinsic factor antibody was negative.  Vitamin B12 normal.  Ferritin 10.  Iron/TIBC unremarkable.  Labs 09/2022: CMP with elevated T bili 1.7. CBC nml. Ferritin 32.4.  Vitamin B12 normal.  CRP normal.  ESR normal.  Hepatitis C antibody negative.  ASSESSMENT AND PLAN: Diarrhea Lower abdominal pain Elevated T bili History of vitamin B12 and iron deficiency Patient presents with longstanding diarrhea and lower abdominal pain that been present for years and worsening over time.  She has a history of vitamin B12 and iron deficiency.  Her recent total bilirubin was mildly elevated so we will plan to recheck her LFTs today with fractionation to be able to better determine the source of her elevated bilirubin.  Will check LDH and haptoglobin to rule out hemolysis.  Will also plan for a right upper quadrant ultrasound to look for any bile duct obstruction.  In the meantime we will have her try an antispasmodic to see if this helps with her symptoms.  Will also plan for an EGD to look for any signs of malabsorption (prior celiac panel and intrinsic factor were negative but will look for any sources of inflammation/autoimmune disease) - Check LFTs, LDH, haptoglobin - Trial of dicyclomine 10 mg TID PRN - Check RUQ  U/S - EGD [facility] - RTC 3 months  Eulah Pont, MD  I spent 52 minutes of time, including in depth chart review, independent review of results as outlined above, communicating results with the patient directly, face-to-face time with the patient, coordinating care, ordering studies and medications as appropriate, and documentation.

## 2023-01-05 LAB — LACTATE DEHYDROGENASE: LDH: 136 U/L (ref 100–200)

## 2023-01-05 LAB — EXTRA SPECIMEN

## 2023-01-05 LAB — HAPTOGLOBIN: Haptoglobin: 98 mg/dL (ref 43–212)

## 2023-01-06 NOTE — Addendum Note (Signed)
Addended by: Dennison Mascot on: 01/06/2023 09:09 AM   Modules accepted: Orders

## 2023-01-07 ENCOUNTER — Ambulatory Visit (HOSPITAL_COMMUNITY)
Admission: RE | Admit: 2023-01-07 | Discharge: 2023-01-07 | Disposition: A | Discharge status: Home / Self Care | Payer: 59 | Source: Ambulatory Visit | Attending: Internal Medicine | Admitting: Internal Medicine

## 2023-01-07 ENCOUNTER — Other Ambulatory Visit: Payer: 59

## 2023-01-07 DIAGNOSIS — R197 Diarrhea, unspecified: Secondary | ICD-10-CM | POA: Insufficient documentation

## 2023-01-07 DIAGNOSIS — K828 Other specified diseases of gallbladder: Secondary | ICD-10-CM | POA: Diagnosis not present

## 2023-01-07 DIAGNOSIS — R17 Unspecified jaundice: Secondary | ICD-10-CM | POA: Diagnosis not present

## 2023-01-07 DIAGNOSIS — E538 Deficiency of other specified B group vitamins: Secondary | ICD-10-CM | POA: Diagnosis not present

## 2023-01-07 DIAGNOSIS — Z8639 Personal history of other endocrine, nutritional and metabolic disease: Secondary | ICD-10-CM | POA: Diagnosis not present

## 2023-01-07 DIAGNOSIS — R1030 Lower abdominal pain, unspecified: Secondary | ICD-10-CM | POA: Insufficient documentation

## 2023-01-09 LAB — O215-IGE ALPHA-GAL: O215-IgE Alpha-Gal: <0.1 kU/L

## 2023-01-13 ENCOUNTER — Ambulatory Visit: Payer: 59 | Admitting: Internal Medicine

## 2023-01-13 ENCOUNTER — Encounter: Payer: Self-pay | Admitting: Internal Medicine

## 2023-01-13 VITALS — BP 113/77 | HR 76 | Temp 97.3°F | Resp 21 | Ht 67.5 in | Wt 153.0 lb

## 2023-01-13 DIAGNOSIS — K2950 Unspecified chronic gastritis without bleeding: Secondary | ICD-10-CM | POA: Diagnosis not present

## 2023-01-13 DIAGNOSIS — Z8639 Personal history of other endocrine, nutritional and metabolic disease: Secondary | ICD-10-CM

## 2023-01-13 DIAGNOSIS — K3189 Other diseases of stomach and duodenum: Secondary | ICD-10-CM | POA: Diagnosis not present

## 2023-01-13 DIAGNOSIS — E538 Deficiency of other specified B group vitamins: Secondary | ICD-10-CM

## 2023-01-13 DIAGNOSIS — D509 Iron deficiency anemia, unspecified: Secondary | ICD-10-CM | POA: Diagnosis not present

## 2023-01-13 DIAGNOSIS — R197 Diarrhea, unspecified: Secondary | ICD-10-CM

## 2023-01-13 MED ORDER — SODIUM CHLORIDE 0.9 % IV SOLN: 500.0000 mL | Freq: Once | INTRAVENOUS | Status: DC

## 2023-01-13 NOTE — Op Note (Signed)
[facility] Patient Name: Samantha Norman Procedure Date: 01/13/2023 1:58 PM MRN: [id number] Endoscopist: Particia Lather , , [id number] Age: 37 Referring MD:  Date of Birth: 02/22/1986 Gender: Female Account #: [id number] Procedure:                Upper GI endoscopy Indications:              Unexplained iron deficiency anemia, Diarrhea,                            history of vitamin B12 deficiency Medicines:                Monitored Anesthesia Care Procedure:                Pre-Anesthesia Assessment:                           - Prior to the procedure, a History and Physical                            was performed, and patient medications and                            allergies were reviewed. The patient's tolerance of                            previous anesthesia was also reviewed. The risks                            and benefits of the procedure and the sedation                            options and risks were discussed with the patient.                            All questions were answered, and informed consent                            was obtained. Prior Anticoagulants: The patient has                            taken no anticoagulant or antiplatelet agents                            except for aspirin. ASA Grade Assessment: II - A                            patient with mild systemic disease. After reviewing                            the risks and benefits, the patient was deemed in                            satisfactory condition to undergo the procedure.  After obtaining informed consent, the endoscope was                            passed under direct vision. Throughout the                            procedure, the patient's blood pressure, pulse, and                            oxygen saturations were monitored continuously. The                            Olympus Scope [id number] was introduced through the                             mouth, and advanced to the second part of duodenum.                            The upper GI endoscopy was accomplished without                            difficulty. The patient tolerated the procedure                            well. Scope In: Scope Out: Findings:                 The examined esophagus was normal.                           A few localized erosions with no bleeding and no                            stigmata of recent bleeding were found in the                            gastric antrum. Biopsies were taken with a cold                            forceps for histology.                           The examined duodenum was normal. Biopsies were                            taken with a cold forceps for histology. Complications:            No immediate complications. Estimated Blood Loss:     Estimated blood loss was minimal. Impression:               - Normal esophagus.                           - Erosive gastropathy with no bleeding and no  stigmata of recent bleeding. Biopsied.                           - Normal examined duodenum. Biopsied. Recommendation:           - Discharge patient to home (with escort).                           - Await pathology results.                           - Return to GI clinic in 2-3 months.                           - The findings and recommendations were discussed                            with the patient. Dr Particia Lather "[id number] Leonides Schanz,  01/13/2023 2:18:24 PM

## 2023-01-13 NOTE — Progress Notes (Signed)
GASTROENTEROLOGY PROCEDURE H&P NOTE   Primary Care Physician: Eustaquio Boyden, MD    Reason for Procedure:   Diarrhea, history of vitamin B12 and iron deficiency  Plan:    EGD  Patient is appropriate for endoscopic procedure(s) in the ambulatory ([facility]) setting.  The nature of the procedure, as well as the risks, benefits, and alternatives were carefully and thoroughly reviewed with the patient. Ample time for discussion and questions allowed. The patient understood, was satisfied, and agreed to proceed.     HPI: Samantha Norman is a 37 y.o. female who presents for EGD for evaluation of diarrhea and history of vitamin B12 and iron deficiency .  Patient was most recently seen in the Gastroenterology Clinic on 01/04/23.  No interval change in medical history since that appointment. Please refer to that note for full details regarding GI history and clinical presentation.   Past Medical History:  Diagnosis Date   Dizziness    "goes along w/my migraines"   Family history of adverse reaction to anesthesia    "Mom is overweight, has sleep apnea; has breathing issues w/anesthesia"   History of chicken pox    History of depression    Ludwig's angina 05/30/2015   Lumbar vertebral fracture (HCC) 2004   "sports related; L5; no OR"   Migraine headache    "monthly even w/my RX" (05/31/2015)   Mononucleosis dx'd 05/2015   Pneumonia ~ 2010   Sicca syndrome (HCC) 10/04/2015   Sicca syndrome with polyarthralgia. Established with Dr Nickola Major rheum - glandular without systemic manifestations - continue topical treatment, no need for systemic therapy at this time. ANA neg, ESR 2, CRP 0.6, anti CCP 5 (10/2015)    Spondylolysis of lumbosacral region 2004   chronic R L5   Thyroid nodule 01/23/2016   Right lower thyroid gland 6 mm nodule by CT (05/2015)    Past Surgical History:  Procedure Laterality Date   brain MRI  11/2013   WNL, no demyelinating disease   DILATION AND CURETTAGE OF UTERUS N/A  09/27/2017   Procedure: DILATATION AND CURETTAGE with ultrasound guidance;  Surgeon: Marcelle Overlie, MD;  Location: [facility];  Service: Gynecology;  Laterality: N/A;   DILATION AND CURETTAGE OF UTERUS  2019   evoked brainstem auditory response  2014   WNL ([facility]NA)   WISDOM TOOTH EXTRACTION  2008    Prior to Admission medications   Medication Sig Start Date End Date Taking? Authorizing Provider  Cyanocobalamin (B-12) 1000 MCG SUBL Place 1 tablet under the tongue daily. 10/30/21   Eustaquio Boyden, MD  dicyclomine (BENTYL) 10 MG capsule Take 1 capsule (10 mg total) by mouth 3 (three) times daily as needed. 01/04/23 02/03/23  Imogene Burn, MD  Norethindrone Acetate-Ethinyl Estrad-FE (BLISOVI 24 FE) 1-20 MG-MCG(24) tablet Take 1 tablet by mouth daily. 02/11/22     Prenatal Vit-Fe Fumarate-FA (PRENATAL VITAMIN PLUS LOW IRON) 27-1 MG TABS Take 1 tablet by mouth daily. 01/20/22   Eustaquio Boyden, MD    Current Outpatient Medications  Medication Sig Dispense Refill   Cyanocobalamin (B-12) 1000 MCG SUBL Place 1 tablet under the tongue daily.     dicyclomine (BENTYL) 10 MG capsule Take 1 capsule (10 mg total) by mouth 3 (three) times daily as needed. 90 capsule 0   Norethindrone Acetate-Ethinyl Estrad-FE (BLISOVI 24 FE) 1-20 MG-MCG(24) tablet Take 1 tablet by mouth daily. 84 tablet 4   Prenatal Vit-Fe Fumarate-FA (PRENATAL VITAMIN PLUS LOW IRON) 27-1 MG TABS Take 1 tablet by mouth  daily. 30 tablet    Current Facility-Administered Medications  Medication Dose Route Frequency Provider Last Rate Last Admin   0.9 %  sodium chloride infusion  500 mL Intravenous Once Imogene Burn, MD        Allergies as of 01/13/2023   (No Known Allergies)    Family History  Problem Relation Age of Onset   Hypertension Mother    Cancer Mother 70       breast   Alzheimer's disease Maternal Uncle 36       early onset   Cancer Paternal Uncle        prostate   Cancer Paternal Grandfather        prostate   Stroke  Father 26       unclear cause - small vessel disease   Hypertension Father    Stroke Paternal Grandmother 27       multiple    Stroke Paternal Uncle 87   CAD Maternal Grandmother 47   Diabetes Paternal Uncle     Social History   Socioeconomic History   Marital status: Married    Spouse name: Not on file   Number of children: 0   Years of education: Pharmacy   Highest education level: Not on file  Occupational History   Occupation: Consulting civil engineer   Occupation: pharmacy  Tobacco Use   Smoking status: Never   Smokeless tobacco: Never  Vaping Use   Vaping status: Never Used  Substance and Sexual Activity   Alcohol use: No    Alcohol/week: 0.0 standard drinks of alcohol    Comment: occasionally   Drug use: No   Sexual activity: Yes    Birth control/protection: Pill  Other Topics Concern   Not on file  Social History Narrative   Married. 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   Edu: PharmD    Activity: running, gym, 30 min cardio    Diet: good water, fruits/vegetables regularly    Social Determinants of Corporate investment banker Strain: Not on file  Food Insecurity: Not on file  Transportation Needs: Not on file  Physical Activity: Not on file  Stress: Not on file  Social Connections: Not on file  Intimate Partner Violence: Not on file    Physical Exam: Vital signs in last 24 hours: BP 110/75   Pulse 68   Temp (!) 97.3 F (36.3 C)   Ht 5' 7.5" (1.715 m)   Wt 153 lb (69.4 kg)   SpO2 98%   BMI 23.61 kg/m  GEN: NAD EYE: Sclerae anicteric ENT: MMM CV: Non-tachycardic Pulm: No increased WOB GI: Soft NEURO:  Alert & Oriented   Eulah Pont, MD [facility]   01/13/2023 1:51 PM

## 2023-01-13 NOTE — Progress Notes (Signed)
Pt's states no medical or surgical changes since previsit or office visit. 

## 2023-01-13 NOTE — Patient Instructions (Signed)
Resume previous diet Continue present medications Await pathology results  YOU HAD AN ENDOSCOPIC PROCEDURE TODAY AT THE [facility]:   Refer to the procedure report that was given to you for any specific questions about what was found during the examination.  If the procedure report does not answer your questions, please call your gastroenterologist to clarify.  If you requested that your care partner not be given the details of your procedure findings, then the procedure report has been included in a sealed envelope for you to review at your convenience later.  YOU SHOULD EXPECT: Some feelings of bloating in the abdomen. Passage of more gas than usual.  Walking can help get rid of the air that was put into your GI tract during the procedure and reduce the bloating. If you had a lower endoscopy (such as a colonoscopy or flexible sigmoidoscopy) you may notice spotting of blood in your stool or on the toilet paper. If you underwent a bowel prep for your procedure, you may not have a normal bowel movement for a few days.  Please Note:  You might notice some irritation and congestion in your nose or some drainage.  This is from the oxygen used during your procedure.  There is no need for concern and it should clear up in a day or so.  SYMPTOMS TO REPORT IMMEDIATELY:  Following upper endoscopy (EGD)  Vomiting of blood or coffee ground material  New chest pain or pain under the shoulder blades  Painful or persistently difficult swallowing  New shortness of breath  Fever of 100F or higher  Black, tarry-looking stools  For urgent or emergent issues, a gastroenterologist can be reached at any hour by calling [phone]. Do not use MyChart messaging for urgent concerns.    DIET:  We do recommend a small meal at first, but then you may proceed to your regular diet.  Drink plenty of fluids but you should avoid alcoholic beverages for 24 hours.  ACTIVITY:  You should plan to take it  easy for the rest of today and you should NOT DRIVE or use heavy machinery until tomorrow (because of the sedation medicines used during the test).    FOLLOW UP: Our staff will call the number listed on your records the next business day following your procedure.  We will call around 7:15- 8:00 am to check on you and address any questions or concerns that you may have regarding the information given to you following your procedure. If we do not reach you, we will leave a message.     If any biopsies were taken you will be contacted by phone or by letter within the next 1-3 weeks.  Please call us at [phone] if you have not heard about the biopsies in 3 weeks.    SIGNATURES/CONFIDENTIALITY: You and/or your care partner have signed paperwork which will be entered into your electronic medical record.  These signatures attest to the fact that that the information above on your After Visit Summary has been reviewed and is understood.  Full responsibility of the confidentiality of this discharge information lies with you and/or your care-partner.

## 2023-01-13 NOTE — Progress Notes (Signed)
Called to room to assist during endoscopic procedure.  Patient ID and intended procedure confirmed with present staff. Received instructions for my participation in the procedure from the performing physician.  

## 2023-01-13 NOTE — Progress Notes (Signed)
Vss nad trans to pacu 

## 2023-01-14 ENCOUNTER — Other Ambulatory Visit (HOSPITAL_COMMUNITY): Payer: Self-pay

## 2023-01-14 ENCOUNTER — Telehealth: Payer: Self-pay

## 2023-01-14 NOTE — Telephone Encounter (Signed)
  Follow up Call-     01/13/2023    1:48 PM  Call back number  Post procedure Call Back phone  # [phone]  Permission to leave phone message Yes     Patient questions:  Do you have a fever, pain , or abdominal swelling? No. Pain Score  0 *  Have you tolerated food without any problems? Yes.    Have you been able to return to your normal activities? Yes.    Do you have any questions about your discharge instructions: Diet   No. Medications  No. Follow up visit  No.  Do you have questions or concerns about your Care? Yes.    Patient states she has returned to her regular diet without difficulty, no complaints, except that she noted some bright red blood per rectum when she had a bowel movement this am, has not noted any additional blood since then. Patient states she does have a history of hemorrhoids. Advised patient to watch for any additional bleeding or symptoms and call us immediately if she notes this. Dr. Leonides Schanz is here performing procedures today and this note is being routed to her for additional advisement. Patient verbalizes understanding.  Actions: * If pain score is 4 or above: No action needed, pain <4.

## 2023-01-18 LAB — SURGICAL PATHOLOGY

## 2023-01-19 ENCOUNTER — Encounter: Payer: Self-pay | Admitting: Internal Medicine

## 2023-02-04 ENCOUNTER — Other Ambulatory Visit (HOSPITAL_BASED_OUTPATIENT_CLINIC_OR_DEPARTMENT_OTHER): Payer: Self-pay

## 2023-02-16 ENCOUNTER — Other Ambulatory Visit (HOSPITAL_COMMUNITY): Payer: Self-pay

## 2023-02-16 DIAGNOSIS — Z3040 Encounter for surveillance of contraceptives, unspecified: Secondary | ICD-10-CM | POA: Diagnosis not present

## 2023-02-16 DIAGNOSIS — Z6823 Body mass index (BMI) 23.0-23.9, adult: Secondary | ICD-10-CM | POA: Diagnosis not present

## 2023-02-16 DIAGNOSIS — Z01419 Encounter for gynecological examination (general) (routine) without abnormal findings: Secondary | ICD-10-CM | POA: Diagnosis not present

## 2023-02-16 MED ORDER — BLISOVI 24 FE 1-20 MG-MCG(24) PO TABS
1.0000 | ORAL_TABLET | Freq: Every day | ORAL | 4 refills | Status: DC
  Filled 2023-02-16 – 2023-03-13 (×2): qty 84, 84d supply, fill #0
  Filled 2023-06-04: qty 84, 84d supply, fill #1
  Filled 2023-08-27: qty 84, 84d supply, fill #2
  Filled 2023-11-17: qty 84, 84d supply, fill #3
  Filled 2024-02-10: qty 84, 84d supply, fill #4

## 2023-03-13 ENCOUNTER — Other Ambulatory Visit (HOSPITAL_COMMUNITY): Payer: Self-pay

## 2023-03-13 ENCOUNTER — Other Ambulatory Visit: Payer: Self-pay

## 2023-05-05 ENCOUNTER — Ambulatory Visit: Payer: 59 | Admitting: Internal Medicine

## 2023-06-04 ENCOUNTER — Other Ambulatory Visit (HOSPITAL_COMMUNITY): Payer: Self-pay

## 2023-07-07 ENCOUNTER — Other Ambulatory Visit (HOSPITAL_COMMUNITY): Payer: Self-pay

## 2023-07-07 ENCOUNTER — Encounter: Payer: Self-pay | Admitting: Internal Medicine

## 2023-07-07 ENCOUNTER — Ambulatory Visit: Payer: 59 | Admitting: Internal Medicine

## 2023-07-07 VITALS — BP 124/70 | HR 79 | Ht 67.5 in | Wt 154.0 lb

## 2023-07-07 DIAGNOSIS — R197 Diarrhea, unspecified: Secondary | ICD-10-CM | POA: Diagnosis not present

## 2023-07-07 DIAGNOSIS — R1030 Lower abdominal pain, unspecified: Secondary | ICD-10-CM

## 2023-07-07 DIAGNOSIS — K828 Other specified diseases of gallbladder: Secondary | ICD-10-CM

## 2023-07-07 DIAGNOSIS — R140 Abdominal distension (gaseous): Secondary | ICD-10-CM

## 2023-07-07 DIAGNOSIS — Z8639 Personal history of other endocrine, nutritional and metabolic disease: Secondary | ICD-10-CM | POA: Diagnosis not present

## 2023-07-07 DIAGNOSIS — K31A0 Gastric intestinal metaplasia, unspecified: Secondary | ICD-10-CM | POA: Diagnosis not present

## 2023-07-07 MED ORDER — COLESTIPOL HCL 1 G PO TABS
2.0000 g | ORAL_TABLET | Freq: Every day | ORAL | 1 refills | Status: AC
  Filled 2023-07-07: qty 180, 90d supply, fill #0

## 2023-07-07 MED ORDER — OMEPRAZOLE 40 MG PO CPDR
40.0000 mg | DELAYED_RELEASE_CAPSULE | Freq: Every day | ORAL | 3 refills | Status: AC
  Filled 2023-07-07: qty 90, 90d supply, fill #0

## 2023-07-07 NOTE — Progress Notes (Signed)
 Chief Complaint: Ab pain  HPI : 38 year old female with history of sicca syndrome, migraines, and depression presents for follow-up of ab pain and diarrhea  Low FODMAP diet did not help when she tried it for several days. She was tested for celiac disease, which was negative. She has a history of vitamin B12 and iron deficiency.  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 Her symptoms are not as bad. She is still having 1-3 BM per day. The Bentyl did not really help with her symptoms. She has avoided NSAIDs. She realized that she is possibly sensitive to dairy. She has way more pain when eating fatty foods. She did try an OTC PPI that might have helped somewhat with her abdominal pain. Endorses a lot of bloating   Past Medical History:  Diagnosis Date   Dizziness    "goes along w/my migraines"   Family history of adverse reaction to anesthesia    "Mom is overweight, has sleep apnea; has breathing issues w/anesthesia"   History of chicken pox    History of depression    Ludwig's angina 05/30/2015   Lumbar vertebral fracture (HCC) 2004   "sports related; L5; no OR"   Migraine headache    "monthly even w/my RX" (05/31/2015)   Mononucleosis dx'd 05/2015   Pneumonia ~ 2010   Sicca syndrome (HCC) 10/04/2015   Sicca syndrome with polyarthralgia. Established with Dr Nickola Major rheum - glandular without systemic manifestations - continue topical treatment, no need for systemic therapy at this time. ANA neg, ESR 2, CRP 0.6, anti CCP 5 (10/2015)    Spondylolysis of lumbosacral region 2004   chronic R L5   Thyroid nodule 01/23/2016   Right lower thyroid gland 6 mm nodule by CT (05/2015)     Past Surgical History:  Procedure Laterality Date   brain MRI  11/2013   WNL, no demyelinating disease   DILATION AND CURETTAGE OF UTERUS N/A 09/27/2017   Procedure: DILATATION AND CURETTAGE with ultrasound guidance;  Surgeon: Marcelle Overlie, MD;  Location: [facility];  Service: Gynecology;  Laterality: N/A;   DILATION AND CURETTAGE OF UTERUS  2019   evoked brainstem auditory response  2014   WNL (GNA)   WISDOM TOOTH EXTRACTION  2008   Family History  Problem Relation Age of Onset   Hypertension Mother    Cancer Mother 70       breast   Stroke Father 31       unclear cause - small vessel disease   Hypertension Father    Alzheimer's disease Maternal Uncle 31       early onset   Cancer Paternal Uncle        prostate   Stroke Paternal Uncle 49   Diabetes Paternal Uncle    CAD Maternal Grandmother 40   Stroke Paternal Grandmother 65       multiple    Cancer Paternal Grandfather        prostate   Esophageal cancer Neg Hx    Colon cancer Neg Hx    Rectal cancer Neg Hx    Stomach cancer Neg Hx    Social History   Tobacco Use   Smoking status: Never   Smokeless tobacco: Never  Vaping Use   Vaping status: Never Used  Substance Use Topics   Alcohol use: No    Alcohol/week: 0.0 standard drinks of alcohol    Comment: occasionally  Drug use: No   Current Outpatient Medications  Medication Sig Dispense Refill   Cyanocobalamin (B-12) 1000 MCG SUBL Place 1 tablet under the tongue daily.     Norethindrone Acetate-Ethinyl Estrad-FE (BLISOVI 24 FE) 1-20 MG-MCG(24) tablet Take 1 tablet by mouth daily. 84 tablet 4   Prenatal Vit-Fe Fumarate-FA (PRENATAL VITAMIN PLUS LOW IRON) 27-1 MG TABS Take 1 tablet by mouth daily. 30 tablet    dicyclomine (BENTYL) 10 MG capsule Take 1 capsule (10 mg total) by mouth 3 (three) times daily as needed. (Patient not taking: Reported on 01/13/2023) 90 capsule 0   No current facility-administered medications for this visit.   No Known Allergies  Physical Exam: BP 124/70   Pulse 79   Ht 5' 7.5" (1.715 m)   Wt 154 lb (69.9 kg)   BMI 23.76 kg/m  Constitutional: Pleasant,well-developed, female in no acute distress. HEENT: Normocephalic and atraumatic. Conjunctivae are normal. No  scleral icterus. Cardiovascular: Normal rate, regular rhythm.  Pulmonary/chest: Effort normal and breath sounds normal. No wheezing, rales or rhonchi. Abdominal: Soft, nondistended, nontender but feels some extra pressure in her RUQ. Bowel sounds active throughout. There are no masses palpable. No hepatomegaly. Extremities: No edema Neurological: Alert and oriented to person place and time. Skin: Skin is warm and dry. No rashes noted. Psychiatric: Normal mood and affect. Behavior is normal.  Labs 10/2021: CBC normal.  CMP unremarkable.  Vit B12 level low at 208.  TSH normal.  Labs 12/2021: Celiac panel was negative.  Intrinsic factor antibody was negative.  Vitamin B12 normal.  Ferritin 10.  Iron/TIBC unremarkable.  Labs 09/2022: CMP with elevated T bili 1.7. CBC nml. Ferritin 32.4.  Vitamin B12 normal.  CRP normal.  ESR normal.  Hepatitis C antibody negative.  Labs 12/2022: LFTs nml. LDH nml. Haptoglobin nml.   Labs 12/2022: Alpha gal negative.  RUQ U/S 01/07/23: IMPRESSION: Mild gallbladder wall thickening without cholelithiasis or pericholecystic fluid. Negative sonographic Murphy's sign.  EGD 01/13/23: - Normal esophagus. - Erosive gastropathy with no bleeding and no stigmata of recent bleeding. Biopsied. - Normal examined duodenum. Biopsied. Path: 1. Surgical [P], duodenal :       -  DUODENAL MUCOSA WITH PROMINENT BRUNNER'S GLANDS AND VERY FOCAL FOVEOLAR       METAPLASIA SUGGESTIVE OF CHRONIC PEPTIC DUODENITIS.       2. Surgical [P], gastric :       -  ANTRAL-TYPE MUCOSA WITH MILD CHRONIC INACTIVE GASTRITIS AND FOCAL INTESTINAL       METAPLASIA, SUGGESTIVE OF ATROPHY.       -  SEPARATE FRAGMENTS OF OXYNTIC MUCOSA WITH NO SIGNIFICANT PATHOLOGY.       -  IHC for H pylori was negative  ASSESSMENT AND PLAN: Diarrhea Lower abdominal pain Bloating Gastric intestinal metaplasia History of vitamin B12 and iron deficiency Patient has had some slight improvement in her symptoms  after she stopped drinking black coffee.  Patient was noted to have some gastric erosions on her recent EGD and she did benefit from some over-the-counter Prilosec.  Thus we will start her on omeprazole 40 mg once a day to see if this helps further with her symptoms.  Patient does note that her symptoms seem to worsen significantly after eating fatty foods.  Her last right upper quadrant ultrasound did show some gallbladder wall thickening so we will plan for HIDA scan for further evaluation.  As a trial we will start her on some colestipol to see if this helps with her diarrhea  issues.  Will also plan for SIBO breath test as well. - Start omeprazole 40 mg QD - Start colestipol 2 g every day - SIBO breath test - HIDA scan - Next EGD due in 12/2023 for gastric mapping of GIM - RTC 3 months  Eulah Pont, MD  I spent 34 minutes of time, including in depth chart review, independent review of results as outlined above, communicating results with the patient directly, face-to-face time with the patient, coordinating care, ordering studies and medications as appropriate, and documentation.

## 2023-07-07 NOTE — Patient Instructions (Signed)
 You have been scheduled for a HIDA scan at [facility] (1st floor) on 07/12/23. Please arrive 30 minutes prior to your scheduled appointment at  1:00 pm. Make certain not to have anything to eat or drink at least 6 hours prior to your test. Should this appointment date or time not work well for you, please call radiology scheduling at [phone].  _____________________________________________________________________ hepatobiliary (HIDA) scan is an imaging procedure used to diagnose problems in the liver, gallbladder and bile ducts. In the HIDA scan, a radioactive chemical or tracer is injected into a vein in your arm. The tracer is handled by the liver like bile. Bile is a fluid produced and excreted by your liver that helps your digestive system break down fats in the foods you eat. Bile is stored in your gallbladder and the gallbladder releases the bile when you eat a meal. A special nuclear medicine scanner (gamma camera) tracks the flow of the tracer from your liver into your gallbladder and small intestine.  During your HIDA scan  You'll be asked to change into a hospital gown before your HIDA scan begins. Your health care team will position you on a table, usually on your back. The radioactive tracer is then injected into a vein in your arm.The tracer travels through your bloodstream to your liver, where it's taken up by the bile-producing cells. The radioactive tracer travels with the bile from your liver into your gallbladder and through your bile ducts to your small intestine.You may feel some pressure while the radioactive tracer is injected into your vein. As you lie on the table, a special gamma camera is positioned over your abdomen taking pictures of the tracer as it moves through your body. The gamma camera takes pictures continually for about an hour. You'll need to keep still during the HIDA scan. This can become uncomfortable, but you may find that you can lessen the discomfort by  taking deep breaths and thinking about other things. Tell your health care team if you're uncomfortable. The radiologist will watch on a computer the progress of the radioactive tracer through your body. The HIDA scan may be stopped when the radioactive tracer is seen in the gallbladder and enters your small intestine. This typically takes about an hour. In some cases extra imaging will be performed if original images aren't satisfactory, if morphine is given to help visualize the gallbladder or if the medication CCK is given to look at the contraction of the gallbladder. This test typically takes 2 hours to complete. ________________________________________________________________________ Bonita Quin have been given a testing kit to check for small intestine bacterial overgrowth (SIBO) which is completed by a company named [employer]. Make sure to return your test in the mail using the return mailing label given to you along with the kit. The test order, your demographic and insurance information have all already been sent to the company. [facility] will collect an upfront charge of $99.74 for commercial insurance plans and $209.74 if you are paying cash. Make sure to discuss with [facility] PRIOR to having the test to see if they have gotten information from your insurance company as to how much your testing will cost out of pocket, if any. Please contact [facility] at phone number [phone] to get instructions regarding how to perform the test as our office is unable to give specific testing instructions.  We have sent the following medications to your pharmacy for you to pick up at your convenience: Colestipol, Omeprazole   You are schedule for  a follow up visit on 10/06/23 at 8:30 am with Dr Leonides Schanz  If your blood pressure at your visit was 140/90 or greater, please contact your primary care physician to follow up on this.  _______________________________________________________  If  you are age 37 or older, your body mass index should be between 23-30. Your Body mass index is 23.76 kg/m. If this is out of the aforementioned range listed, please consider follow up with your Primary Care Provider.  If you are age 37 or younger, your body mass index should be between 19-25. Your Body mass index is 23.76 kg/m. If this is out of the aformentioned range listed, please consider follow up with your Primary Care Provider.   ________________________________________________________  The [place] [facility] providers would like to encourage you to use [facility] to communicate with providers for non-urgent requests or questions.  Due to long hold times on the telephone, sending your provider a message by [facility] may be a faster and more efficient way to get a response.  Please allow 48 business hours for a response.  Please remember that this is for non-urgent requests.  _______________________________________________________  Due to recent changes in healthcare laws, you may see the results of your imaging and laboratory studies on MyChart before your provider has had a chance to review them.  We understand that in some cases there may be results that are confusing or concerning to you. Not all laboratory results come back in the same time frame and the provider may be waiting for multiple results in order to interpret others.  Please give [place] 48 hours in order for your provider to thoroughly review all the results before contacting the office for clarification of your results.   Thank you for entrusting me with your care and for choosing [facility],  Dr. Eulah Pont

## 2023-07-09 DIAGNOSIS — R1030 Lower abdominal pain, unspecified: Secondary | ICD-10-CM | POA: Diagnosis not present

## 2023-07-09 DIAGNOSIS — R197 Diarrhea, unspecified: Secondary | ICD-10-CM | POA: Diagnosis not present

## 2023-07-09 DIAGNOSIS — K828 Other specified diseases of gallbladder: Secondary | ICD-10-CM | POA: Diagnosis not present

## 2023-07-12 ENCOUNTER — Encounter (HOSPITAL_COMMUNITY)

## 2023-07-16 ENCOUNTER — Ambulatory Visit (HOSPITAL_COMMUNITY)
Admission: RE | Admit: 2023-07-16 | Discharge: 2023-07-16 | Disposition: A | Discharge status: Home / Self Care | Source: Ambulatory Visit | Attending: Internal Medicine | Admitting: Internal Medicine

## 2023-07-16 DIAGNOSIS — K828 Other specified diseases of gallbladder: Secondary | ICD-10-CM | POA: Diagnosis not present

## 2023-07-16 DIAGNOSIS — R197 Diarrhea, unspecified: Secondary | ICD-10-CM | POA: Insufficient documentation

## 2023-07-16 DIAGNOSIS — R1011 Right upper quadrant pain: Secondary | ICD-10-CM | POA: Diagnosis not present

## 2023-07-16 DIAGNOSIS — R1030 Lower abdominal pain, unspecified: Secondary | ICD-10-CM | POA: Diagnosis not present

## 2023-07-16 MED ORDER — TECHNETIUM TC 99M MEBROFENIN IV KIT
5.0000 | PACK | Freq: Once | INTRAVENOUS | Status: AC
  Administered 2023-07-16: 5 via INTRAVENOUS

## 2023-07-19 ENCOUNTER — Encounter: Payer: Self-pay | Admitting: Internal Medicine

## 2023-07-19 NOTE — Progress Notes (Signed)
 Received the results of the patient's SIBO breath test (collected 07/09/23). Patient's test shows an elevated increase in hydrogen of 49 ppm (high), increase in methane 18 ppm (high), and increase in both hydrogen and methane of 67 ppm (high). 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 Will have these results scanned into the patient's chart.  Pod B triage, please let the patient know that she has intestinal methanogen overgrowth and that I would recommend treatment with neomycin 500 mg BID and rifaximin 550 mg TID for 14 days.

## 2023-07-20 ENCOUNTER — Telehealth: Payer: Self-pay

## 2023-07-20 ENCOUNTER — Other Ambulatory Visit: Payer: Self-pay

## 2023-07-20 ENCOUNTER — Other Ambulatory Visit (HOSPITAL_COMMUNITY): Payer: Self-pay

## 2023-07-20 NOTE — Progress Notes (Signed)
 PA request has been Submitted. New Encounter has been or will be created for follow up. For additional info see Pharmacy Prior Auth telephone encounter from 07-20-2023.

## 2023-07-20 NOTE — Telephone Encounter (Signed)
 Pharmacy Patient Advocate Encounter   Received notification from Pt Calls Messages that prior authorization for Xifaxan 550MG  tablets is required/requested.   Insurance verification completed.   The patient is insured through [facility] .   Per test claim: PA required; PA submitted to above mentioned insurance via CoverMyMeds Key/confirmation #/EOC [facility] Status is pending

## 2023-07-20 NOTE — Progress Notes (Signed)
 Called and spoke with patient regarding + SIBO breath test results. Patient is aware of treatment plan of Neomycin 500 mg BID and Rifaximin 550 TID for 14 days. I informed patient that Rifaximin may need PA. We will send to pharmacy PA team for assistance. Will notify patient via MyChart once we have a response from PA team. Allowed time for questions. Patient verbalized understanding and had no concerns at the end of the call.

## 2023-07-20 NOTE — Progress Notes (Signed)
 PA team - please start PA for Rifaximin 550 mg TID for 14 days. Thank you

## 2023-07-21 NOTE — Telephone Encounter (Signed)
 Pharmacy Patient Advocate Encounter  Received notification from [facility] that Prior Authorization for Xifaxan 550MG  tablets has been APPROVED from 07-20-2023 to 08-19-2023   PA #/Case ID/Reference #: [id number]

## 2023-07-21 NOTE — Telephone Encounter (Signed)
 Pt made aware of the PA approval. Pt verbalized understanding with all questions answered.

## 2023-07-27 ENCOUNTER — Other Ambulatory Visit: Payer: Self-pay

## 2023-07-29 ENCOUNTER — Other Ambulatory Visit: Payer: Self-pay

## 2023-07-29 ENCOUNTER — Other Ambulatory Visit (HOSPITAL_COMMUNITY): Payer: Self-pay

## 2023-07-29 ENCOUNTER — Encounter: Payer: Self-pay | Admitting: Internal Medicine

## 2023-07-29 MED ORDER — NEOMYCIN SULFATE 500 MG PO TABS
500.0000 mg | ORAL_TABLET | Freq: Two times a day (BID) | ORAL | 0 refills | Status: AC
Start: 2023-07-29 — End: 2023-08-12
  Filled 2023-07-29: qty 28, 14d supply, fill #0

## 2023-07-29 MED ORDER — RIFAXIMIN 550 MG PO TABS
550.0000 mg | ORAL_TABLET | Freq: Three times a day (TID) | ORAL | 0 refills | Status: AC
  Filled 2023-07-29: qty 42, 14d supply, fill #0

## 2023-08-27 ENCOUNTER — Other Ambulatory Visit: Payer: Self-pay

## 2023-10-06 ENCOUNTER — Ambulatory Visit (INDEPENDENT_AMBULATORY_CARE_PROVIDER_SITE_OTHER): Admitting: Internal Medicine

## 2023-10-06 ENCOUNTER — Encounter: Payer: Self-pay | Admitting: Internal Medicine

## 2023-10-06 VITALS — BP 118/68 | HR 65 | Ht 68.0 in | Wt 151.0 lb

## 2023-10-06 DIAGNOSIS — Z8639 Personal history of other endocrine, nutritional and metabolic disease: Secondary | ICD-10-CM | POA: Diagnosis not present

## 2023-10-06 DIAGNOSIS — Z8719 Personal history of other diseases of the digestive system: Secondary | ICD-10-CM

## 2023-10-06 DIAGNOSIS — K31A0 Gastric intestinal metaplasia, unspecified: Secondary | ICD-10-CM | POA: Diagnosis not present

## 2023-10-06 DIAGNOSIS — R1030 Lower abdominal pain, unspecified: Secondary | ICD-10-CM

## 2023-10-06 DIAGNOSIS — R197 Diarrhea, unspecified: Secondary | ICD-10-CM

## 2023-10-06 NOTE — Progress Notes (Signed)
 Chief Complaint: Ab pain  HPI : 38 year old female with history of sicca syndrome, migraines, and depression presents for follow-up of ab pain and diarrhea  Low FODMAP diet did not help when she tried it for several days. She was tested for celiac disease, which was negative. She has a history of vitamin B12 and iron  deficiency. Bentyl  did not really help with her symptoms. 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 Denies any further lower abdominal pain. She does still sometimes have loose stools but on average she has 1-2 BMs per day, which is improved from prior. She is not taking colestipol . She has not needed to take her omeprazole  because she really only has symptoms when she eats certain types of foods.   Past Medical History:  Diagnosis Date   Dizziness    goes along w/my migraines   Family history of adverse reaction to anesthesia    Mom is overweight, has sleep apnea; has breathing issues w/anesthesia   History of chicken pox    History of depression    Ludwig's angina 05/30/2015   Lumbar vertebral fracture (HCC) 2004   sports related; L5; no OR   Migraine headache    monthly even w/my RX (05/31/2015)   Mononucleosis dx'd 05/2015   Pneumonia ~ 2010   Sicca syndrome (HCC) 10/04/2015   Sicca syndrome with polyarthralgia. Established with Dr Ishmael rheum - glandular without systemic manifestations - continue topical treatment, no need for systemic therapy at this time. ANA neg, ESR 2, CRP 0.6, anti CCP 5 (10/2015)    Spondylolysis of lumbosacral region 2004   chronic R L5   Thyroid  nodule 01/23/2016   Right lower thyroid  gland 6 mm nodule by CT (05/2015)     Past Surgical History:  Procedure Laterality Date   brain MRI  11/2013   WNL, no demyelinating disease   DILATION AND CURETTAGE OF UTERUS N/A 09/27/2017   Procedure: DILATATION AND  CURETTAGE with ultrasound guidance;  Surgeon: Mat Browning, MD;  Location: [facility];  Service: Gynecology;  Laterality: N/A;   DILATION AND CURETTAGE OF UTERUS  2019   evoked brainstem auditory response  2014   WNL (GNA)   WISDOM TOOTH EXTRACTION  2008   Family History  Problem Relation Age of Onset   Hypertension Mother    Cancer Mother 78       breast   Stroke Father 37       unclear cause - small vessel disease   Hypertension Father    Alzheimer's disease Maternal Uncle 50       early onset   Cancer Paternal Uncle        prostate   Stroke Paternal Uncle 52   Diabetes Paternal Uncle    CAD Maternal Grandmother 66   Stroke Paternal Grandmother 45       multiple    Cancer Paternal Grandfather        prostate   Esophageal cancer Neg Hx    Colon cancer Neg Hx    Rectal cancer Neg Hx    Stomach cancer Neg Hx    Social History   Tobacco Use   Smoking status: Never   Smokeless tobacco: Never  Vaping Use   Vaping status: Never Used  Substance Use Topics   Alcohol use: No    Alcohol/week: 0.0 standard drinks of alcohol    Comment:  occasionally   Drug use: No   Current Outpatient Medications  Medication Sig Dispense Refill   colestipol  (COLESTID ) 1 g tablet Take 2 tablets (2 g total) by mouth daily. 180 tablet 1   Cyanocobalamin  (B-12) 1000 MCG SUBL Place 1 tablet under the tongue daily.     Norethindrone  Acetate-Ethinyl Estrad-FE (BLISOVI  24 FE) 1-20 MG-MCG(24) tablet Take 1 tablet by mouth daily. 84 tablet 4   omeprazole  (PRILOSEC) 40 MG capsule Take 1 capsule (40 mg total) by mouth daily. 90 capsule 3   Prenatal Vit-Fe Fumarate-FA (PRENATAL VITAMIN PLUS LOW IRON ) 27-1 MG TABS Take 1 tablet by mouth daily. 30 tablet    dicyclomine  (BENTYL ) 10 MG capsule Take 1 capsule (10 mg total) by mouth 3 (three) times daily as needed. (Patient not taking: Reported on 01/13/2023) 90 capsule 0   No current facility-administered medications for this visit.   No Known  Allergies  Physical Exam: BP 118/68   Pulse 65   Ht 5' 8 (1.727 m)   Wt 151 lb (68.5 kg)   BMI 22.96 kg/m  Constitutional: Pleasant,well-developed, female in no acute distress. HEENT: Normocephalic and atraumatic. Conjunctivae are normal. No scleral icterus. Cardiovascular: Normal rate, regular rhythm.  Pulmonary/chest: Effort normal and breath sounds normal. No wheezing, rales or rhonchi. Abdominal: Soft, nondistended, nontender. Bowel sounds active throughout. There are no masses palpable. No hepatomegaly. Extremities: No edema Neurological: Alert and oriented to person place and time. Skin: Skin is warm and dry. No rashes noted. Psychiatric: Normal mood and affect. Behavior is normal.  Labs 10/2021: CBC normal.  CMP unremarkable.  Vit B12 level low at 208.  TSH normal.  Labs 12/2021: Celiac panel was negative.  Intrinsic factor antibody was negative.  Vitamin B12 normal.  Ferritin 10.  Iron /TIBC unremarkable.  Labs 09/2022: CMP with elevated T bili 1.7. CBC nml. Ferritin 32.4.  Vitamin B12 normal.  CRP normal.  ESR normal.  Hepatitis C antibody negative.  Labs 12/2022: LFTs nml. LDH nml. Haptoglobin nml.   Labs 12/2022: Alpha gal negative.  SIBO breath test 07/09/23 - Patient's test shows an elevated increase in hydrogen of 49 ppm (high), increase in methane 18 ppm (high), and increase in both hydrogen and methane of 67 ppm (high). 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   RUQ U/S 01/07/23: IMPRESSION: Mild gallbladder wall thickening without cholelithiasis or pericholecystic fluid. Negative sonographic Murphy's sign.  HIDA scan 07/18/23: IMPRESSION: 1.  Patent cystic and common bile ducts. 2.  Normal gallbladder ejection fraction.  EGD 01/13/23: - Normal esophagus. - Erosive gastropathy with no bleeding and no stigmata of recent bleeding. Biopsied. - Normal examined duodenum. Biopsied. Path: 1. Surgical [P], duodenal :       -  DUODENAL  MUCOSA WITH PROMINENT BRUNNER'S GLANDS AND VERY FOCAL FOVEOLAR       METAPLASIA SUGGESTIVE OF CHRONIC PEPTIC DUODENITIS.       2. Surgical [P], gastric :       -  ANTRAL-TYPE MUCOSA WITH MILD CHRONIC INACTIVE GASTRITIS AND FOCAL INTESTINAL METAPLASIA, SUGGESTIVE OF ATROPHY.       -  SEPARATE FRAGMENTS OF OXYNTIC MUCOSA WITH NO SIGNIFICANT PATHOLOGY.       -  IHC for H pylori was negative  ASSESSMENT AND PLAN: Diarrhea - improved Lower abdominal pain - resolved Gastric intestinal metaplasia History of intestinal methanogen overgrowth History of vitamin B12 and iron  deficiency Patient has had significant improvement in her symptoms after undergoing treatment for intestinal methanogen overgrowth with  rifaximin /neomycin . Her ab pain has resolved. Her diarrhea has also improved. She is not on any medications for diarrhea currently. Her recent HIDA scan was normal, suggesting against a gallbladder pathology. She has not required PPI therapy and instead manages her indigestion with diet. Patient was noted to have GIM on her last EGD in 12/2022 so will plan for gastric mapping in the future. - Next EGD [facility] due in 12/2023 for gastric mapping of GIM - RTC PRN  Estefana Kidney, MD  I spent 28 minutes of time, including in depth chart review, independent review of results as outlined above, communicating results with the patient directly, face-to-face time with the patient, coordinating care, ordering studies and medications as appropriate, and documentation.

## 2023-10-06 NOTE — Patient Instructions (Addendum)
 Follow up as needed   If your blood pressure at your visit was 140/90 or greater, please contact your primary care physician to follow up on this.  _______________________________________________________  If you are age 37 or older, your body mass index should be between 23-30. Your Body mass index is 22.96 kg/m. If this is out of the aforementioned range listed, please consider follow up with your Primary Care Provider.  If you are age 21 or younger, your body mass index should be between 19-25. Your Body mass index is 22.96 kg/m. If this is out of the aformentioned range listed, please consider follow up with your Primary Care Provider.   ________________________________________________________  The [place] [facility] providers would like to encourage you to use MYCHART to communicate with providers for non-urgent requests or questions.  Due to long hold times on the telephone, sending your provider a message by [facility] may be a faster and more efficient way to get a response.  Please allow 48 business hours for a response.  Please remember that this is for non-urgent requests.  _______________________________________________________  Thank you for entrusting me with your care and for choosing [facility], Dr. Estefana Kidney

## 2023-11-17 ENCOUNTER — Other Ambulatory Visit (HOSPITAL_COMMUNITY): Payer: Self-pay

## 2023-12-31 ENCOUNTER — Other Ambulatory Visit (HOSPITAL_BASED_OUTPATIENT_CLINIC_OR_DEPARTMENT_OTHER): Payer: Self-pay

## 2023-12-31 MED ORDER — FLUZONE 0.5 ML IM SUSY
0.5000 mL | PREFILLED_SYRINGE | Freq: Once | INTRAMUSCULAR | 0 refills | Status: AC
  Filled 2023-12-31: qty 0.5, 1d supply, fill #0

## 2024-02-10 ENCOUNTER — Other Ambulatory Visit (HOSPITAL_COMMUNITY): Payer: Self-pay

## 2024-03-28 ENCOUNTER — Other Ambulatory Visit (HOSPITAL_COMMUNITY): Payer: Self-pay

## 2024-03-28 ENCOUNTER — Ambulatory Visit: Admitting: Family Medicine

## 2024-03-28 ENCOUNTER — Encounter: Payer: Self-pay | Admitting: Family Medicine

## 2024-03-28 VITALS — BP 102/70 | HR 69 | Temp 97.9°F | Ht 68.0 in | Wt 148.4 lb

## 2024-03-28 DIAGNOSIS — B360 Pityriasis versicolor: Secondary | ICD-10-CM

## 2024-03-28 DIAGNOSIS — L709 Acne, unspecified: Secondary | ICD-10-CM | POA: Diagnosis not present

## 2024-03-28 MED ORDER — FLUCONAZOLE 150 MG PO TABS
300.0000 mg | ORAL_TABLET | ORAL | 0 refills | Status: AC
  Filled 2024-03-28: qty 4, 14d supply, fill #0

## 2024-03-28 MED ORDER — TRETINOIN 0.025 % EX CREA
TOPICAL_CREAM | Freq: Every evening | CUTANEOUS | 1 refills | Status: AC | PRN
  Filled 2024-03-28: qty 20, 30d supply, fill #0

## 2024-03-28 NOTE — Assessment & Plan Note (Signed)
 Story/exam again suggestive of tinea versicolor.  Rx diflucan  300mg  weekly x 2 weeks.  Reviewed maintenance OTC treatment ie nizoral shampoo.  Update if recurrent or persistent.

## 2024-03-28 NOTE — Progress Notes (Signed)
 " Ph: [phone] Fax: [phone]   Patient ID: Samantha Norman, female    DOB: 05-20-1985, 38 y.o.   MRN: [id number]  This visit was conducted in person.  BP 102/70 (BP Location: Left Arm, Patient Position: Sitting, Cuff Size: Normal)   Pulse 69   Temp 97.9 F (36.6 C) (Oral)   Ht 5' 8 (1.727 m)   Wt 148 lb 6.4 oz (67.3 kg)   SpO2 99%   BMI 22.56 kg/m    CC: skin rash  Subjective:   HPI: Samantha Norman is a 38 y.o. female presenting on 03/28/2024 for Rash (Rash starting on back has spread to front of pt's torso, chronic rash but flare up began in October but getting progressively worse/Flat red clusters of dots that itch/Pt has hx of yeast rash)   Recurrent rash to back over the summer then spread to torso over fall.  Somewhat itchy.  No redness, discharge or pain.   No new products.  No new medicines.  No new foods.   H/o recurrent skin rash. Saw dermatology - told fungal rash.   She has been working out more.   Dx SIBO treated with abx rifaximin /neomycin .   H/o sicca syndrome has seen rheum Dr Ishmael.   Some intermittent acne  requests trial of retin-A. She has been using OTC adapalene and would like stronger treatment.      Relevant past medical, surgical, family and social history reviewed and updated as indicated. Interim medical history since our last visit reviewed. Allergies and medications reviewed and updated. Outpatient Medications Prior to Visit  Medication Sig Dispense Refill   Cyanocobalamin  (B-12) 1000 MCG SUBL Place 1 tablet under the tongue daily.     Norethindrone  Acetate-Ethinyl Estrad-FE (BLISOVI  24 FE) 1-20 MG-MCG(24) tablet Take 1 tablet by mouth daily. 84 tablet 4   Prenatal Vit-Fe Fumarate-FA (PRENATAL VITAMIN PLUS LOW IRON ) 27-1 MG TABS Take 1 tablet by mouth daily. 30 tablet    colestipol  (COLESTID ) 1 g tablet Take 2 tablets (2 g total) by mouth daily. (Patient not taking: Reported on 03/28/2024) 180 tablet 1   dicyclomine   (BENTYL ) 10 MG capsule Take 1 capsule (10 mg total) by mouth 3 (three) times daily as needed. (Patient not taking: Reported on 03/28/2024) 90 capsule 0   omeprazole  (PRILOSEC) 40 MG capsule Take 1 capsule (40 mg total) by mouth daily. (Patient not taking: Reported on 03/28/2024) 90 capsule 3   No facility-administered medications prior to visit.     Per HPI unless specifically indicated in ROS section below Review of Systems  Objective:  BP 102/70 (BP Location: Left Arm, Patient Position: Sitting, Cuff Size: Normal)   Pulse 69   Temp 97.9 F (36.6 C) (Oral)   Ht 5' 8 (1.727 m)   Wt 148 lb 6.4 oz (67.3 kg)   SpO2 99%   BMI 22.56 kg/m   Wt Readings from Last 3 Encounters:  03/28/24 148 lb 6.4 oz (67.3 kg)  10/06/23 151 lb (68.5 kg)  07/07/23 154 lb (69.9 kg)      Physical Exam Vitals and nursing note reviewed.  Constitutional:      Appearance: Normal appearance. She is not ill-appearing.  Skin:    General: Skin is warm and dry.     Findings: Erythema and rash present.     Comments: Diffuse erythematous annular macular rash throughout back abdomen and chest, mildly pruritic   Neurological:     Mental Status: She is alert.  Assessment & Plan:   Problem List Items Addressed This Visit     Tinea versicolor - Primary   Story/exam again suggestive of tinea versicolor.  Rx diflucan  300mg  weekly x 2 weeks.  Reviewed maintenance OTC treatment ie nizoral shampoo.  Update if recurrent or persistent.       Relevant Medications   fluconazole  (DIFLUCAN ) 150 MG tablet   Acne   Mild, intermittent.  Requests Retin-A for PRN use which is reasonable.  Reviewed skin sensitivity precautions.       Relevant Medications   tretinoin (RETIN-A) 0.025 % cream     Meds ordered this encounter  Medications   fluconazole  (DIFLUCAN ) 150 MG tablet    Sig: Take 2 tablets (300 mg total) by mouth once a week. For 2 weeks    Dispense:  4 tablet    Refill:  0   tretinoin (RETIN-A)  0.025 % cream    Sig: Apply topically at bedtime as needed.    Dispense:  20 g    Refill:  1    No orders of the defined types were placed in this encounter.   Patient Instructions  Take diflucan  weekly which I have sent to pharmacy May use antifungal shampoo on body 1-2 times a week for maintenance after treatment.   Follow up plan: No follow-ups on file.  Anton Blas, MD   "

## 2024-03-28 NOTE — Assessment & Plan Note (Signed)
 Mild, intermittent.  Requests Retin-A for PRN use which is reasonable.  Reviewed skin sensitivity precautions.

## 2024-03-28 NOTE — Patient Instructions (Addendum)
 Take diflucan  weekly which I have sent to pharmacy May use antifungal shampoo on body 1-2 times a week for maintenance after treatment.

## 2024-04-06 ENCOUNTER — Other Ambulatory Visit (HOSPITAL_COMMUNITY): Payer: Self-pay

## 2024-04-06 MED ORDER — BLISOVI 24 FE 1-20 MG-MCG(24) PO TABS
1.0000 | ORAL_TABLET | Freq: Every day | ORAL | 4 refills | Status: AC
  Filled 2024-04-30: qty 84, 84d supply, fill #0

## 2024-04-30 ENCOUNTER — Other Ambulatory Visit (HOSPITAL_COMMUNITY): Payer: Self-pay

## 2024-04-30 ENCOUNTER — Other Ambulatory Visit: Payer: Self-pay
